# Patient Record
Sex: Female | Born: 1970
Health system: Southern US, Community
[De-identification: ages and names within clinical notes are randomized; demographics above are authoritative.]

## PROBLEM LIST (undated history)

## (undated) DIAGNOSIS — M2550 Pain in unspecified joint: Secondary | ICD-10-CM

## (undated) DIAGNOSIS — M6289 Other specified disorders of muscle: Secondary | ICD-10-CM

## (undated) DIAGNOSIS — M1990 Unspecified osteoarthritis, unspecified site: Secondary | ICD-10-CM

## (undated) DIAGNOSIS — I10 Essential (primary) hypertension: Secondary | ICD-10-CM

## (undated) DIAGNOSIS — G479 Sleep disorder, unspecified: Secondary | ICD-10-CM

## (undated) DIAGNOSIS — F439 Reaction to severe stress, unspecified: Secondary | ICD-10-CM

## (undated) DIAGNOSIS — M7989 Other specified soft tissue disorders: Secondary | ICD-10-CM

## (undated) DIAGNOSIS — R0602 Shortness of breath: Secondary | ICD-10-CM

## (undated) DIAGNOSIS — I809 Phlebitis and thrombophlebitis of unspecified site: Secondary | ICD-10-CM

## (undated) DIAGNOSIS — M25569 Pain in unspecified knee: Secondary | ICD-10-CM

## (undated) DIAGNOSIS — M549 Dorsalgia, unspecified: Secondary | ICD-10-CM

## (undated) DIAGNOSIS — E785 Hyperlipidemia, unspecified: Secondary | ICD-10-CM

## (undated) DIAGNOSIS — D649 Anemia, unspecified: Secondary | ICD-10-CM

## (undated) DIAGNOSIS — F32A Depression, unspecified: Secondary | ICD-10-CM

## (undated) DIAGNOSIS — R011 Cardiac murmur, unspecified: Secondary | ICD-10-CM

## (undated) DIAGNOSIS — K760 Fatty (change of) liver, not elsewhere classified: Secondary | ICD-10-CM

## (undated) DIAGNOSIS — G4730 Sleep apnea, unspecified: Secondary | ICD-10-CM

## (undated) DIAGNOSIS — K219 Gastro-esophageal reflux disease without esophagitis: Secondary | ICD-10-CM

## (undated) DIAGNOSIS — F419 Anxiety disorder, unspecified: Secondary | ICD-10-CM

## (undated) DIAGNOSIS — F329 Major depressive disorder, single episode, unspecified: Secondary | ICD-10-CM

## (undated) HISTORY — DX: Major depressive disorder, single episode, unspecified: F32.9

## (undated) HISTORY — DX: Pain in unspecified knee: M25.569

## (undated) HISTORY — DX: Gastro-esophageal reflux disease without esophagitis: K21.9

## (undated) HISTORY — DX: Depression, unspecified: F32.A

## (undated) HISTORY — DX: Sleep apnea, unspecified: G47.30

## (undated) HISTORY — DX: Phlebitis and thrombophlebitis of unspecified site: I80.9

## (undated) HISTORY — DX: Other specified soft tissue disorders: M79.89

## (undated) HISTORY — DX: Other specified disorders of muscle: M62.89

## (undated) HISTORY — DX: Hyperlipidemia, unspecified: E78.5

## (undated) HISTORY — DX: Reaction to severe stress, unspecified: F43.9

## (undated) HISTORY — PX: COLONOSCOPY: SHX174

## (undated) HISTORY — DX: Sleep disorder, unspecified: G47.9

## (undated) HISTORY — DX: Dorsalgia, unspecified: M54.9

## (undated) HISTORY — DX: Cardiac murmur, unspecified: R01.1

## (undated) HISTORY — DX: Shortness of breath: R06.02

## (undated) HISTORY — PX: WISDOM TOOTH EXTRACTION: SHX21

## (undated) HISTORY — DX: Pain in unspecified joint: M25.50

## (undated) HISTORY — DX: Anemia, unspecified: D64.9

## (undated) HISTORY — DX: Unspecified osteoarthritis, unspecified site: M19.90

## (undated) HISTORY — DX: Fatty (change of) liver, not elsewhere classified: K76.0

## (undated) HISTORY — PX: OVARY SURGERY: SHX727

---

## 1998-06-26 ENCOUNTER — Other Ambulatory Visit: Admission: RE | Admit: 1998-06-26 | Discharge: 1998-06-26 | Payer: Self-pay | Admitting: *Deleted

## 1998-10-06 ENCOUNTER — Inpatient Hospital Stay (HOSPITAL_COMMUNITY): Admission: AD | Admit: 1998-10-06 | Discharge: 1998-10-06 | Payer: Self-pay | Admitting: Obstetrics and Gynecology

## 1998-11-11 ENCOUNTER — Emergency Department (HOSPITAL_COMMUNITY): Admission: EM | Admit: 1998-11-11 | Discharge: 1998-11-11 | Payer: Self-pay | Admitting: Emergency Medicine

## 1999-01-15 ENCOUNTER — Inpatient Hospital Stay (HOSPITAL_COMMUNITY): Admission: AD | Admit: 1999-01-15 | Discharge: 1999-01-17 | Payer: Self-pay | Admitting: Anesthesiology

## 1999-01-18 ENCOUNTER — Encounter: Admission: RE | Admit: 1999-01-18 | Discharge: 1999-04-18 | Payer: Self-pay | Admitting: Obstetrics and Gynecology

## 1999-02-25 ENCOUNTER — Other Ambulatory Visit: Admission: RE | Admit: 1999-02-25 | Discharge: 1999-02-25 | Payer: Self-pay | Admitting: *Deleted

## 2000-10-25 ENCOUNTER — Ambulatory Visit (HOSPITAL_COMMUNITY): Admission: RE | Admit: 2000-10-25 | Discharge: 2000-10-25 | Payer: Self-pay | Admitting: Obstetrics and Gynecology

## 2000-10-25 ENCOUNTER — Encounter: Payer: Self-pay | Admitting: Obstetrics and Gynecology

## 2001-04-18 ENCOUNTER — Inpatient Hospital Stay (HOSPITAL_COMMUNITY): Admission: AD | Admit: 2001-04-18 | Discharge: 2001-04-18 | Payer: Self-pay | Admitting: Obstetrics and Gynecology

## 2001-04-20 ENCOUNTER — Inpatient Hospital Stay (HOSPITAL_COMMUNITY): Admission: AD | Admit: 2001-04-20 | Discharge: 2001-04-20 | Payer: Self-pay | Admitting: Obstetrics and Gynecology

## 2001-04-24 ENCOUNTER — Inpatient Hospital Stay (HOSPITAL_COMMUNITY): Admission: AD | Admit: 2001-04-24 | Discharge: 2001-04-25 | Payer: Self-pay | Admitting: Obstetrics and Gynecology

## 2001-04-24 ENCOUNTER — Encounter (INDEPENDENT_AMBULATORY_CARE_PROVIDER_SITE_OTHER): Payer: Self-pay

## 2001-04-26 ENCOUNTER — Encounter: Admission: RE | Admit: 2001-04-26 | Discharge: 2001-05-26 | Payer: Self-pay | Admitting: *Deleted

## 2001-05-30 ENCOUNTER — Other Ambulatory Visit: Admission: RE | Admit: 2001-05-30 | Discharge: 2001-05-30 | Payer: Self-pay | Admitting: *Deleted

## 2001-11-17 ENCOUNTER — Other Ambulatory Visit: Admission: RE | Admit: 2001-11-17 | Discharge: 2001-11-17 | Payer: Self-pay | Admitting: *Deleted

## 2002-07-12 ENCOUNTER — Other Ambulatory Visit: Admission: RE | Admit: 2002-07-12 | Discharge: 2002-07-12 | Payer: Self-pay | Admitting: *Deleted

## 2002-07-17 ENCOUNTER — Encounter: Payer: Self-pay | Admitting: *Deleted

## 2002-07-17 ENCOUNTER — Ambulatory Visit (HOSPITAL_COMMUNITY): Admission: RE | Admit: 2002-07-17 | Discharge: 2002-07-17 | Payer: Self-pay | Admitting: *Deleted

## 2003-01-17 ENCOUNTER — Emergency Department (HOSPITAL_COMMUNITY): Admission: EM | Admit: 2003-01-17 | Discharge: 2003-01-18 | Payer: Self-pay | Admitting: Emergency Medicine

## 2003-03-20 ENCOUNTER — Other Ambulatory Visit: Admission: RE | Admit: 2003-03-20 | Discharge: 2003-03-20 | Payer: Self-pay | Admitting: *Deleted

## 2003-05-22 ENCOUNTER — Ambulatory Visit (HOSPITAL_COMMUNITY): Admission: RE | Admit: 2003-05-22 | Discharge: 2003-05-22 | Payer: Self-pay | Admitting: Family Medicine

## 2004-03-03 ENCOUNTER — Emergency Department (HOSPITAL_COMMUNITY): Admission: EM | Admit: 2004-03-03 | Discharge: 2004-03-03 | Payer: Self-pay | Admitting: *Deleted

## 2004-08-06 ENCOUNTER — Other Ambulatory Visit: Admission: RE | Admit: 2004-08-06 | Discharge: 2004-08-06 | Payer: Self-pay | Admitting: *Deleted

## 2005-03-24 ENCOUNTER — Other Ambulatory Visit: Admission: RE | Admit: 2005-03-24 | Discharge: 2005-03-24 | Payer: Self-pay | Admitting: Family Medicine

## 2005-10-21 ENCOUNTER — Emergency Department (HOSPITAL_COMMUNITY): Admission: EM | Admit: 2005-10-21 | Discharge: 2005-10-21 | Payer: Self-pay | Admitting: Emergency Medicine

## 2005-10-21 ENCOUNTER — Ambulatory Visit: Payer: Self-pay | Admitting: Family Medicine

## 2005-12-09 ENCOUNTER — Ambulatory Visit: Payer: Self-pay | Admitting: Family Medicine

## 2006-01-20 ENCOUNTER — Ambulatory Visit: Payer: Self-pay | Admitting: Internal Medicine

## 2006-07-12 ENCOUNTER — Ambulatory Visit: Payer: Self-pay | Admitting: Family Medicine

## 2006-07-12 LAB — CONVERTED CEMR LAB
Basophils Absolute: 0 10*3/uL (ref 0.0–0.1)
Basophils Relative: 0.5 % (ref 0.0–1.0)
Cholesterol: 209 mg/dL (ref 0–200)
Direct LDL: 111 mg/dL
Eosinophils Absolute: 0.1 10*3/uL (ref 0.0–0.6)
Eosinophils Relative: 0.8 % (ref 0.0–5.0)
Glucose, Bld: 84 mg/dL (ref 70–99)
HCT: 43.8 % (ref 36.0–46.0)
HDL: 70.7 mg/dL (ref >39.0)
Hemoglobin: 15.2 g/dL — ABNORMAL HIGH (ref 12.0–15.0)
Lymphocytes Relative: 20.9 % (ref 12.0–46.0)
MCHC: 34.6 g/dL (ref 30.0–36.0)
MCV: 93.4 fL (ref 78.0–100.0)
Monocytes Absolute: 0.3 10*3/uL (ref 0.2–0.7)
Monocytes Relative: 3.3 % (ref 3.0–11.0)
Neutro Abs: 6.7 10*3/uL (ref 1.4–7.7)
Neutrophils Relative %: 74.5 % (ref 43.0–77.0)
Platelets: 266 10*3/uL (ref 150–400)
RBC: 4.7 M/uL (ref 3.87–5.11)
RDW: 11.8 % (ref 11.5–14.6)
TSH: 2.36 microintl units/mL (ref 0.35–5.50)
Total CHOL/HDL Ratio: 3
Triglycerides: 189 mg/dL — ABNORMAL HIGH (ref 0–149)
VLDL: 38 mg/dL (ref 0–40)
WBC: 9 10*3/uL (ref 4.5–10.5)

## 2006-07-26 DIAGNOSIS — F32A Depression, unspecified: Secondary | ICD-10-CM

## 2006-07-26 DIAGNOSIS — F419 Anxiety disorder, unspecified: Secondary | ICD-10-CM

## 2006-07-26 DIAGNOSIS — F329 Major depressive disorder, single episode, unspecified: Secondary | ICD-10-CM

## 2006-07-26 HISTORY — DX: Depression, unspecified: F32.A

## 2008-05-21 ENCOUNTER — Emergency Department (HOSPITAL_COMMUNITY): Admission: EM | Admit: 2008-05-21 | Discharge: 2008-05-21 | Payer: Self-pay | Admitting: Family Medicine

## 2010-07-03 LAB — POCT I-STAT, CHEM 8
BUN: 8 mg/dL (ref 6–23)
Calcium, Ion: 1.13 mmol/L (ref 1.12–1.32)
Chloride: 101 mEq/L (ref 96–112)
Creatinine, Ser: 0.7 mg/dL (ref 0.4–1.2)
Glucose, Bld: 92 mg/dL (ref 70–99)
HCT: 47 % — ABNORMAL HIGH (ref 36.0–46.0)
Hemoglobin: 16 g/dL — ABNORMAL HIGH (ref 12.0–15.0)
Potassium: 3.7 mEq/L (ref 3.5–5.1)
Sodium: 137 mEq/L (ref 135–145)
TCO2: 27 mmol/L (ref 0–100)

## 2010-07-03 LAB — POCT CARDIAC MARKERS: Myoglobin, poc: 47.9 ng/mL (ref 12–200)

## 2010-07-03 LAB — RAPID URINE DRUG SCREEN, HOSP PERFORMED
Amphetamines: NOT DETECTED
Barbiturates: NOT DETECTED
Benzodiazepines: NOT DETECTED
Cocaine: NOT DETECTED
Opiates: NOT DETECTED
Tetrahydrocannabinol: NOT DETECTED

## 2010-08-08 NOTE — Assessment & Plan Note (Signed)
[facility]                                   ON-CALL NOTE   EMOJEAN, GERTZ                     MRN:          [id number]  DATE:10/20/2005                            DOB:          [birth year]    TELEPHONE MESSAGE:  Husband calling at 7:30 a.m. because his wife has some  skipping of her heart rate.  Advised to take her to the office for eval.                                   Tinnie Gens A. Tawanna Cooler, MD   JAT/MedQ  DD:  10/22/2005  DT:  10/22/2005  Job #:  [id number]   cc:   Leanne Chang, MD

## 2010-08-08 NOTE — Assessment & Plan Note (Signed)
[facility]                                   ON-CALL NOTE   Angela Stark, Angela Stark                   MRN:          [id number]  DATE:01/20/2006                            DOB:          18-Nov-1970    ON-CALL MESSAGE   Dr. Leta Jungling patient, [phone].  I think she is 40 years old.  Brynda Greathouse, her  husband, calls.  The patient was seen by Dr. Drue Novel and was diagnosed with  bronchitis,  was given azithromycin, and a Ventolin inhaler.  Since that  time, she apparently has not improved.  There is no associated fever, but  has coughing fits and keeps complaining to her husband of being short of  breath.  She discouraged calling the on-call physician, but her husband was  concerned because she kept saying she was short of breath.  He states that  her color looks pretty good, but she complains of being short of breath.  She has no history of asthma but she does smoke regularly and does have a  child with mild asthma.  In fact, they have a nebulizer at home with  albuterol, but she has not used that for herself.  When talking to her, she  is able to speak.  She is mildly hoarse, but not breathless.  I have  discussed with her, her symptoms.  She can use a nebulizer and if her  shortness of breath is continuing or getting worse, she should be evaluated  in the emergency room tonight.  Otherwise, she should follow up with Dr. Drue Novel  and agree with her decision to actually stop smoking.    ______________________________  Neta Mends. Fabian Sharp, MD    WKP/MedQ  DD: 01/20/2006  DT: 01/21/2006  Job #: [id number]   cc:   Willow Ora, MD

## 2010-10-06 ENCOUNTER — Other Ambulatory Visit: Payer: Self-pay | Admitting: Family Medicine

## 2010-10-06 DIAGNOSIS — N6452 Nipple discharge: Secondary | ICD-10-CM

## 2010-10-06 DIAGNOSIS — N6310 Unspecified lump in the right breast, unspecified quadrant: Secondary | ICD-10-CM

## 2010-10-08 ENCOUNTER — Ambulatory Visit
Admission: RE | Admit: 2010-10-08 | Discharge: 2010-10-08 | Disposition: A | Discharge status: Home / Self Care | Payer: BC Managed Care – PPO | Source: Ambulatory Visit | Attending: Family Medicine | Admitting: Family Medicine

## 2010-10-08 ENCOUNTER — Other Ambulatory Visit: Payer: Self-pay | Admitting: Family Medicine

## 2010-10-08 DIAGNOSIS — N6310 Unspecified lump in the right breast, unspecified quadrant: Secondary | ICD-10-CM

## 2010-10-08 DIAGNOSIS — N6452 Nipple discharge: Secondary | ICD-10-CM

## 2010-10-13 ENCOUNTER — Other Ambulatory Visit: Payer: Self-pay | Admitting: Family Medicine

## 2010-10-13 DIAGNOSIS — N6452 Nipple discharge: Secondary | ICD-10-CM

## 2010-10-13 DIAGNOSIS — N6009 Solitary cyst of unspecified breast: Secondary | ICD-10-CM

## 2010-11-28 ENCOUNTER — Inpatient Hospital Stay: Admission: RE | Admit: 2010-11-28 | Payer: BC Managed Care – PPO | Source: Ambulatory Visit

## 2011-08-17 ENCOUNTER — Encounter (HOSPITAL_COMMUNITY): Payer: Self-pay | Admitting: *Deleted

## 2011-08-17 ENCOUNTER — Inpatient Hospital Stay (HOSPITAL_COMMUNITY)
Admission: EM | Admit: 2011-08-17 | Discharge: 2011-08-21 | DRG: 433 | Disposition: A | Discharge status: Home / Self Care | Payer: Self-pay | Attending: Family Medicine | Admitting: Family Medicine

## 2011-08-17 DIAGNOSIS — F1010 Alcohol abuse, uncomplicated: Secondary | ICD-10-CM | POA: Diagnosis present

## 2011-08-17 DIAGNOSIS — E876 Hypokalemia: Secondary | ICD-10-CM | POA: Diagnosis present

## 2011-08-17 DIAGNOSIS — E860 Dehydration: Secondary | ICD-10-CM

## 2011-08-17 DIAGNOSIS — D696 Thrombocytopenia, unspecified: Secondary | ICD-10-CM | POA: Diagnosis present

## 2011-08-17 DIAGNOSIS — N179 Acute kidney failure, unspecified: Secondary | ICD-10-CM | POA: Diagnosis present

## 2011-08-17 DIAGNOSIS — E871 Hypo-osmolality and hyponatremia: Secondary | ICD-10-CM | POA: Diagnosis present

## 2011-08-17 DIAGNOSIS — F1920 Other psychoactive substance dependence, uncomplicated: Secondary | ICD-10-CM | POA: Diagnosis present

## 2011-08-17 DIAGNOSIS — F329 Major depressive disorder, single episode, unspecified: Secondary | ICD-10-CM | POA: Diagnosis present

## 2011-08-17 DIAGNOSIS — K7010 Alcoholic hepatitis without ascites: Secondary | ICD-10-CM

## 2011-08-17 DIAGNOSIS — F1020 Alcohol dependence, uncomplicated: Secondary | ICD-10-CM | POA: Diagnosis present

## 2011-08-17 DIAGNOSIS — R109 Unspecified abdominal pain: Secondary | ICD-10-CM | POA: Diagnosis present

## 2011-08-17 DIAGNOSIS — F10931 Alcohol use, unspecified with withdrawal delirium: Secondary | ICD-10-CM | POA: Diagnosis not present

## 2011-08-17 DIAGNOSIS — F1120 Opioid dependence, uncomplicated: Secondary | ICD-10-CM | POA: Diagnosis present

## 2011-08-17 DIAGNOSIS — D72819 Decreased white blood cell count, unspecified: Secondary | ICD-10-CM | POA: Diagnosis present

## 2011-08-17 DIAGNOSIS — I10 Essential (primary) hypertension: Secondary | ICD-10-CM | POA: Diagnosis present

## 2011-08-17 DIAGNOSIS — T65891A Toxic effect of other specified substances, accidental (unintentional), initial encounter: Secondary | ICD-10-CM | POA: Diagnosis present

## 2011-08-17 DIAGNOSIS — F3289 Other specified depressive episodes: Secondary | ICD-10-CM | POA: Diagnosis present

## 2011-08-17 DIAGNOSIS — T510X4A Toxic effect of ethanol, undetermined, initial encounter: Secondary | ICD-10-CM | POA: Diagnosis present

## 2011-08-17 DIAGNOSIS — D6959 Other secondary thrombocytopenia: Secondary | ICD-10-CM | POA: Diagnosis present

## 2011-08-17 DIAGNOSIS — F17200 Nicotine dependence, unspecified, uncomplicated: Secondary | ICD-10-CM | POA: Diagnosis present

## 2011-08-17 DIAGNOSIS — F411 Generalized anxiety disorder: Secondary | ICD-10-CM | POA: Diagnosis present

## 2011-08-17 DIAGNOSIS — K7030 Alcoholic cirrhosis of liver without ascites: Secondary | ICD-10-CM | POA: Diagnosis present

## 2011-08-17 DIAGNOSIS — F10231 Alcohol dependence with withdrawal delirium: Secondary | ICD-10-CM | POA: Diagnosis present

## 2011-08-17 HISTORY — DX: Anxiety disorder, unspecified: F41.9

## 2011-08-17 HISTORY — DX: Essential (primary) hypertension: I10

## 2011-08-17 LAB — CBC
Hemoglobin: 13.8 g/dL (ref 12.0–15.0)
MCH: 36.1 pg — ABNORMAL HIGH (ref 26.0–34.0)
MCV: 102.1 fL — ABNORMAL HIGH (ref 78.0–100.0)
Platelets: 90 10*3/uL — ABNORMAL LOW (ref 150–400)
RBC: 3.82 MIL/uL — ABNORMAL LOW (ref 3.87–5.11)
WBC: 3.9 10*3/uL — ABNORMAL LOW (ref 4.0–10.5)

## 2011-08-17 LAB — COMPREHENSIVE METABOLIC PANEL
ALT: 108 U/L — ABNORMAL HIGH (ref 0–35)
AST: 205 U/L — ABNORMAL HIGH (ref 0–37)
CO2: 27 mEq/L (ref 19–32)
Calcium: 8.5 mg/dL (ref 8.4–10.5)
Chloride: 76 mEq/L — ABNORMAL LOW (ref 96–112)
Creatinine, Ser: 2.36 mg/dL — ABNORMAL HIGH (ref 0.50–1.10)
GFR calc Af Amer: 29 mL/min — ABNORMAL LOW (ref >90)
GFR calc non Af Amer: 25 mL/min — ABNORMAL LOW (ref >90)
Glucose, Bld: 130 mg/dL — ABNORMAL HIGH (ref 70–99)
Sodium: 123 mEq/L — ABNORMAL LOW (ref 135–145)
Total Bilirubin: 8.8 mg/dL — ABNORMAL HIGH (ref 0.3–1.2)

## 2011-08-17 LAB — DIFFERENTIAL
Basophils Relative: 0 % (ref 0–1)
Eosinophils Relative: 1 % (ref 0–5)
Lymphs Abs: 1 10*3/uL (ref 0.7–4.0)
Monocytes Absolute: 0.4 10*3/uL (ref 0.1–1.0)
Monocytes Relative: 11 % (ref 3–12)

## 2011-08-17 LAB — URINALYSIS, ROUTINE W REFLEX MICROSCOPIC
Nitrite: NEGATIVE
Protein, ur: 30 mg/dL — AB
Urobilinogen, UA: 2 mg/dL — ABNORMAL HIGH (ref 0.0–1.0)

## 2011-08-17 LAB — RAPID URINE DRUG SCREEN, HOSP PERFORMED
Amphetamines: NOT DETECTED
Barbiturates: NOT DETECTED
Cocaine: NOT DETECTED
Opiates: NOT DETECTED
Tetrahydrocannabinol: NOT DETECTED

## 2011-08-17 LAB — URINE MICROSCOPIC-ADD ON

## 2011-08-17 MED ORDER — POTASSIUM CHLORIDE 10 MEQ/100ML IV SOLN
10.0000 meq | Freq: Once | INTRAVENOUS | Status: AC
  Administered 2011-08-18: 10 meq via INTRAVENOUS
  Filled 2011-08-17: qty 100

## 2011-08-17 MED ORDER — LORAZEPAM 1 MG PO TABS
1.0000 mg | ORAL_TABLET | Freq: Four times a day (QID) | ORAL | Status: DC | PRN
  Administered 2011-08-18: 1 mg via ORAL
  Filled 2011-08-17 (×2): qty 1

## 2011-08-17 MED ORDER — ONDANSETRON HCL 4 MG/2ML IJ SOLN
4.0000 mg | Freq: Once | INTRAMUSCULAR | Status: AC
  Administered 2011-08-17: 4 mg via INTRAVENOUS
  Filled 2011-08-17: qty 2

## 2011-08-17 MED ORDER — THIAMINE HCL 100 MG/ML IJ SOLN
100.0000 mg | Freq: Every day | INTRAMUSCULAR | Status: DC
  Filled 2011-08-17: qty 1

## 2011-08-17 MED ORDER — LORAZEPAM 1 MG PO TABS
0.0000 mg | ORAL_TABLET | Freq: Two times a day (BID) | ORAL | Status: DC
Start: 2011-08-19 — End: 2011-08-19

## 2011-08-17 MED ORDER — ADULT MULTIVITAMIN W/MINERALS CH
1.0000 | ORAL_TABLET | Freq: Every day | ORAL | Status: DC
  Administered 2011-08-18 – 2011-08-21 (×4): 1 via ORAL
  Filled 2011-08-17 (×4): qty 1

## 2011-08-17 MED ORDER — DEXTROSE-NACL 5-0.45 % IV SOLN: INTRAVENOUS | Status: DC

## 2011-08-17 MED ORDER — VITAMIN B-1 100 MG PO TABS
100.0000 mg | ORAL_TABLET | Freq: Every day | ORAL | Status: DC
  Administered 2011-08-18: 100 mg via ORAL
  Filled 2011-08-17 (×2): qty 1

## 2011-08-17 MED ORDER — POTASSIUM CHLORIDE 10 MEQ/100ML IV SOLN
10.0000 meq | Freq: Once | INTRAVENOUS | Status: AC
  Administered 2011-08-17: 10 meq via INTRAVENOUS
  Filled 2011-08-17: qty 100

## 2011-08-17 MED ORDER — KCL IN DEXTROSE-NACL 20-5-0.45 MEQ/L-%-% IV SOLN
Freq: Once | INTRAVENOUS | Status: AC
  Administered 2011-08-18: 02:00:00 via INTRAVENOUS
  Filled 2011-08-17: qty 1000

## 2011-08-17 MED ORDER — SODIUM CHLORIDE 0.9 % IV SOLN
Freq: Once | INTRAVENOUS | Status: AC
  Administered 2011-08-17: via INTRAVENOUS

## 2011-08-17 MED ORDER — FOLIC ACID 1 MG PO TABS
1.0000 mg | ORAL_TABLET | Freq: Every day | ORAL | Status: DC
  Administered 2011-08-18: 1 mg via ORAL
  Filled 2011-08-17: qty 1

## 2011-08-17 MED ORDER — LORAZEPAM 1 MG PO TABS
0.0000 mg | ORAL_TABLET | Freq: Four times a day (QID) | ORAL | Status: DC
  Administered 2011-08-18: 1 mg via ORAL
  Filled 2011-08-17: qty 1

## 2011-08-17 MED ORDER — SODIUM CHLORIDE 0.9 % IV BOLUS (SEPSIS)
1000.0000 mL | Freq: Once | INTRAVENOUS | Status: AC
  Administered 2011-08-17: 1000 mL via INTRAVENOUS

## 2011-08-17 MED ORDER — LORAZEPAM 2 MG/ML IJ SOLN
1.0000 mg | Freq: Four times a day (QID) | INTRAMUSCULAR | Status: DC | PRN
  Administered 2011-08-19: 1 mg via INTRAVENOUS
  Filled 2011-08-17: qty 1
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
## 2011-08-17 NOTE — ED Provider Notes (Signed)
History     CSN: [id number]  Arrival date & time 08/17/11  1705   First MD Initiated Contact with Patient 08/17/11 2051      Chief Complaint  Patient presents with  . Abdominal Pain  . Dysuria    (Consider location/radiation/quality/duration/timing/severity/associated sxs/prior treatment) Patient is a 41 y.o. female presenting with abdominal pain and dysuria. The history is provided by the patient.  Abdominal Pain The primary symptoms of the illness include abdominal pain and dysuria.  Dysuria   pt with abd pain and vomiting x 3 days--h/o heavy etoh abuse, notes epigastric pain and diarrhea--also with scleral icterus without fever--nothing makes sx better or worse, no meds taken pta, no h/o same  Past Medical History  Diagnosis Date  . Hypertension   . Anxiety     Past Surgical History  Procedure Date  . Ovary surgery     No family history on file.  History  Substance Use Topics  . Smoking status: Current Some Day Smoker  . Smokeless tobacco: Not on file  . Alcohol Use: Yes     frequent drinks states she is an alcoholic    OB History    Grav Para Term Preterm Abortions TAB SAB Ect Mult Living                  Review of Systems  Gastrointestinal: Positive for abdominal pain.  Genitourinary: Positive for dysuria.  All other systems reviewed and are negative.    Allergies  Review of patient's allergies indicates no known allergies.  Home Medications  No current outpatient prescriptions on file.  BP 141/91  Pulse 83  Temp(Src) 98.6 F (37 C) (Oral)  Resp 20  SpO2 98%  LMP 06/22/2011  Physical Exam  Nursing note and vitals reviewed. Constitutional: She is oriented to person, place, and time. She appears well-developed and well-nourished.  Non-toxic appearance. No distress.  HENT:  Head: Normocephalic and atraumatic.  Eyes: Conjunctivae, EOM and lids are normal. Pupils are equal, round, and reactive to light. Scleral icterus is present.  Neck:  Normal range of motion. Neck supple. No tracheal deviation present. No mass present.  Cardiovascular: Normal rate, regular rhythm and normal heart sounds.  Exam reveals no gallop.   No murmur heard. Pulmonary/Chest: Effort normal and breath sounds normal. No stridor. No respiratory distress. She has no decreased breath sounds. She has no wheezes. She has no rhonchi. She has no rales.  Abdominal: Soft. Normal appearance and bowel sounds are normal. She exhibits no distension. There is no tenderness. There is no rebound and no CVA tenderness.  Musculoskeletal: Normal range of motion. She exhibits no edema and no tenderness.  Neurological: She is alert and oriented to person, place, and time. She has normal strength. No cranial nerve deficit or sensory deficit. GCS eye subscore is 4. GCS verbal subscore is 5. GCS motor subscore is 6.  Skin: Skin is warm and dry. No abrasion and no rash noted.  Psychiatric: She has a normal mood and affect. Her speech is normal and behavior is normal.    ED Course  Procedures (including critical care time)  Labs Reviewed  URINALYSIS, ROUTINE W REFLEX MICROSCOPIC - Abnormal; Notable for the following:    Color, Urine ORANGE (*) BIOCHEMICALS MAY BE AFFECTED BY COLOR   APPearance CLOUDY (*)    Hgb urine dipstick SMALL (*)    Bilirubin Urine LARGE (*)    Ketones, ur TRACE (*)    Protein, ur 30 (*)  Urobilinogen, UA 2.0 (*)    Leukocytes, UA TRACE (*)    All other components within normal limits  POCT PREGNANCY, URINE  URINE MICROSCOPIC-ADD ON  CBC  DIFFERENTIAL  COMPREHENSIVE METABOLIC PANEL  LIPASE, BLOOD  PROTIME-INR  APTT  ETHANOL  URINE RAPID DRUG SCREEN (HOSP PERFORMED)   No results found.   No diagnosis found.    MDM  Patient given IV fluids. Potassium ordered. Spoke with hospitalist and patient be admitted        Toy Baker, MD 08/17/11 2339

## 2011-08-18 ENCOUNTER — Inpatient Hospital Stay (HOSPITAL_COMMUNITY): Payer: Self-pay

## 2011-08-18 ENCOUNTER — Encounter (HOSPITAL_COMMUNITY): Payer: Self-pay | Admitting: Internal Medicine

## 2011-08-18 DIAGNOSIS — K7010 Alcoholic hepatitis without ascites: Secondary | ICD-10-CM

## 2011-08-18 DIAGNOSIS — E876 Hypokalemia: Secondary | ICD-10-CM | POA: Diagnosis present

## 2011-08-18 DIAGNOSIS — E871 Hypo-osmolality and hyponatremia: Secondary | ICD-10-CM

## 2011-08-18 DIAGNOSIS — N179 Acute kidney failure, unspecified: Secondary | ICD-10-CM | POA: Diagnosis present

## 2011-08-18 DIAGNOSIS — D696 Thrombocytopenia, unspecified: Secondary | ICD-10-CM

## 2011-08-18 DIAGNOSIS — E860 Dehydration: Secondary | ICD-10-CM | POA: Diagnosis present

## 2011-08-18 DIAGNOSIS — F1120 Opioid dependence, uncomplicated: Secondary | ICD-10-CM

## 2011-08-18 HISTORY — DX: Opioid dependence, uncomplicated: F11.20

## 2011-08-18 HISTORY — DX: Dehydration: E86.0

## 2011-08-18 HISTORY — DX: Acute kidney failure, unspecified: N17.9

## 2011-08-18 HISTORY — DX: Thrombocytopenia, unspecified: D69.6

## 2011-08-18 HISTORY — DX: Alcoholic hepatitis without ascites: K70.10

## 2011-08-18 HISTORY — DX: Hypo-osmolality and hyponatremia: E87.1

## 2011-08-18 HISTORY — DX: Hypokalemia: E87.6

## 2011-08-18 LAB — CBC
MCHC: 35.5 g/dL (ref 30.0–36.0)
Platelets: 85 10*3/uL — ABNORMAL LOW (ref 150–400)
RDW: 13.9 % (ref 11.5–15.5)
WBC: 2.9 10*3/uL — ABNORMAL LOW (ref 4.0–10.5)

## 2011-08-18 LAB — BASIC METABOLIC PANEL
BUN: 10 mg/dL (ref 6–23)
CO2: 28 mEq/L (ref 19–32)
Calcium: 7.5 mg/dL — ABNORMAL LOW (ref 8.4–10.5)
Calcium: 7.7 mg/dL — ABNORMAL LOW (ref 8.4–10.5)
Chloride: 84 mEq/L — ABNORMAL LOW (ref 96–112)
Creatinine, Ser: 2.04 mg/dL — ABNORMAL HIGH (ref 0.50–1.10)
Creatinine, Ser: 1.86 mg/dL — ABNORMAL HIGH (ref 0.50–1.10)
GFR calc Af Amer: 34 mL/min — ABNORMAL LOW (ref >90)
GFR calc non Af Amer: 29 mL/min — ABNORMAL LOW (ref >90)

## 2011-08-18 LAB — MRSA PCR SCREENING: MRSA by PCR: NEGATIVE

## 2011-08-18 MED ORDER — MORPHINE SULFATE 2 MG/ML IJ SOLN
2.0000 mg | INTRAMUSCULAR | Status: DC | PRN
  Administered 2011-08-19 – 2011-08-20 (×2): 2 mg via INTRAVENOUS
  Filled 2011-08-18 (×2): qty 1

## 2011-08-18 MED ORDER — ONDANSETRON HCL 4 MG/2ML IJ SOLN: 4.0000 mg | Freq: Four times a day (QID) | INTRAMUSCULAR | Status: DC | PRN

## 2011-08-18 MED ORDER — POTASSIUM CHLORIDE 20 MEQ/15ML (10%) PO LIQD: 40.0000 meq | Freq: Once | ORAL | Status: DC

## 2011-08-18 MED ORDER — POTASSIUM CHLORIDE CRYS ER 20 MEQ PO TBCR
40.0000 meq | EXTENDED_RELEASE_TABLET | Freq: Every day | ORAL | Status: DC
  Administered 2011-08-18: 40 meq via ORAL
  Filled 2011-08-18 (×2): qty 2

## 2011-08-18 MED ORDER — SODIUM CHLORIDE 0.9 % IJ SOLN
3.0000 mL | Freq: Two times a day (BID) | INTRAMUSCULAR | Status: DC
  Administered 2011-08-18: 3 mL via INTRAVENOUS

## 2011-08-18 MED ORDER — PANTOPRAZOLE SODIUM 40 MG PO TBEC
40.0000 mg | DELAYED_RELEASE_TABLET | Freq: Every day | ORAL | Status: DC
  Administered 2011-08-18 – 2011-08-21 (×4): 40 mg via ORAL
  Filled 2011-08-18 (×5): qty 1

## 2011-08-18 MED ORDER — ONDANSETRON HCL 4 MG PO TABS: 4.0000 mg | ORAL_TABLET | Freq: Four times a day (QID) | ORAL | Status: DC | PRN

## 2011-08-18 MED ORDER — FOLIC ACID 1 MG PO TABS
1.0000 mg | ORAL_TABLET | Freq: Every day | ORAL | Status: DC
  Administered 2011-08-18: 1 mg via ORAL
  Filled 2011-08-18 (×2): qty 1

## 2011-08-18 MED ORDER — POTASSIUM CHLORIDE 10 MEQ/100ML IV SOLN
10.0000 meq | INTRAVENOUS | Status: AC
  Administered 2011-08-18 (×2): 10 meq via INTRAVENOUS
  Filled 2011-08-18 (×3): qty 100

## 2011-08-18 MED ORDER — PNEUMOCOCCAL VAC POLYVALENT 25 MCG/0.5ML IJ INJ
0.5000 mL | INJECTION | INTRAMUSCULAR | Status: AC
  Administered 2011-08-19: 0.5 mL via INTRAMUSCULAR
  Filled 2011-08-18: qty 0.5

## 2011-08-18 MED ORDER — IBUPROFEN 800 MG PO TABS
800.0000 mg | ORAL_TABLET | Freq: Three times a day (TID) | ORAL | Status: DC | PRN
  Administered 2011-08-18: 800 mg via ORAL
  Filled 2011-08-18: qty 1

## 2011-08-18 MED ORDER — POTASSIUM CHLORIDE CRYS ER 20 MEQ PO TBCR
20.0000 meq | EXTENDED_RELEASE_TABLET | Freq: Once | ORAL | Status: AC
  Administered 2011-08-18: 20 meq via ORAL
  Filled 2011-08-18: qty 1

## 2011-08-18 NOTE — H&P (Signed)
PCP:   No primary provider on file.   Chief Complaint: Jaundice and abdominal pain.   HPI: Angela Stark is an 41 y.o. female with history of hypertension, anxiety and depression, significant alcohol use for many years, prior polysubstance abuse including cocaine use, presents to the emergency room and she noted that her sclera is yellow and that she has abdominal discomfort. She denies fever, chills, cough, dysuria or polyuria. She did admit to having nausea. She also has diarrhea that is rather chronic. She does not have a primary care physician. Because of lack of insurance, she has not seek much medical help. Evaluation in emergency room included a creatinine of 2.36, with serum sodium of 123, potassium of 2.3. Her liver function tests were quite elevated with AST in the 200's and ALT in the 100s. She also has total bili of 8.8. Fortunately she still has normal albumin and normal INR. Her hemoglobin is 13.8 g per decaliter with MCV of 102, and platelet count of 90,000. Her urinalysis showed urobilinogen, but no evidence of infection. Hospitalist was asked to admit her for acute renal failure, alcoholic hepatitis, thrombocytopenia likely from alcohol, dehydration, and severe electrolyte abnormality.  Rewiew of Systems:  The patient denies anorexia, fever, weight loss,, vision loss, decreased hearing, hoarseness, chest pain, syncope, dyspnea on exertion, peripheral edema, balance deficits, hemoptysis, abdominal pain, melena, hematochezia, severe indigestion/heartburn, hematuria, incontinence, genital sores, muscle weakness, suspicious skin lesions, transient blindness, difficulty walking, depression, unusual weight change, abnormal bleeding, enlarged lymph nodes, angioedema, and breast masses.    Past Medical History  Diagnosis Date  . Hypertension   . Anxiety     Past Surgical History  Procedure Date  . Ovary surgery     Medications:  HOME MEDS: Prior to Admission medications   Not  on File     Allergies:  No Known Allergies  Social History:   reports that she has been smoking.  She does not have any smokeless tobacco history on file. She reports that she drinks alcohol. She reports that she does not use illicit drugs.  Family History: No family history on file.   Physical Exam: Filed Vitals:   08/17/11 1729 08/17/11 2314 08/18/11 0026  BP: 141/91 129/71 131/86  Pulse: 83 80 68  Temp: 98.6 F (37 C) 98 F (36.7 C)   TempSrc: Oral Oral   Resp: 20 22   SpO2: 98% 97%    Blood pressure 131/86, pulse 68, temperature 98 F (36.7 C), temperature source Oral, resp. rate 22, last menstrual period 06/22/2011, SpO2 97.00%.  GEN:  Pleasant  person lying in the stretcher in no acute distress; cooperative with exam PSYCH:  alert and oriented x4; does not appear anxious or depressed; affect is appropriate. HEENT: Mucous membranes pink and sclerae is icteric; PERRLA; EOM intact; no cervical lymphadenopathy nor thyromegaly or carotid bruit; no JVD; Breasts:: Not examined CHEST WALL: No tenderness CHEST: Normal respiration, clear to auscultation bilaterally HEART: Regular rate and rhythm; no murmurs rubs or gallops BACK: No kyphosis or scoliosis; no CVA tenderness ABDOMEN: Obese, soft non-tender; no masses, no organomegaly, normal abdominal bowel sounds; no pannus; no intertriginous candida. Rectal Exam: Not done EXTREMITIES: No bone or joint deformity; age-appropriate arthropathy of the hands and knees; no edema; no ulcerations. Genitalia: not examined PULSES: 2+ and symmetric SKIN: Normal hydration no rash or ulceration CNS: Cranial nerves 2-12 grossly intact no focal lateralizing neurologic deficit. She is tremulous. No evidence of psychosis   Labs & Imaging Results for  orders placed during the hospital encounter of 08/17/11 (from the past 48 hour(s))  URINALYSIS, ROUTINE W REFLEX MICROSCOPIC     Status: Abnormal   Collection Time   08/17/11  5:43 PM       Component Value Range Comment   Color, Urine ORANGE (*) YELLOW  BIOCHEMICALS MAY BE AFFECTED BY COLOR   APPearance CLOUDY (*) CLEAR     Specific Gravity, Urine 1.016  1.005 - 1.030     pH 5.5  5.0 - 8.0     Glucose, UA NEGATIVE  NEGATIVE (mg/dL)    Hgb urine dipstick SMALL (*) NEGATIVE     Bilirubin Urine LARGE (*) NEGATIVE     Ketones, ur TRACE (*) NEGATIVE (mg/dL)    Protein, ur 30 (*) NEGATIVE (mg/dL)    Urobilinogen, UA 2.0 (*) 0.0 - 1.0 (mg/dL)    Nitrite NEGATIVE  NEGATIVE     Leukocytes, UA TRACE (*) NEGATIVE    URINE MICROSCOPIC-ADD ON     Status: Normal   Collection Time   08/17/11  5:43 PM      Component Value Range Comment   Squamous Epithelial / LPF RARE  RARE     WBC, UA 0-2  <3 (WBC/hpf)    Bacteria, UA RARE  RARE    URINE RAPID DRUG SCREEN (HOSP PERFORMED)     Status: Normal   Collection Time   08/17/11  5:43 PM      Component Value Range Comment   Opiates NONE DETECTED  NONE DETECTED     Cocaine NONE DETECTED  NONE DETECTED     Benzodiazepines NONE DETECTED  NONE DETECTED     Amphetamines NONE DETECTED  NONE DETECTED     Tetrahydrocannabinol NONE DETECTED  NONE DETECTED     Barbiturates NONE DETECTED  NONE DETECTED    POCT PREGNANCY, URINE     Status: Normal   Collection Time   08/17/11  5:50 PM      Component Value Range Comment   Preg Test, Ur NEGATIVE  NEGATIVE    CBC     Status: Abnormal   Collection Time   08/17/11  9:40 PM      Component Value Range Comment   WBC 3.9 (*) 4.0 - 10.5 (K/uL)    RBC 3.82 (*) 3.87 - 5.11 (MIL/uL)    Hemoglobin 13.8  12.0 - 15.0 (g/dL)    HCT 40.9  81.1 - 91.4 (%)    MCV 102.1 (*) 78.0 - 100.0 (fL)    MCH 36.1 (*) 26.0 - 34.0 (pg)    MCHC 35.4  30.0 - 36.0 (g/dL)    RDW 78.2  95.6 - 21.3 (%)    Platelets 90 (*) 150 - 400 (K/uL)   DIFFERENTIAL     Status: Normal   Collection Time   08/17/11  9:40 PM      Component Value Range Comment   Neutrophils Relative 63  43 - 77 (%)    Lymphocytes Relative 25  12 - 46 (%)     Monocytes Relative 11  3 - 12 (%)    Eosinophils Relative 1  0 - 5 (%)    Basophils Relative 0  0 - 1 (%)    Neutro Abs 2.5  1.7 - 7.7 (K/uL)    Lymphs Abs 1.0  0.7 - 4.0 (K/uL)    Monocytes Absolute 0.4  0.1 - 1.0 (K/uL)    Eosinophils Absolute 0.0  0.0 - 0.7 (K/uL)    Basophils Absolute  0.0  0.0 - 0.1 (K/uL)    Smear Review MORPHOLOGY UNREMARKABLE     COMPREHENSIVE METABOLIC PANEL     Status: Abnormal   Collection Time   08/17/11  9:40 PM      Component Value Range Comment   Sodium 123 (*) 135 - 145 (mEq/L)    Potassium 2.3 (*) 3.5 - 5.1 (mEq/L)    Chloride 76 (*) 96 - 112 (mEq/L)    CO2 27  19 - 32 (mEq/L)    Glucose, Bld 130 (*) 70 - 99 (mg/dL)    BUN 11  6 - 23 (mg/dL)    Creatinine, Ser 1.61 (*) 0.50 - 1.10 (mg/dL)    Calcium 8.5  8.4 - 10.5 (mg/dL)    Total Protein 6.4  6.0 - 8.3 (g/dL)    Albumin 3.6  3.5 - 5.2 (g/dL)    AST 096 (*) 0 - 37 (U/L)    ALT 108 (*) 0 - 35 (U/L)    Alkaline Phosphatase 73  39 - 117 (U/L)    Total Bilirubin 8.8 (*) 0.3 - 1.2 (mg/dL)    GFR calc non Af Amer 25 (*) >90 (mL/min)    GFR calc Af Amer 29 (*) >90 (mL/min)   LIPASE, BLOOD     Status: Normal   Collection Time   08/17/11  9:40 PM      Component Value Range Comment   Lipase 52  11 - 59 (U/L)   PROTIME-INR     Status: Normal   Collection Time   08/17/11  9:40 PM      Component Value Range Comment   Prothrombin Time 14.2  11.6 - 15.2 (seconds)    INR 1.08  0.00 - 1.49    APTT     Status: Normal   Collection Time   08/17/11  9:40 PM      Component Value Range Comment   aPTT 30  24 - 37 (seconds)   ETHANOL     Status: Normal   Collection Time   08/17/11  9:40 PM      Component Value Range Comment   Alcohol, Ethyl (B) <11  0 - 11 (mg/dL)    No results found.    Assessment Present on Admission:  .Alcoholic hepatitis .Dehydration .ARF (acute renal failure) .Hypokalemia .Hyponatremia .Thrombocytopenia .Polysubstance dependence including opioid type drug, episodic  abuse   PLAN: Will admit her to the step down because of her severe electrolytes abnormalities, potential to undergoes more severe withdrawal from alcohol. She will receive IV fluid, follow her creatinine carefully, replete her potassium aggressively. I suspect her thrombocytopenia is from alcohol as with her mild leukopenia as well. Will get an abdominal ultrasound to evaluate for fluid and to exclude small possibility of hydronephrosis. She will be admitted to [facility] hospitalist service. I urged her very strongly to quit drinking alcohol, and it looks as if she is going to try her best to accomplish this.   Other plans as per orders.   Jillianna Stanek 08/18/2011, 2:40 AM

## 2011-08-18 NOTE — Progress Notes (Signed)
Pt voices questions about test results. Text page sent to Dr Elisabeth Pigeon.

## 2011-08-18 NOTE — Progress Notes (Addendum)
Patient ID: Angela Stark, female   DOB: 1971/02/24, 41 y.o.   MRN: [id number]  Assessment and plan:  Principal Problem:  * Alcohol hepatitis - based on abdominal ultrasound possible hepatic steatosis versus cirrhosis - ativan PRN for agitation or withdrawals - trend LFTs  Active problems:  Thrombocytopenia - secondary to alcohol abuse - no evidence of active bleed - continue to monitor  Neutropenia - this is likely secondary to alcohol abuse - continue to monitor  Hypokalemia - being actively repleted - follow up BMP in am - check magnesium level  Hyponatremia - perhaps secondary to liver disease - sodium stable at 126 - follow up BMP in am  Acute kidney injury - secondary to dehydration - creatinine slowly trending down from 2.36 to 1.86  Left lung base crackles - CXR with incidental nodule in right upper lung lobe - CT chest without contrast for further evaluation  Disposition - anticipate discharge once electrolytes repleted  Subjective: No events overnight. Patient does complain of burning sensation over IV line site after potassium infusion.  Objective:  Vital signs in last 24 hours:  Filed Vitals:   2011/09/06 0500 Sep 06, 2011 0800 06-Sep-2011 0840 06-Sep-2011 1035  BP: 126/78  136/66 118/73  Pulse:    60  Temp:  98.5 F (36.9 C)  98 F (36.7 C)  TempSrc:  Oral  Oral  Resp: 16  18 16   Height:      Weight:      SpO2: 97%  98% 98%    Intake/Output from previous day:   Intake/Output Summary (Last 24 hours) at September 06, 2011 1255 Last data filed at 09/06/11 0800  Gross per 24 hour  Intake   1000 ml  Output    300 ml  Net    700 ml    Physical Exam: General: Alert, awake, oriented x3, in no acute distress. HEENT: No bruits, no goiter. Moist mucous membranes, no scleral icterus, no conjunctival pallor. Heart: Regular rate and rhythm, S1/S2 +, no murmurs, rubs, gallops. Lungs: crackles appreciated over left lung base; otherwise no wheezing and no  rhonchi.  Abdomen: Soft, nontender, nondistended, positive bowel sounds. Extremities: No clubbing or cyanosis, no pitting edema,  positive pedal pulses. Neuro: Grossly nonfocal.  Lab Results:  Lab 09/06/2011 0230 08/17/11 2140  WBC 2.9* 3.9*  HGB 13.1 13.8  HCT 36.9 39.0  PLT 85* 90*  MCV 103.4* 102.1*    Lab 09-06-11 1153 September 06, 2011 0230 08/17/11 2140  NA 126* 126* 123*  K 2.8* 2.5* 2.3*  CL 84* 84* 76*  CO2 28 28 27   GLUCOSE 92 100* 130*  BUN 9 10 11   CREATININE 1.86* 2.04* 2.36*  CALCIUM 7.7* 7.5* 8.5    Lab 08/17/11 2140  INR 1.08  PROTIME --    Recent Results (from the past 240 hour(s))  MRSA PCR SCREENING     Status: Normal   Collection Time   09/06/11  3:00 AM      Component Value Range Status Comment   MRSA by PCR NEGATIVE  NEGATIVE  Final     Studies/Results: Dg Chest 2 View Sep 06, 2011   IMPRESSION:  1.  No evidence for pneumonia. 2.  Indeterminate nodular density in the right upper lobe. Recommend follow-up imaging with non emergent noncontrast CT of the chest.  These results will be called to the ordering clinician or representative by the Radiologist Assistant, and communication documented in the PACS Dashboard.    US Abdomen Complete 2011-09-06    IMPRESSION:  1.  No ascites or hydronephrosis. 2.  Increased liver parenchymal echogenicity.  Differential includes hepatic steatosis versus hepatocellular disease such as cirrhosis.     Medications: Scheduled Meds:   . folic acid  1 mg Oral Daily  . LORazepam  0-4 mg Oral Q6H   Followed by  . LORazepam  0-4 mg Oral Q12H  . mulitivitamin with minerals  1 tablet Oral Daily  . ondansetron  4 mg Intravenous Once  . pantoprazole  40 mg Oral Q0600  . pneumococcal 23 valent vaccine  0.5 mL Intramuscular Tomorrow-1000  . potassium chloride  40 mEq Oral Daily  . thiamine  100 mg Oral Daily   Or  . thiamine  100 mg Intravenous Daily   Continuous Infusions:  PRN Meds:.LORazepam, LORazepam, morphine injection,  ondansetron (ZOFRAN) IV, ondansetron   EDUCATION - test results and diagnostic studies were discussed with patient at the bedside - patient verbalized the understanding - questions were answered at the bedside and contact information was provided for additional questions or concerns   LOS: 1 day   Alton Bouknight 08/18/2011, 12:55 PM  [facility] HOSPITALIST Pager: [phone]

## 2011-08-18 NOTE — Progress Notes (Signed)
CRITICAL VALUE ALERT  Critical value received:  k 2.5  Date of notification:  08/18/11  Time of notification: 0500 Critical value read back:yes  Nurse who received alert:  c Tanicka Bisaillon  MD notified (1st page):  [facility] np  Time of first page:  0545 MD notified (2nd page):  Time of second page:  Responding MD:  [facility] np Time MD responded:0555

## 2011-08-18 NOTE — Progress Notes (Signed)
CARE MANAGEMENT NOTE 08/18/2011  Patient:  Angela Stark, Angela Stark   Account Number:  [id number]  Date Initiated:  08/18/2011  Documentation initiated by:  Deshonda Cryderman  Subjective/Objective Assessment:   pt with K+ of 2.3, with diarrhea, hx of etoh abuse and liver damage, occ pvcs, jaundice,     Action/Plan:   lives alone   Anticipated DC Date:  08/19/2011   Anticipated DC Plan:  HOME/SELF CARE  In-house referral  NA      DC Planning Services  NA      PAC Choice  NA   Choice offered to / List presented to:  NA   DME arranged  NA      DME agency  NA     HH arranged  NA      HH agency  NA  NA   Status of service:  In process, will continue to follow Medicare Important Message given?  NA - LOS <3 / Initial given by admissions (If response is "NO", the following Medicare IM given date fields will be blank) Date Medicare IM given:   Date Additional Medicare IM given:    Discharge Disposition:    Per UR Regulation:  Reviewed for med. necessity/level of care/duration of stay  If discussed at Long Length of Stay Meetings, dates discussed:    Comments:  [id number]/Edlin Ford Earlene Plater, RN, BSN, CCM No discharge needs present at time of this review at the sdu/icu level. Case Management [phone]

## 2011-08-19 DIAGNOSIS — F1010 Alcohol abuse, uncomplicated: Secondary | ICD-10-CM

## 2011-08-19 DIAGNOSIS — D696 Thrombocytopenia, unspecified: Secondary | ICD-10-CM

## 2011-08-19 DIAGNOSIS — K7010 Alcoholic hepatitis without ascites: Secondary | ICD-10-CM

## 2011-08-19 DIAGNOSIS — F10931 Alcohol use, unspecified with withdrawal delirium: Secondary | ICD-10-CM | POA: Diagnosis not present

## 2011-08-19 DIAGNOSIS — N179 Acute kidney failure, unspecified: Secondary | ICD-10-CM

## 2011-08-19 DIAGNOSIS — F10231 Alcohol dependence with withdrawal delirium: Secondary | ICD-10-CM

## 2011-08-19 HISTORY — DX: Alcohol abuse, uncomplicated: F10.10

## 2011-08-19 LAB — COMPREHENSIVE METABOLIC PANEL
ALT: 108 U/L — ABNORMAL HIGH (ref 0–35)
BUN: 9 mg/dL (ref 6–23)
Calcium: 7.7 mg/dL — ABNORMAL LOW (ref 8.4–10.5)
GFR calc Af Amer: 43 mL/min — ABNORMAL LOW (ref >90)
Glucose, Bld: 87 mg/dL (ref 70–99)
Sodium: 126 mEq/L — ABNORMAL LOW (ref 135–145)
Total Protein: 5.7 g/dL — ABNORMAL LOW (ref 6.0–8.3)

## 2011-08-19 LAB — CBC
Hemoglobin: 12.6 g/dL (ref 12.0–15.0)
RBC: 3.5 MIL/uL — ABNORMAL LOW (ref 3.87–5.11)

## 2011-08-19 LAB — MAGNESIUM: Magnesium: 0.6 mg/dL — CL (ref 1.5–2.5)

## 2011-08-19 MED ORDER — LORAZEPAM 2 MG/ML IJ SOLN
1.0000 mg | INTRAMUSCULAR | Status: DC | PRN
  Administered 2011-08-19 – 2011-08-20 (×3): 1 mg via INTRAVENOUS
  Filled 2011-08-19 (×3): qty 1

## 2011-08-19 MED ORDER — LORAZEPAM 2 MG/ML IJ SOLN
2.0000 mg | Freq: Once | INTRAMUSCULAR | Status: AC
  Administered 2011-08-19: 2 mg via INTRAVENOUS

## 2011-08-19 MED ORDER — LORAZEPAM 2 MG/ML IJ SOLN
INTRAMUSCULAR | Status: AC
  Filled 2011-08-19: qty 1

## 2011-08-19 MED ORDER — LORAZEPAM 1 MG PO TABS
1.0000 mg | ORAL_TABLET | ORAL | Status: DC
  Administered 2011-08-19: 1 mg via ORAL

## 2011-08-19 MED ORDER — LORAZEPAM 2 MG/ML IJ SOLN
1.0000 mg | Freq: Once | INTRAMUSCULAR | Status: DC
  Filled 2011-08-19 (×3): qty 1

## 2011-08-19 MED ORDER — LORAZEPAM 2 MG/ML IJ SOLN
0.0000 mg | Freq: Four times a day (QID) | INTRAMUSCULAR | Status: AC
  Administered 2011-08-19: 3 mg via INTRAVENOUS
  Administered 2011-08-19: 2 mg via INTRAVENOUS
  Administered 2011-08-19: 3 mg via INTRAVENOUS
  Administered 2011-08-20: 2 mg via INTRAVENOUS
  Filled 2011-08-19 (×2): qty 1
  Filled 2011-08-19: qty 2
  Filled 2011-08-19: qty 1
  Filled 2011-08-19: qty 2

## 2011-08-19 MED ORDER — NICOTINE 21 MG/24HR TD PT24
21.0000 mg | MEDICATED_PATCH | Freq: Every day | TRANSDERMAL | Status: DC
  Administered 2011-08-19 – 2011-08-21 (×3): 21 mg via TRANSDERMAL
  Filled 2011-08-19 (×3): qty 1

## 2011-08-19 MED ORDER — VITAMIN B-1 100 MG PO TABS: 100.0000 mg | ORAL_TABLET | Freq: Every day | ORAL | Status: DC

## 2011-08-19 MED ORDER — LORAZEPAM 1 MG PO TABS: 1.0000 mg | ORAL_TABLET | Freq: Four times a day (QID) | ORAL | Status: DC | PRN

## 2011-08-19 MED ORDER — LORAZEPAM 2 MG/ML IJ SOLN: 1.0000 mg | Freq: Four times a day (QID) | INTRAMUSCULAR | Status: DC | PRN

## 2011-08-19 MED ORDER — LORAZEPAM 2 MG/ML IJ SOLN
2.0000 mg | Freq: Once | INTRAMUSCULAR | Status: AC
  Administered 2011-08-19: 2 mg via INTRAVENOUS
  Filled 2011-08-19: qty 1

## 2011-08-19 MED ORDER — LORAZEPAM 2 MG/ML IJ SOLN
1.0000 mg | Freq: Once | INTRAMUSCULAR | Status: AC
  Administered 2011-08-19: 1 mg via INTRAVENOUS

## 2011-08-19 MED ORDER — LORAZEPAM 2 MG/ML IJ SOLN
0.0000 mg | Freq: Two times a day (BID) | INTRAMUSCULAR | Status: DC
Start: 2011-08-21 — End: 2011-08-21

## 2011-08-19 MED ORDER — THIAMINE HCL 100 MG/ML IJ SOLN
100.0000 mg | Freq: Every day | INTRAMUSCULAR | Status: DC
  Administered 2011-08-19 – 2011-08-20 (×2): 100 mg via INTRAVENOUS
  Filled 2011-08-19 (×3): qty 1

## 2011-08-19 MED ORDER — LORAZEPAM 2 MG/ML IJ SOLN: 1.0000 mg | INTRAMUSCULAR | Status: DC

## 2011-08-19 MED ORDER — POTASSIUM CHLORIDE 20 MEQ/15ML (10%) PO LIQD: 40.0000 meq | Freq: Every day | ORAL | Status: DC

## 2011-08-19 MED ORDER — FOLIC ACID 1 MG PO TABS
1.0000 mg | ORAL_TABLET | Freq: Every day | ORAL | Status: DC
  Administered 2011-08-19 – 2011-08-21 (×3): 1 mg via ORAL
  Filled 2011-08-19 (×2): qty 1

## 2011-08-19 MED ORDER — LORAZEPAM 1 MG PO TABS
1.0000 mg | ORAL_TABLET | ORAL | Status: DC | PRN
  Administered 2011-08-20 – 2011-08-21 (×2): 2 mg via ORAL
  Filled 2011-08-19 (×2): qty 2

## 2011-08-19 MED ORDER — POTASSIUM CHLORIDE IN NACL 40-0.9 MEQ/L-% IV SOLN
INTRAVENOUS | Status: DC
  Administered 2011-08-19 – 2011-08-20 (×4): via INTRAVENOUS
  Filled 2011-08-19 (×7): qty 1000

## 2011-08-19 MED ORDER — MAGNESIUM SULFATE 40 MG/ML IJ SOLN
4.0000 g | Freq: Once | INTRAMUSCULAR | Status: AC
  Administered 2011-08-19: 4 g via INTRAVENOUS
  Filled 2011-08-19: qty 100

## 2011-08-19 MED ORDER — ADULT MULTIVITAMIN W/MINERALS CH: 1.0000 | ORAL_TABLET | Freq: Every day | ORAL | Status: DC

## 2011-08-19 MED ORDER — THIAMINE HCL 100 MG/ML IJ SOLN: 100.0000 mg | Freq: Every day | INTRAMUSCULAR | Status: DC

## 2011-08-19 NOTE — Progress Notes (Signed)
From shift change at 7am to 11am, pt was extremely agitated, anxious and having auditory, tactile and visual hallucinations.  She also was disoriented, sweaty & c/o headache and nausea.  Called NP & she put in some new orders. Called MD & he came to assess the patient within the hour.

## 2011-08-19 NOTE — Progress Notes (Signed)
Pt progressively getting more agitated, walking in and out of bathroom, over to window, out to doorway. Pt states she is talking to herself but she says she has never done this before. Pt states she sees bunny rabbits running around her room and streaks of light when the door is closed. Pt also states that her bed keeps tipping upside down and the floor is moving. Pt's arms are moderately tremulous. BP stable. Floor coverage notified. Orders received. Will continue to monitor.

## 2011-08-19 NOTE — Progress Notes (Signed)
Spoke with Pt's RN.  Per RN, Pt experienced severe withdrawals from ETOH last night, with AVO hallucinations, extreme agitation, requiring restraints, and being unaffected by medication.  Per RN, Pt is resting and RN is requesting that CSW wait until tomorrow to Ax Pt.  CSW to continue to follow.  Providence Crosby, LCSWA Clinical Social Work [phone]

## 2011-08-19 NOTE — Progress Notes (Signed)
Pt condition have improved & VSs stable.  She is stable and resting comfortably.  Spoke with Dr. Irene Limbo and currently going to hold on transferring patient to stepdown.

## 2011-08-19 NOTE — Progress Notes (Signed)
Pt crawling around on floor, looking under the bed, stating she is looking for her son. When pt was returned to bed, pt disconnected her IV tubing and pulled off telemetry leads. It was explained to the pt that she needed to stay in bed for her safety and not pull at her IV tubing or telemetry. Pt stated she understood and would cooperate. A few minutes later the pt gets out of bed and states she is leaving the hospital to go smoke a cigarette. It was explained to the pt that this is a smoke free facility and that we would have a nicotine patch available soon. The pt was talking to people in her room and stated there was a little girl hiding behind her bed and an old man sitting in the corner. The pt also stated that there were "little animals" running around the floor. Safety sitter is at bedside. Will continue to monitor.

## 2011-08-19 NOTE — Progress Notes (Signed)
Pt complained of feeling restless and "not lucid". Pt stated she has not been able to sleep and thought she was hearing voices in her room. Pt stated she knew she was alone in the room but she was still hearing voices. Upon assessment, pt seem to be moderately agitated, pulling off telemetry wires and fidgeting with her telemetry box. Pt stated that her "mind was foggy" and she hoped to be "lucid" in the morning. Pt stated her last drink was on Saturday night and she only had a few beers but has a previous history of drinking "a lot of rum and coke." Pt stated that she has had "the shakes" before when she has been without alcohol for a few days. VS stable at this time. 1mg  Ativan given via IV per CIWA protocol. Will continue to monitor.

## 2011-08-19 NOTE — Progress Notes (Signed)
[facility] PROGRESS NOTE  Angela Stark JYN:[id number] DOB: 1970-06-21 DOA: 08/17/2011 PCP: No primary provider on file.  Assessment/Plan: 1. Delirium tremens: Last drink approximately 72 hours prior to onset. Poorly controlled with Ativan on the floor. Transfer to step down for closer monitoring. May need Ativan infusion or Precedex infusion. 2. Alcoholic hepatitis/hepatocellular disease: Modest improvement in laboratory studies. Differential includes cirrhosis (favored) or hepatic steatosis. 3. Acute renal failure/dehydration: Improving. Resume IV fluids. 4. Alcohol abuse: Recommend cessation. Social work consultation once improved. 5. Leukopenia/Thrombocytopenia: Stable. Secondary to alcohol abuse. Follow CBC. 6. Hyponatremia: Stable. Secondary to dehydration ("beer drinkers potomania". Continue IV fluids. 7. Hypokalemia: Replete.  Condition guarded.  Code Status: Full code Family Communication: Discussed with boyfriend in person. Disposition Plan: Transfer to step down for closer monitoring. Eventually recommend alcohol/substance abuse rehabilitation.  Brendia Sacks, MD  [facility] Pager [phone]. If 8PM-8AM, please contact night-coverage at [email or web address], password [facility] 08/19/2011, 8:29 AM  LOS: 2 days   Brief narrative: 41 year old woman with hypertension, anxiety and depression with significant alcohol use and history of polysubstance abuse including cocaine presented to the emergency department with abdominal discomfort.  Consultants:    Procedures:    HPI/Subjective: Interval documentation reviewed. Last night she complained of insomnia and auditory hallucinations. She became progressively more agitated, complained of visual hallucinations and was noted to be tremulous. Afebrile, vital signs stable.  Objective: Filed Vitals:   08/19/11 0233 08/19/11 0347 08/19/11 0415 08/19/11 0547  BP: 133/94 158/82 122/85   Pulse: 74 65 75   Temp:    98.6 F (37 C)   TempSrc:   Oral   Resp:   22   Height:      Weight:    95.8 kg (211 lb 3.2 oz)  SpO2:   96%     Intake/Output Summary (Last 24 hours) at 08/19/11 0829 Last data filed at 08/18/11 1700  Gross per 24 hour  Intake    240 ml  Output      0 ml  Net    240 ml    Exam:   General:  Mildly agitated. Pulling it wrist restraints. Disoriented but orients on examiner and follows commands. Nontoxic.  Eyes: Pupils equal, round, reactive to light. Normal lids, irises, conjunctiva.  ENT: Grossly normal hearing. Lips and tongue appear unremarkable. Dry mucous membranes.  Neck: Supple, no lymphadenopathy. No thyromegaly.  Cardiovascular: Tachycardic, regular rhythm. No murmur, rub, gallop. No lower extremity edema.  Respiratory: Clear to auscultation bilaterally. No wheezes, rales, rhonchi. Normal respiratory effort.  Abdomen: Soft, nontender, nondistended.  Skin: Grossly unremarkable.  Musculoskeletal: Moves arms and legs to command. Grossly normal tone and strength in the upper and lower, this.  Psychiatric: Oriented to name and year. Not oriented to place, month.   Data Reviewed: Basic Metabolic Panel:  Lab 08/19/11 9629 08/18/11 1153 08/18/11 0230 08/17/11 2140  NA 126* 126* 126* 123*  K 3.0* 2.8* -- --  CL 87* 84* 84* 76*  CO2 25 28 28 27   GLUCOSE 87 92 100* 130*  BUN 9 9 10 11   CREATININE 1.67* 1.86* 2.04* 2.36*  CALCIUM 7.7* 7.7* 7.5* 8.5  MG 0.6* -- -- --  PHOS -- -- -- --   Liver Function Tests:  Lab 08/19/11 0445 08/17/11 2140  AST 205* 205*  ALT 108* 108*  ALKPHOS 66 73  BILITOT 7.0* 8.8*  PROT 5.7* 6.4  ALBUMIN 3.3* 3.6    Lab 08/17/11 2140  LIPASE 52  AMYLASE --   CBC:  Lab 08/19/11 0445 08/18/11 0230 08/17/11 2140  WBC 3.8* 2.9* 3.9*  NEUTROABS -- -- 2.5  HGB 12.6 13.1 13.8  HCT 36.8 36.9 39.0  MCV 105.1* 103.4* 102.1*  PLT 106* 85* 90*   Recent Results (from the past 240 hour(s))  MRSA PCR SCREENING     Status: Normal    Collection Time   08/18/11  3:00 AM      Component Value Range Status Comment   MRSA by PCR NEGATIVE  NEGATIVE  Final     Studies: Ct Chest Wo Contrast  08/18/2011  *RADIOLOGY REPORT*  Clinical Data: Possible right upper lobe lung nodule on prior plain film.  CT CHEST WITHOUT CONTRAST IMPRESSION:  1.  No pulmonary nodule to correspond to the plain film abnormality.  This was either artifactual or secondary to minimal right-sided pleural thickening. 2. No acute process in the chest. 3.  Marked hepatic steatosis and hepatomegaly.  Areas of relative hyperattenuation are incompletely characterized most likely areas of sparing.  Original Report Authenticated By: Consuello Bossier, M.D.   US Abdomen Complete  08/18/2011  *RADIOLOGY REPORT*  Clinical Data:  Concern for ascites and hydronephrosis. Elevated LFTs.  COMPLETE ABDOMINAL ULTRASOUND IMPRESSION:  1.  No ascites or hydronephrosis. 2.  Increased liver parenchymal echogenicity.  Differential includes hepatic steatosis versus hepatocellular disease such as cirrhosis.  Original Report Authenticated By: Genevive Bi, M.D.   Scheduled Meds:   . folic acid  1 mg Oral Daily  . folic acid  1 mg Oral Daily  . LORazepam  0-4 mg Intravenous Q6H   Followed by  . LORazepam  0-4 mg Intravenous Q12H  . LORazepam  1 mg Intravenous Once  . LORazepam  1 mg Intravenous Once  . LORazepam  1 mg Intravenous Q2H   Or  . LORazepam  1 mg Oral Q2H  . LORazepam  2 mg Intravenous Once  . LORazepam  2 mg Intravenous Once  . LORazepam  2 mg Intravenous Once  . magnesium sulfate 1 - 4 g bolus IVPB  4 g Intravenous Once  . mulitivitamin with minerals  1 tablet Oral Daily  . nicotine  21 mg Transdermal Daily  . pantoprazole  40 mg Oral Q0600  . pneumococcal 23 valent vaccine  0.5 mL Intramuscular Tomorrow-1000  . potassium chloride  40 mEq Oral Daily  . potassium chloride  20 mEq Oral Once  . potassium chloride  40 mEq Oral Daily  . sodium chloride  3 mL Intravenous  Q12H  . thiamine  100 mg Oral Daily   Or  . thiamine  100 mg Intravenous Daily  . DISCONTD: folic acid  1 mg Oral Daily  . DISCONTD: LORazepam  0-4 mg Oral Q6H  . DISCONTD: LORazepam  0-4 mg Oral Q12H  . DISCONTD: mulitivitamin with minerals  1 tablet Oral Daily  . DISCONTD: thiamine  100 mg Intravenous Daily  . DISCONTD: thiamine  100 mg Intravenous Daily  . DISCONTD: thiamine  100 mg Oral Daily  . DISCONTD: thiamine  100 mg Oral Daily   Continuous Infusions:   Principal Problem:  *Delirium tremens Active Problems:  Alcoholic hepatitis  ARF (acute renal failure)  Dehydration  Hypokalemia  Hyponatremia  Thrombocytopenia  Polysubstance dependence including opioid type drug, episodic abuse  Alcohol abuse

## 2011-08-20 LAB — COMPREHENSIVE METABOLIC PANEL
ALT: 99 U/L — ABNORMAL HIGH (ref 0–35)
AST: 173 U/L — ABNORMAL HIGH (ref 0–37)
Albumin: 2.9 g/dL — ABNORMAL LOW (ref 3.5–5.2)
Calcium: 7.7 mg/dL — ABNORMAL LOW (ref 8.4–10.5)
GFR calc Af Amer: 73 mL/min — ABNORMAL LOW (ref >90)
Sodium: 136 mEq/L (ref 135–145)
Total Protein: 5.4 g/dL — ABNORMAL LOW (ref 6.0–8.3)

## 2011-08-20 LAB — CBC
HCT: 36 % (ref 36.0–46.0)
Hemoglobin: 12 g/dL (ref 12.0–15.0)
MCH: 35.6 pg — ABNORMAL HIGH (ref 26.0–34.0)
MCHC: 33.3 g/dL (ref 30.0–36.0)

## 2011-08-20 MED ORDER — LORAZEPAM 2 MG/ML IJ SOLN
2.0000 mg | Freq: Once | INTRAMUSCULAR | Status: AC
  Administered 2011-08-20: 2 mg via INTRAVENOUS
  Filled 2011-08-20: qty 1

## 2011-08-20 NOTE — Progress Notes (Signed)
Attempted to meet with Pt.  Pt currently in the shower.  CSW to continue to follow.  Providence Crosby, LCSWA Clinical Social Work [phone]

## 2011-08-20 NOTE — Progress Notes (Signed)
[facility] PROGRESS NOTE  Angela Stark ONG:[id number] DOB: May 25, 1970 DOA: 08/17/2011 PCP: No primary provider on file. none currently.  Assessment/Plan: 1. Delirium tremens: Appears resolved.  2. Alcoholic hepatitis/hepatocellular disease: Continue to improvement in laboratory studies. Differential includes cirrhosis (favored) or hepatic steatosis. Followup with gastroenterology as an outpatient. 3. Acute renal failure/dehydration: Resolved with IV fluids. Secondary to poor oral intake, alcohol abuse. 4. Alcohol abuse: Recommend cessation. Social work Administrator, sports. 5. Leukopenia/Thrombocytopenia: Stable. Secondary to alcohol abuse.  6. Hyponatremia: Resolved. Secondary to dehydration ("beer drinkers potomania".  7. Hypokalemia: Repleted.  Code Status: Full code Family Communication:  Disposition Plan: Anticipate home with outpatient rehabilitation.  Brendia Sacks, MD  [facility] Pager [phone]. If 8PM-8AM, please contact night-coverage at [email or web address], password [facility] 08/20/2011, 12:32 PM  LOS: 3 days   Brief narrative: 41 year old woman with hypertension, anxiety and depression with significant alcohol use and history of polysubstance abuse including cocaine presented to the emergency department with abdominal discomfort.  HPI/Subjective: Improved rapidly yesterday afternoon. Afebrile, vital signs stable. Laboratory studies markedly improved. No complaints.  Objective: Filed Vitals:   08/19/11 1617 08/19/11 1733 08/19/11 2116 08/20/11 0537  BP: 126/82 114/74 121/86 113/78  Pulse: 78 82 84 69  Temp: 98.3 F (36.8 C) 98.5 F (36.9 C) 98.5 F (36.9 C) 97 F (36.1 C)  TempSrc: Oral Axillary Oral Axillary  Resp: 18 18 20 18   Height:      Weight:    95.9 kg (211 lb 6.7 oz)  SpO2: 92% 95% 95% 95%    Intake/Output Summary (Last 24 hours) at 08/20/11 1232 Last data filed at 08/20/11 0930  Gross per 24 hour  Intake   2950 ml  Output   4101 ml  Net   -1151 ml    Exam:   General:  Calm and comfortable.  Cardiovascular: Tachycardic, regular rhythm. No murmur, rub, gallop.   Respiratory: Clear to auscultation bilaterally. No wheezes, rales, rhonchi. Normal respiratory effort.  Psychiatric: Grossly normal mood and affect. Speech fluent and appropriate.  Data Reviewed: Basic Metabolic Panel:  Lab 08/20/11 2536 08/19/11 0445 08/18/11 1153 08/18/11 0230 08/17/11 2140  NA 136 126* 126* 126* 123*  K 4.0 3.0* -- -- --  CL 99 87* 84* 84* 76*  CO2 26 25 28 28 27   GLUCOSE 83 87 92 100* 130*  BUN 6 9 9 10 11   CREATININE 1.08 1.67* 1.86* 2.04* 2.36*  CALCIUM 7.7* 7.7* 7.7* 7.5* 8.5  MG -- 0.6* -- -- --  PHOS -- -- -- -- --   Liver Function Tests:  Lab 08/20/11 0445 08/19/11 0445 08/17/11 2140  AST 173* 205* 205*  ALT 99* 108* 108*  ALKPHOS 61 66 73  BILITOT 5.1* 7.0* 8.8*  PROT 5.4* 5.7* 6.4  ALBUMIN 2.9* 3.3* 3.6    Lab 08/17/11 2140  LIPASE 52  AMYLASE --   CBC:  Lab 08/20/11 0445 08/19/11 0445 08/18/11 0230 08/17/11 2140  WBC 3.0* 3.8* 2.9* 3.9*  NEUTROABS -- -- -- 2.5  HGB 12.0 12.6 13.1 13.8  HCT 36.0 36.8 36.9 39.0  MCV 106.8* 105.1* 103.4* 102.1*  PLT 138* 106* 85* 90*   Recent Results (from the past 240 hour(s))  MRSA PCR SCREENING     Status: Normal   Collection Time   08/18/11  3:00 AM      Component Value Range Status Comment   MRSA by PCR NEGATIVE  NEGATIVE  Final     Studies: Ct Chest Wo Contrast  08/18/2011  *RADIOLOGY  REPORT*  Clinical Data: Possible right upper lobe lung nodule on prior plain film.  CT CHEST WITHOUT CONTRAST IMPRESSION:  1.  No pulmonary nodule to correspond to the plain film abnormality.  This was either artifactual or secondary to minimal right-sided pleural thickening. 2. No acute process in the chest. 3.  Marked hepatic steatosis and hepatomegaly.  Areas of relative hyperattenuation are incompletely characterized most likely areas of sparing.  Original Report Authenticated By:  Consuello Bossier, M.D.   US Abdomen Complete  08/18/2011  *RADIOLOGY REPORT*  Clinical Data:  Concern for ascites and hydronephrosis. Elevated LFTs.  COMPLETE ABDOMINAL ULTRASOUND IMPRESSION:  1.  No ascites or hydronephrosis. 2.  Increased liver parenchymal echogenicity.  Differential includes hepatic steatosis versus hepatocellular disease such as cirrhosis.  Original Report Authenticated By: Genevive Bi, M.D.   Scheduled Meds:    . folic acid  1 mg Oral Daily  . LORazepam  0-4 mg Intravenous Q6H   Followed by  . LORazepam  0-4 mg Intravenous Q12H  . LORazepam  2 mg Intravenous Once  . mulitivitamin with minerals  1 tablet Oral Daily  . nicotine  21 mg Transdermal Daily  . pantoprazole  40 mg Oral Q0600  . sodium chloride  3 mL Intravenous Q12H  . thiamine  100 mg Intravenous Daily   Continuous Infusions:    . 0.9 % NaCl with KCl 40 mEq / L 125 mL/hr at 08/20/11 4098   Principal Problem:  *Delirium tremens Active Problems:  Alcoholic hepatitis  ARF (acute renal failure)  Dehydration  Hypokalemia  Hyponatremia  Thrombocytopenia  Polysubstance dependence including opioid type drug, episodic abuse  Alcohol abuse

## 2011-08-21 ENCOUNTER — Encounter: Payer: Self-pay | Admitting: Gastroenterology

## 2011-08-21 DIAGNOSIS — F10231 Alcohol dependence with withdrawal delirium: Secondary | ICD-10-CM

## 2011-08-21 DIAGNOSIS — K7010 Alcoholic hepatitis without ascites: Secondary | ICD-10-CM

## 2011-08-21 DIAGNOSIS — N179 Acute kidney failure, unspecified: Secondary | ICD-10-CM

## 2011-08-21 LAB — COMPREHENSIVE METABOLIC PANEL
Alkaline Phosphatase: 61 U/L (ref 39–117)
BUN: 7 mg/dL (ref 6–23)
GFR calc Af Amer: 89 mL/min — ABNORMAL LOW (ref >90)
Glucose, Bld: 95 mg/dL (ref 70–99)
Potassium: 4 mEq/L (ref 3.5–5.1)
Total Protein: 5 g/dL — ABNORMAL LOW (ref 6.0–8.3)

## 2011-08-21 MED ORDER — ADULT MULTIVITAMIN W/MINERALS CH: 1.0000 | ORAL_TABLET | Freq: Every day | ORAL | Status: DC

## 2011-08-21 MED ORDER — NICOTINE 21 MG/24HR TD PT24: 1.0000 | MEDICATED_PATCH | Freq: Every day | TRANSDERMAL | Status: AC

## 2011-08-21 MED ORDER — VITAMIN B-1 100 MG PO TABS
100.0000 mg | ORAL_TABLET | Freq: Every day | ORAL | Status: DC
  Administered 2011-08-21: 100 mg via ORAL
  Filled 2011-08-21: qty 1

## 2011-08-21 NOTE — Progress Notes (Signed)
The patient is receiving Thiamine by the intravenous route. Based on criteria approved by the Pharmacy and Therapeutics Committee and the Medical Executive Committee, the medication is being converted to the equivalent oral dose form.   These criteria include:  -No Active GI bleeding  -Able to tolerate diet of full liquids (or better) or tube feeding  -Able to tolerate other medications by the oral or enteral route   If you have any questions about this conversion, please contact the Pharmacy Department (ext [phone]). Thank you.  Loralee Pacas, PharmD, BCPS 08/21/2011 8:29 AM

## 2011-08-21 NOTE — Progress Notes (Signed)
Clinical Social Work Department BRIEF PSYCHOSOCIAL ASSESSMENT 08/21/2011  Patient:  Angela Stark, Angela Stark     Account Number:  [id number]     Admit date:  08/17/2011  Clinical Social Worker:  Doroteo Glassman  Date/Time:  08/21/2011 11:31 AM  Referred by:  Physician  Date Referred:  08/21/2011 Referred for  Other - See comment   Other Referral:   Financial   Interview type:  Patient Other interview type:    PSYCHOSOCIAL DATA Living Status:  OTHER Admitted from facility:   Level of care:   Primary support name:  Maeby Vankleeck Primary support relationship to patient:  FAMILY Degree of support available:   Pt lives with boyfriend.    CURRENT CONCERNS Current Concerns  Substance Abuse   Other Concerns:   Pt has financial constraints.  Has been seen by financial counselor, Melissa Montane.    SOCIAL WORK ASSESSMENT / PLAN Pt reports that she has been drinking daily for over 9 years.  She states that she drinks at least 1-2 rum-and-cokes per day.  Pt reports significant withdrawal sxs while in [facility] and reports that she's ready for tx.    Pt believes that she will be d/c'd today.   Assessment/plan status:  No Further Intervention Required Other assessment/ plan:   Information/referral to community resources:   Provided Pt with AA information, as well as inforamtion on [facility], [facility], [facility] and [facility]    PATIENT'S/FAMILY'S RESPONSE TO PLAN OF CARE: Pt thanked CSW for time and assistance.  No further needs identified.  CSW signing off.  Providence Crosby, [facility] Clinical Social Worker [phone]

## 2011-08-21 NOTE — Discharge Summary (Addendum)
Physician Discharge Summary  Angela Stark ZOX:[id number] DOB: 11-10-1970 DOA: 08/17/2011  PCP: No primary provider on file.  Admit date: 08/17/2011 Discharge date: 08/21/2011  Recommendations for Outpatient Follow-up:  1. I recommend the patient establish with a primary care physician of her choice and also follow up with [facility] gastroenterology with Dr. Christella Hartigan 09/16/11 at 9 AM.  2. Continue to encourage abstinence from alcohol.  3. Consider repeating liver function tests and following up on possible cirrhosis in approximately one month.   Discharge Diagnoses:  1. Delirium tremens 2. Alcoholic hepatitis 3. Suspected cirrhosis secondary to above 4. Acute renal failure and dehydration, resolved 5. Alcohol abuse 6. Stable leukopenia and thrombocytopenia 7. Hyponatremia, resolved 8. Hypokalemia, repleted  Discharge Condition: Improved Disposition: Home  Diet recommendation: Regular  History of present illness:  41 year old woman with hypertension, anxiety and depression with significant alcohol use and history of polysubstance abuse including cocaine presented to the emergency department with abdominal discomfort.  Hospital Course:  Ms. Wilson Singer was admitted to the medical floor and given IV fluids for her acute renal failure. She developed delirium tremens while hospitalized which quickly resolved with treatment with Ativan. Her acute renal failure has resolved as has her electrolyte abnormalities with IV fluids and replacement therapy. She has been counseled on complete cessation from alcohol and on suspected cirrhosis. She has been given resources by social work. Her alcoholic hepatitis continues to improve and she is stable for discharge. 1. Delirium tremens: Resolved.  2. Alcoholic hepatitis/hepatocellular disease: Continues to improve. Differential includes cirrhosis (favored) or hepatic steatosis. Followup with gastroenterology as an outpatient.  3. Acute renal failure/dehydration:  Resolved with IV fluids. Secondary to poor oral intake, alcohol abuse.  4. Alcohol abuse: Recommended cessation. Social work consultation appreciated.  5. Leukopenia/Thrombocytopenia: Stable. Secondary to alcohol abuse.   6. Hyponatremia: Resolved. Secondary to dehydration ("beer drinkers potomania").    7. Hypokalemia: Repleted.  Consultations: None Procedures: None  Discharge Instructions  Discharge Orders    Future Appointments: Provider: Department: Dept Phone: Center:   09/16/2011 9:00 AM Rachael Fee, MD [facility] Office [phone] [facility]     Future Orders Please Complete By Expires   Diet general      Discharge instructions      Comments:   Absolutely no alcohol. Recommend establishing a primary care physician of your choice.    Activity as tolerated - No restrictions        Medication List  As of 08/21/2011 12:39 PM   TAKE these medications         mulitivitamin with minerals Tabs   Take 1 tablet by mouth daily.      nicotine 21 mg/24hr patch   Commonly known as: NICODERM CQ - dosed in mg/24 hours   Place 1 patch onto the skin daily.            The results of significant diagnostics from this hospitalization (including imaging, microbiology, ancillary and laboratory) are listed below for reference.    Significant Diagnostic Studies: Ct Chest Wo Contrast  08/18/2011  *RADIOLOGY REPORT*  Clinical Data: Possible right upper lobe lung nodule on prior plain film.  CT CHEST WITHOUT CONTRAST  Technique:  Multidetector CT imaging of the chest was performed following the standard protocol without IV contrast.  Comparison: Plain film of earlier in the day.  05/21/2008.  No prior CT of  Findings: Lung windows demonstrate nonspecific mosaic attenuation at the medial right apex on image 10.  Mild.  No correlate pulmonary nodule  for the questioned plain film abnormality.  This may have been artifactual or secondary to minimal posterior right pleural thickening (image 15).   Clear left lung.  Soft tissue windows demonstrate normal heart size without pericardial or pleural effusion.  No mediastinal or definite hilar adenopathy, given limitations of unenhanced CT.  Limited abdominal imaging demonstrates severe hepatic steatosis. Probable hepatomegaly, incompletely imaged.  Areas of relative hyperechogenicity in the subcapsular right lobe (image 52) and left lobe (image 61).  A splenule.  No acute osseous abnormality.  IMPRESSION:  1.  No pulmonary nodule to correspond to the plain film abnormality.  This was either artifactual or secondary to minimal right-sided pleural thickening. 2. No acute process in the chest. 3.  Marked hepatic steatosis and hepatomegaly.  Areas of relative hyperattenuation are incompletely characterized most likely areas of sparing.  Original Report Authenticated By: Consuello Bossier, M.D.   US Abdomen Complete  08/18/2011  *RADIOLOGY REPORT*  Clinical Data:  Concern for ascites and hydronephrosis. Elevated LFTs.  COMPLETE ABDOMINAL ULTRASOUND  Comparison:  None.  Findings:  Gallbladder:  No gallbladder wall thickening or pericholecystic fluid.  No gallstones.  Negative sonographic Murphy's sign.  Common bile duct:  Normal diameter 3 mm.  Upper limits of normal 6 mm.  Liver:  Uniformly increased in echogenicity.  No ductal dilatation. No evidence of ascites.  IVC:  Appears normal.  Pancreas:  No focal abnormality seen.  Spleen:  Normal size and echogenicity.  Right Kidney:  12.0cm in length.  No evidence of hydronephrosis or stones.  Left Kidney:  12.0cm in length.  No evidence of hydronephrosis or stones.  Abdominal aorta:  No aneurysm identified.  IMPRESSION:  1.  No ascites or hydronephrosis. 2.  Increased liver parenchymal echogenicity.  Differential includes hepatic steatosis versus hepatocellular disease such as cirrhosis.  Original Report Authenticated By: Genevive Bi, M.D.   Microbiology: Recent Results (from the past 240 hour(s))  MRSA PCR  SCREENING     Status: Normal   Collection Time   08/18/11  3:00 AM      Component Value Range Status Comment   MRSA by PCR NEGATIVE  NEGATIVE  Final     Labs: Basic Metabolic Panel:  Lab 08/21/11 4098 08/20/11 0445 08/19/11 0445 08/18/11 1153 08/18/11 0230  NA 138 136 126* 126* 126*  K 4.0 4.0 -- -- --  CL 104 99 87* 84* 84*  CO2 24 26 25 28 28   GLUCOSE 95 83 87 92 100*  BUN 7 6 9 9 10   CREATININE 0.92 1.08 1.67* 1.86* 2.04*  CALCIUM 7.8* 7.7* 7.7* 7.7* 7.5*  MG -- -- 0.6* -- --  PHOS -- -- -- -- --   Liver Function Tests:  Lab 08/21/11 0433 08/20/11 0445 08/19/11 0445 08/17/11 2140  AST 132* 173* 205* 205*  ALT 86* 99* 108* 108*  ALKPHOS 61 61 66 73  BILITOT 3.5* 5.1* 7.0* 8.8*  PROT 5.0* 5.4* 5.7* 6.4  ALBUMIN 2.6* 2.9* 3.3* 3.6    Lab 08/17/11 2140  LIPASE 52  AMYLASE --   CBC:  Lab 08/20/11 0445 08/19/11 0445 08/18/11 0230 08/17/11 2140  WBC 3.0* 3.8* 2.9* 3.9*  NEUTROABS -- -- -- 2.5  HGB 12.0 12.6 13.1 13.8  HCT 36.0 36.8 36.9 39.0  MCV 106.8* 105.1* 103.4* 102.1*  PLT 138* 106* 85* 90*   Principal Problem:  *Delirium tremens Active Problems:  Alcoholic hepatitis  ARF (acute renal failure)  Dehydration  Hypokalemia  Hyponatremia  Thrombocytopenia  Polysubstance  dependence including opioid type drug, episodic abuse  Alcohol abuse   Time coordinating discharge: 25 minutes.  Signed:  Brendia Sacks, MD [facility] 08/21/2011, 12:39 PM

## 2011-08-21 NOTE — Progress Notes (Signed)
IV occluded, Patient reluctant in regards to new stick..On call aware

## 2011-08-21 NOTE — Progress Notes (Signed)
[facility] PROGRESS NOTE  Janeshia Ciliberto ZOX:[id number] DOB: Nov 18, 1970 DOA: 08/17/2011 PCP: No primary provider on file. none currently.  Assessment/Plan: 1. Delirium tremens: Resolved. 2. Alcoholic hepatitis/hepatocellular disease: Continues to improve. Differential includes cirrhosis (favored) or hepatic steatosis. Followup with gastroenterology as an outpatient. 3. Acute renal failure/dehydration: Resolved with IV fluids. Secondary to poor oral intake, alcohol abuse. 4. Alcohol abuse: Recommended cessation. Social work consultation appreciated. 5. Leukopenia/Thrombocytopenia: Stable. Secondary to alcohol abuse.  6. Hyponatremia: Resolved. Secondary to dehydration ("beer drinkers potomania").  7. Hypokalemia: Repleted.  Code Status: Full code Family Communication:  Disposition Plan: Anticipate home with outpatient rehabilitation.  Brendia Sacks, MD  [facility] Pager [phone]. If 8PM-8AM, please contact night-coverage at [email or web address], password [facility] 08/21/2011, 12:23 PM  LOS: 4 days   Brief narrative: 41 year old woman with hypertension, anxiety and depression with significant alcohol use and history of polysubstance abuse including cocaine presented to the emergency department with abdominal discomfort.  HPI/Subjective: Feels fine. Ready to go home.  Objective: Filed Vitals:   08/20/11 0537 08/20/11 1400 08/20/11 2107 08/21/11 0528  BP: 113/78 126/86 133/99 144/81  Pulse: 69 80 75 78  Temp: 97 F (36.1 C) 98 F (36.7 C) 97.9 F (36.6 C) 97.9 F (36.6 C)  TempSrc: Axillary Oral Oral Oral  Resp: 18 18 20 17   Height:      Weight: 95.9 kg (211 lb 6.7 oz)   100.3 kg (221 lb 1.9 oz)  SpO2: 95% 99% 99% 95%    Intake/Output Summary (Last 24 hours) at 08/21/11 1223 Last data filed at 08/20/11 2100  Gross per 24 hour  Intake   1000 ml  Output      1 ml  Net    999 ml    Exam:   General:  Calm and comfortable.  Psychiatric: Grossly normal  mood and affect. Speech fluent and appropriate.  Data Reviewed: Basic Metabolic Panel:  Lab 08/21/11 8295 08/20/11 0445 08/19/11 0445 08/18/11 1153 08/18/11 0230  NA 138 136 126* 126* 126*  K 4.0 4.0 -- -- --  CL 104 99 87* 84* 84*  CO2 24 26 25 28 28   GLUCOSE 95 83 87 92 100*  BUN 7 6 9 9 10   CREATININE 0.92 1.08 1.67* 1.86* 2.04*  CALCIUM 7.8* 7.7* 7.7* 7.7* 7.5*  MG -- -- 0.6* -- --  PHOS -- -- -- -- --   Liver Function Tests:  Lab 08/21/11 0433 08/20/11 0445 08/19/11 0445 08/17/11 2140  AST 132* 173* 205* 205*  ALT 86* 99* 108* 108*  ALKPHOS 61 61 66 73  BILITOT 3.5* 5.1* 7.0* 8.8*  PROT 5.0* 5.4* 5.7* 6.4  ALBUMIN 2.6* 2.9* 3.3* 3.6    Lab 08/17/11 2140  LIPASE 52  AMYLASE --   CBC:  Lab 08/20/11 0445 08/19/11 0445 08/18/11 0230 08/17/11 2140  WBC 3.0* 3.8* 2.9* 3.9*  NEUTROABS -- -- -- 2.5  HGB 12.0 12.6 13.1 13.8  HCT 36.0 36.8 36.9 39.0  MCV 106.8* 105.1* 103.4* 102.1*  PLT 138* 106* 85* 90*   Recent Results (from the past 240 hour(s))  MRSA PCR SCREENING     Status: Normal   Collection Time   08/18/11  3:00 AM      Component Value Range Status Comment   MRSA by PCR NEGATIVE  NEGATIVE  Final     Studies: Ct Chest Wo Contrast  08/18/2011  *RADIOLOGY REPORT*  Clinical Data: Possible right upper lobe lung nodule on prior plain film.  CT CHEST  WITHOUT CONTRAST IMPRESSION:  1.  No pulmonary nodule to correspond to the plain film abnormality.  This was either artifactual or secondary to minimal right-sided pleural thickening. 2. No acute process in the chest. 3.  Marked hepatic steatosis and hepatomegaly.  Areas of relative hyperattenuation are incompletely characterized most likely areas of sparing.  Original Report Authenticated By: Consuello Bossier, M.D.   US Abdomen Complete  08/18/2011  *RADIOLOGY REPORT*  Clinical Data:  Concern for ascites and hydronephrosis. Elevated LFTs.  COMPLETE ABDOMINAL ULTRASOUND IMPRESSION:  1.  No ascites or hydronephrosis. 2.   Increased liver parenchymal echogenicity.  Differential includes hepatic steatosis versus hepatocellular disease such as cirrhosis.  Original Report Authenticated By: Genevive Bi, M.D.   Scheduled Meds:    . folic acid  1 mg Oral Daily  . LORazepam  0-4 mg Intravenous Q6H   Followed by  . LORazepam  0-4 mg Intravenous Q12H  . mulitivitamin with minerals  1 tablet Oral Daily  . nicotine  21 mg Transdermal Daily  . pantoprazole  40 mg Oral Q0600  . sodium chloride  3 mL Intravenous Q12H  . thiamine  100 mg Oral Daily  . DISCONTD: thiamine  100 mg Intravenous Daily   Continuous Infusions:    . DISCONTD: 0.9 % NaCl with KCl 40 mEq / L 125 mL/hr at 08/20/11 8295   Principal Problem:  *Delirium tremens Active Problems:  Alcoholic hepatitis  ARF (acute renal failure)  Dehydration  Hypokalemia  Hyponatremia  Thrombocytopenia  Polysubstance dependence including opioid type drug, episodic abuse  Alcohol abuse

## 2011-09-16 ENCOUNTER — Ambulatory Visit: Payer: BC Managed Care – PPO | Admitting: Gastroenterology

## 2011-10-08 ENCOUNTER — Telehealth: Payer: Self-pay | Admitting: Gastroenterology

## 2011-10-08 NOTE — Telephone Encounter (Signed)
Message copied by Arna Snipe on Thu Oct 08, 2011 10:55 PM ------      Message from: Donata Duff      Created: Wed Sep 16, 2011  9:17 AM       Do not bill

## 2012-07-12 ENCOUNTER — Other Ambulatory Visit: Payer: Self-pay | Admitting: Nurse Practitioner

## 2012-07-12 DIAGNOSIS — N6452 Nipple discharge: Secondary | ICD-10-CM

## 2014-07-13 ENCOUNTER — Other Ambulatory Visit: Payer: Self-pay

## 2014-08-06 ENCOUNTER — Telehealth: Payer: Self-pay | Admitting: Family

## 2014-08-06 ENCOUNTER — Encounter: Payer: Self-pay | Admitting: Family

## 2014-08-06 ENCOUNTER — Ambulatory Visit (INDEPENDENT_AMBULATORY_CARE_PROVIDER_SITE_OTHER): Payer: BLUE CROSS/BLUE SHIELD | Admitting: Family

## 2014-08-06 ENCOUNTER — Other Ambulatory Visit (INDEPENDENT_AMBULATORY_CARE_PROVIDER_SITE_OTHER): Payer: BLUE CROSS/BLUE SHIELD

## 2014-08-06 VITALS — BP 138/96 | HR 64 | Temp 98.3°F | Resp 18 | Ht 66.75 in | Wt 238.0 lb

## 2014-08-06 DIAGNOSIS — Z720 Tobacco use: Secondary | ICD-10-CM

## 2014-08-06 DIAGNOSIS — Z716 Tobacco abuse counseling: Secondary | ICD-10-CM | POA: Diagnosis not present

## 2014-08-06 DIAGNOSIS — F329 Major depressive disorder, single episode, unspecified: Secondary | ICD-10-CM

## 2014-08-06 DIAGNOSIS — Z1231 Encounter for screening mammogram for malignant neoplasm of breast: Secondary | ICD-10-CM

## 2014-08-06 DIAGNOSIS — E039 Hypothyroidism, unspecified: Secondary | ICD-10-CM

## 2014-08-06 DIAGNOSIS — F418 Other specified anxiety disorders: Secondary | ICD-10-CM | POA: Diagnosis not present

## 2014-08-06 DIAGNOSIS — F32A Depression, unspecified: Secondary | ICD-10-CM

## 2014-08-06 DIAGNOSIS — F419 Anxiety disorder, unspecified: Secondary | ICD-10-CM

## 2014-08-06 LAB — BASIC METABOLIC PANEL
BUN: 12 mg/dL (ref 6–23)
CHLORIDE: 99 meq/L (ref 96–112)
CO2: 30 mEq/L (ref 19–32)
Calcium: 10 mg/dL (ref 8.4–10.5)
Creatinine, Ser: 0.68 mg/dL (ref 0.40–1.20)
GFR: 99.97 mL/min (ref >60.00)
Glucose, Bld: 90 mg/dL (ref 70–99)
Potassium: 4.3 mEq/L (ref 3.5–5.1)
SODIUM: 136 meq/L (ref 135–145)

## 2014-08-06 LAB — TSH: TSH: 2.18 u[IU]/mL (ref 0.35–4.50)

## 2014-08-06 MED ORDER — AMLODIPINE BESYLATE 10 MG PO TABS: 10.0000 mg | ORAL_TABLET | Freq: Every day | ORAL | Status: DC

## 2014-08-06 MED ORDER — BUPROPION HCL ER (XL) 150 MG PO TB24: 150.0000 mg | ORAL_TABLET | Freq: Every day | ORAL | Status: DC

## 2014-08-06 MED ORDER — LEVOTHYROXINE SODIUM 25 MCG PO TABS: 25.0000 ug | ORAL_TABLET | Freq: Every day | ORAL | Status: DC

## 2014-08-06 NOTE — Assessment & Plan Note (Signed)
Previously abnormal mammogram. Due for follow-up mammogram. Referral placed to obtain mammogram.

## 2014-08-06 NOTE — Assessment & Plan Note (Signed)
Discussed mechanisms of smoking cessation including gums, patches, and medication. Patient is currently ready to quit smoking and questioning potential mechanisms which may work for her. Given previous Wellbutrin experience, discussed continuing Wellbutrin and increase in the 300 mg daily. Discontinue smoking in 1 week. Follow-up in 30 days.

## 2014-08-06 NOTE — Assessment & Plan Note (Signed)
Previously diagnosed anxiety and depression stable with current regimen. Denies suicidal ideation. Denies panic attacks. Increase Wellbutrin to 300 mg twice daily.

## 2014-08-06 NOTE — Assessment & Plan Note (Signed)
Hypothyroidism stable with current regimen of levothyroxine 25 g. Obtain TSH to determine current status. Continue current dosage of levothyroxine pending lab results.

## 2014-08-06 NOTE — Patient Instructions (Signed)
Thank you for choosing [facility].  Summary/Instructions:  Your prescription(s) have been submitted to your pharmacy or been printed and provided for you. Please take as directed and contact our office if you believe you are having problem(s) with the medication(s) or have any questions.  Please stop by the lab on the basement level of the building for your blood work. Your results will be released to MyChart (or called to you) after review, usually within 72 hours after test completion. If any changes need to be made, you will be notified at that same time.  Smoking Cessation Quitting smoking is important to your health and has many advantages. However, it is not always easy to quit since nicotine is a very addictive drug. Oftentimes, people try 3 times or more before being able to quit. This document explains the best ways for you to prepare to quit smoking. Quitting takes hard work and a lot of effort, but you can do it. ADVANTAGES OF QUITTING SMOKING  You will live longer, feel better, and live better.  Your body will feel the impact of quitting smoking almost immediately.  Within 20 minutes, blood pressure decreases. Your pulse returns to its normal level.  After 8 hours, carbon monoxide levels in the blood return to normal. Your oxygen level increases.  After 24 hours, the chance of having a heart attack starts to decrease. Your breath, hair, and body stop smelling like smoke.  After 48 hours, damaged nerve endings begin to recover. Your sense of taste and smell improve.  After 72 hours, the body is virtually free of nicotine. Your bronchial tubes relax and breathing becomes easier.  After 2 to 12 weeks, lungs can hold more air. Exercise becomes easier and circulation improves.  The risk of having a heart attack, stroke, cancer, or lung disease is greatly reduced.  After 1 year, the risk of coronary heart disease is cut in half.  After 5 years, the risk of stroke falls  to the same as a nonsmoker.  After 10 years, the risk of lung cancer is cut in half and the risk of other cancers decreases significantly.  After 15 years, the risk of coronary heart disease drops, usually to the level of a nonsmoker.  If you are pregnant, quitting smoking will improve your chances of having a healthy baby.  The people you live with, especially any children, will be healthier.  You will have extra money to spend on things other than cigarettes. QUESTIONS TO THINK ABOUT BEFORE ATTEMPTING TO QUIT You may want to talk about your answers with your health care provider.  Why do you want to quit?  If you tried to quit in the past, what helped and what did not?  What will be the most difficult situations for you after you quit? How will you plan to handle them?  Who can help you through the tough times? Your family? Friends? A health care provider?  What pleasures do you get from smoking? What ways can you still get pleasure if you quit? Here are some questions to ask your health care provider:  How can you help me to be successful at quitting?  What medicine do you think would be best for me and how should I take it?  What should I do if I need more help?  What is smoking withdrawal like? How can I get information on withdrawal? GET READY  Set a quit date.  Change your environment by getting rid of all cigarettes,  ashtrays, matches, and lighters in your home, car, or work. Do not let people smoke in your home.  Review your past attempts to quit. Think about what worked and what did not. GET SUPPORT AND ENCOURAGEMENT You have a better chance of being successful if you have help. You can get support in many ways.  Tell your family, friends, and coworkers that you are going to quit and need their support. Ask them not to smoke around you.  Get individual, group, or telephone counseling and support. Programs are available at [facility]. Call  your local health department for information about programs in your area.  Spiritual beliefs and practices may help some smokers quit.  Download a "quit meter" on your computer to keep track of quit statistics, such as how long you have gone without smoking, cigarettes not smoked, and money saved.  Get a self-help book about quitting smoking and staying off tobacco. LEARN NEW SKILLS AND BEHAVIORS  Distract yourself from urges to smoke. Talk to someone, go for a walk, or occupy your time with a task.  Change your normal routine. Take a different route to work. Drink tea instead of coffee. Eat breakfast in a different place.  Reduce your stress. Take a hot bath, exercise, or read a book.  Plan something enjoyable to do every day. Reward yourself for not smoking.  Explore interactive web-based programs that specialize in helping you quit. GET MEDICINE AND USE IT CORRECTLY Medicines can help you stop smoking and decrease the urge to smoke. Combining medicine with the above behavioral methods and support can greatly increase your chances of successfully quitting smoking.  Nicotine replacement therapy helps deliver nicotine to your body without the negative effects and risks of smoking. Nicotine replacement therapy includes nicotine gum, lozenges, inhalers, nasal sprays, and skin patches. Some may be available over-the-counter and others require a prescription.  Antidepressant medicine helps people abstain from smoking, but how this works is unknown. This medicine is available by prescription.  Nicotinic receptor partial agonist medicine simulates the effect of nicotine in your brain. This medicine is available by prescription. Ask your health care provider for advice about which medicines to use and how to use them based on your health history. Your health care provider will tell you what side effects to look out for if you choose to be on a medicine or therapy. Carefully read the information on  the package. Do not use any other product containing nicotine while using a nicotine replacement product.  RELAPSE OR DIFFICULT SITUATIONS Most relapses occur within the first 3 months after quitting. Do not be discouraged if you start smoking again. Remember, most people try several times before finally quitting. You may have symptoms of withdrawal because your body is used to nicotine. You may crave cigarettes, be irritable, feel very hungry, cough often, get headaches, or have difficulty concentrating. The withdrawal symptoms are only temporary. They are strongest when you first quit, but they will go away within 10-14 days. To reduce the chances of relapse, try to:  Avoid drinking alcohol. Drinking lowers your chances of successfully quitting.  Reduce the amount of caffeine you consume. Once you quit smoking, the amount of caffeine in your body increases and can give you symptoms, such as a rapid heartbeat, sweating, and anxiety.  Avoid smokers because they can make you want to smoke.  Do not let weight gain distract you. Many smokers will gain weight when they quit, usually less than 10 pounds.  Eat a healthy diet and stay active. You can always lose the weight gained after you quit.  Find ways to improve your mood other than smoking. FOR MORE INFORMATION  www.smokefree.gov  Document Released: 03/03/2001 Document Revised: 07/24/2013 Document Reviewed: 06/18/2011 [facility] Patient Information 2015 [place]. This information is not intended to replace advice given to you by your health care provider. Make sure you discuss any questions you have with your health care provider.

## 2014-08-06 NOTE — Progress Notes (Signed)
Subjective:    Patient ID: Angela Stark, female    DOB: Jul 25, 1970, 44 y.o.   MRN: [id number]  Chief Complaint  Patient presents with  . Establish Care    has been having weight gain which is why she was given levothyroxine and needs thyroid checked, was also told that she has "cellulosis and abscess of the trunk" was never told what it meant and wanted to get some clarification, and referral to get mammo done    HPI:  Angela Stark is a 44 y.o. female PMH of alcoholic hepatitis, hypothyroidism, anxiety and depression, and alcohol abuse who presents today for an office visit to establish care.    1) Hypothyroidism - Previously noted to have increased TSH and was prescribed levothyroxine for weight gain as well. Takes her medication as prescribed. Denies any adverse side effects.   Lab Results  Component Value Date   TSH 2.36 07/12/2006    2) Smoking Cessation - Interested in quitting smoking. Currently smokes 1 pack per day. She has quit in the past when she was pregnant, but has not been able to do so recently. Has also tried the vap stick. Denies any other methods of reduction of smoking.  3) Mammogram - indicates that she previously noted to have a spot on her left breast during her last mammogram about 2 years ago. Was never followed up on secondary to loss of insurance.   4) Anxiety and depression -  Previously noted to have increased anxiety and depressive symptoms which have been well controlled with the Wellbutrin. Denies any suicidal ideations.    No Known Allergies   Current Outpatient Prescriptions on File Prior to Visit  Medication Sig Dispense Refill  . Multiple Vitamin (MULITIVITAMIN WITH MINERALS) TABS Take 1 tablet by mouth daily.     No current facility-administered medications on file prior to visit.    Past Medical History  Diagnosis Date  . Hypertension   . GERD (gastroesophageal reflux disease)   . Hyperlipidemia   . Sleep apnea   . Anxiety     . Depression   . Heart murmur     When pregnant  . Phlebitis     Past Surgical History  Procedure Laterality Date  . Ovary surgery      Family History  Problem Relation Age of Onset  . Hypertension Mother   . Hyperlipidemia Mother   . Diabetes Mother   . Emphysema Mother   . Heart disease Father   . Hypertension Father   . Stroke Brother   . Lung cancer Maternal Grandmother     History   Social History  . Marital Status: Single    Spouse Name: N/A  . Number of Children: 3  . Years of Education: 14   Occupational History  . Sales    Social History Main Topics  . Smoking status: Current Every Day Smoker -- 1.00 packs/day for 0 years    Types: Cigarettes  . Smokeless tobacco: Not on file  . Alcohol Use: Yes     Comment: occasional - previously recovering alcoholic   . Drug Use: No  . Sexual Activity: Yes    Birth Control/ Protection: None   Other Topics Concern  . Not on file   Social History Narrative   Fun: Fishing, camping   Denies religious beliefs effecting health care.     Review of Systems  Respiratory: Negative for chest tightness.   Cardiovascular: Negative for chest pain, palpitations and leg swelling.  Endocrine: Negative for cold intolerance and heat intolerance.  Psychiatric/Behavioral: Negative for dysphoric mood. The patient is not nervous/anxious.       Objective:    BP 138/96 mmHg  Pulse 64  Temp(Src) 98.3 F (36.8 C) (Oral)  Resp 18  Ht 5' 6.75" (1.695 m)  Wt 238 lb (107.956 kg)  BMI 37.58 kg/m2  SpO2 97% Nursing note and vital signs reviewed.  Physical Exam  Constitutional: She is oriented to person, place, and time. She appears well-developed and well-nourished. No distress.  Cardiovascular: Normal rate, regular rhythm, normal heart sounds and intact distal pulses.   Pulmonary/Chest: Effort normal and breath sounds normal.  Neurological: She is alert and oriented to person, place, and time.  Skin: Skin is warm and dry.   Psychiatric: She has a normal mood and affect. Her behavior is normal. Judgment and thought content normal.       Assessment & Plan:

## 2014-08-06 NOTE — Telephone Encounter (Signed)
Please inform patient that her thyroid function is normal, so please continue to take the levothyroxine 25 g daily. Also her kidney function and electrolytes are both within normal ranges. I have sent in a prescription for a refill of her levothyroxine.

## 2014-08-06 NOTE — Progress Notes (Signed)
Pre visit review using our clinic review tool, if applicable. No additional management support is needed unless otherwise documented below in the visit note. 

## 2014-08-07 NOTE — Telephone Encounter (Signed)
Left detailed message with message below.

## 2014-08-13 ENCOUNTER — Other Ambulatory Visit: Payer: Self-pay | Admitting: Family

## 2014-08-13 DIAGNOSIS — N6452 Nipple discharge: Secondary | ICD-10-CM

## 2014-08-13 DIAGNOSIS — N6002 Solitary cyst of left breast: Secondary | ICD-10-CM

## 2014-08-14 ENCOUNTER — Encounter: Payer: Self-pay | Admitting: Cardiovascular Disease

## 2014-08-27 ENCOUNTER — Ambulatory Visit
Admission: RE | Admit: 2014-08-27 | Discharge: 2014-08-27 | Disposition: A | Discharge status: Home / Self Care | Payer: BLUE CROSS/BLUE SHIELD | Source: Ambulatory Visit | Attending: Family | Admitting: Family

## 2014-08-27 ENCOUNTER — Other Ambulatory Visit: Payer: Self-pay | Admitting: Obstetrics and Gynecology

## 2014-08-27 DIAGNOSIS — N6452 Nipple discharge: Secondary | ICD-10-CM

## 2014-08-27 DIAGNOSIS — N6002 Solitary cyst of left breast: Secondary | ICD-10-CM

## 2014-08-28 LAB — CYTOLOGY - PAP

## 2014-09-03 ENCOUNTER — Encounter: Payer: BLUE CROSS/BLUE SHIELD | Admitting: Family

## 2014-09-03 DIAGNOSIS — Z0289 Encounter for other administrative examinations: Secondary | ICD-10-CM

## 2014-09-10 ENCOUNTER — Encounter: Payer: BLUE CROSS/BLUE SHIELD | Admitting: Family

## 2014-11-27 ENCOUNTER — Encounter: Payer: BLUE CROSS/BLUE SHIELD | Admitting: Family

## 2014-11-30 ENCOUNTER — Ambulatory Visit (INDEPENDENT_AMBULATORY_CARE_PROVIDER_SITE_OTHER): Payer: BLUE CROSS/BLUE SHIELD | Admitting: Family

## 2014-11-30 ENCOUNTER — Encounter: Payer: Self-pay | Admitting: Family

## 2014-11-30 VITALS — BP 134/82 | HR 72 | Temp 98.4°F | Resp 18 | Ht 66.75 in | Wt 242.4 lb

## 2014-11-30 DIAGNOSIS — Z23 Encounter for immunization: Secondary | ICD-10-CM | POA: Diagnosis not present

## 2014-11-30 DIAGNOSIS — Z0001 Encounter for general adult medical examination with abnormal findings: Secondary | ICD-10-CM | POA: Insufficient documentation

## 2014-11-30 DIAGNOSIS — Z131 Encounter for screening for diabetes mellitus: Secondary | ICD-10-CM | POA: Insufficient documentation

## 2014-11-30 DIAGNOSIS — Z0000 Encounter for general adult medical examination without abnormal findings: Secondary | ICD-10-CM

## 2014-11-30 HISTORY — DX: Encounter for screening for diabetes mellitus: Z13.1

## 2014-11-30 MED ORDER — HYDROCHLOROTHIAZIDE 12.5 MG PO CAPS: 12.5000 mg | ORAL_CAPSULE | Freq: Every day | ORAL | Status: DC | PRN

## 2014-11-30 MED ORDER — CIPROFLOXACIN HCL 500 MG PO TABS
500.0000 mg | ORAL_TABLET | Freq: Two times a day (BID) | ORAL | Status: DC
Start: 2014-11-30 — End: 2015-04-16

## 2014-11-30 NOTE — Patient Instructions (Addendum)
Thank you for choosing [facility].  Summary/Instructions:  Your prescription(s) have been submitted to your pharmacy or been printed and provided for you. Please take as directed and contact our office if you believe you are having problem(s) with the medication(s) or have any questions.  Please stop by the lab on the basement level of the building for your blood work. Your results will be released to [facility] (or called to you) after review, usually within 72 hours after test completion. If any changes need to be made, you will be notified at that same time.   CLINIC DAYS AND TIMES:   For your convenience, we offer services in both our [place] and [facility] locations. In [place] we are located at [facility]  Our [place] clinic is located at [address]  Call [phone], Monday-Friday for individual appointments.  Health Maintenance Adopting a healthy lifestyle and getting preventive care can go a long way to promote health and wellness. Talk with your health care provider about what schedule of regular examinations is right for you. This is a good chance for you to check in with your provider about disease prevention and staying healthy. In between checkups, there are plenty of things you can do on your own. Experts have done a lot of research about which lifestyle changes and preventive measures are most likely to keep you healthy. Ask your health care provider for more information. WEIGHT AND DIET  Eat a healthy diet  Be sure to include plenty of vegetables, fruits, low-fat dairy products, and lean protein.  Do not eat a lot of foods high in solid fats, added sugars, or salt.  Get regular exercise. This is one of the most important things you can do for your health.  Most adults should exercise for at least 150 minutes each week. The exercise should increase your heart rate and make you sweat (moderate-intensity exercise).  Most adults should also  do strengthening exercises at least twice a week. This is in addition to the moderate-intensity exercise.  Maintain a healthy weight  Body mass index (BMI) is a measurement that can be used to identify possible weight problems. It estimates body fat based on height and weight. Your health care provider can help determine your BMI and help you achieve or maintain a healthy weight.  For females 54 years of age and older:   A BMI below 18.5 is considered underweight.  A BMI of 18.5 to 24.9 is normal.  A BMI of 25 to 29.9 is considered overweight.  A BMI of 30 and above is considered obese.  Watch levels of cholesterol and blood lipids  You should start having your blood tested for lipids and cholesterol at 44 years of age, then have this test every 5 years.  You may need to have your cholesterol levels checked more often if:  Your lipid or cholesterol levels are high.  You are older than 44 years of age.  You are at high risk for heart disease.  CANCER SCREENING   Lung Cancer  Lung cancer screening is recommended for adults 59-6 years old who are at high risk for lung cancer because of a history of smoking.  A yearly low-dose CT scan of the lungs is recommended for people who:  Currently smoke.  Have quit within the past 15 years.  Have at least a 30-pack-year history of smoking. A pack year is smoking an average of one pack of cigarettes a day for  1 year.  Yearly screening should continue until it has been 15 years since you quit.  Yearly screening should stop if you develop a health problem that would prevent you from having lung cancer treatment.  Breast Cancer  Practice breast self-awareness. This means understanding how your breasts normally appear and feel.  It also means doing regular breast self-exams. Let your health care provider know about any changes, no matter how small.  If you are in your 20s or 30s, you should have a clinical breast exam (CBE) by a  health care provider every 1-3 years as part of a regular health exam.  If you are 32 or older, have a CBE every year. Also consider having a breast X-ray (mammogram) every year.  If you have a family history of breast cancer, talk to your health care provider about genetic screening.  If you are at high risk for breast cancer, talk to your health care provider about having an MRI and a mammogram every year.  Breast cancer gene (BRCA) assessment is recommended for women who have family members with BRCA-related cancers. BRCA-related cancers include:  Breast.  Ovarian.  Tubal.  Peritoneal cancers.  Results of the assessment will determine the need for genetic counseling and BRCA1 and BRCA2 testing. Cervical Cancer Routine pelvic examinations to screen for cervical cancer are no longer recommended for nonpregnant women who are considered low risk for cancer of the pelvic organs (ovaries, uterus, and vagina) and who do not have symptoms. A pelvic examination may be necessary if you have symptoms including those associated with pelvic infections. Ask your health care provider if a screening pelvic exam is right for you.   The Pap test is the screening test for cervical cancer for women who are considered at risk.  If you had a hysterectomy for a problem that was not cancer or a condition that could lead to cancer, then you no longer need Pap tests.  If you are older than 65 years, and you have had normal Pap tests for the past 10 years, you no longer need to have Pap tests.  If you have had past treatment for cervical cancer or a condition that could lead to cancer, you need Pap tests and screening for cancer for at least 20 years after your treatment.  If you no longer get a Pap test, assess your risk factors if they change (such as having a new sexual partner). This can affect whether you should start being screened again.  Some women have medical problems that increase their chance of  getting cervical cancer. If this is the case for you, your health care provider may recommend more frequent screening and Pap tests.  The human papillomavirus (HPV) test is another test that may be used for cervical cancer screening. The HPV test looks for the virus that can cause cell changes in the cervix. The cells collected during the Pap test can be tested for HPV.  The HPV test can be used to screen women 38 years of age and older. Getting tested for HPV can extend the interval between normal Pap tests from three to five years.  An HPV test also should be used to screen women of any age who have unclear Pap test results.  After 44 years of age, women should have HPV testing as often as Pap tests.  Colorectal Cancer  This type of cancer can be detected and often prevented.  Routine colorectal cancer screening usually begins at 44 years of  age and continues through 44 years of age.  Your health care provider may recommend screening at an earlier age if you have risk factors for colon cancer.  Your health care provider may also recommend using home test kits to check for hidden blood in the stool.  A small camera at the end of a tube can be used to examine your colon directly (sigmoidoscopy or colonoscopy). This is done to check for the earliest forms of colorectal cancer.  Routine screening usually begins at age 20.  Direct examination of the colon should be repeated every 5-10 years through 44 years of age. However, you may need to be screened more often if early forms of precancerous polyps or small growths are found. Skin Cancer  Check your skin from head to toe regularly.  Tell your health care provider about any new moles or changes in moles, especially if there is a change in a mole's shape or color.  Also tell your health care provider if you have a mole that is larger than the size of a pencil eraser.  Always use sunscreen. Apply sunscreen liberally and repeatedly  throughout the day.  Protect yourself by wearing long sleeves, pants, a wide-brimmed hat, and sunglasses whenever you are outside. HEART DISEASE, DIABETES, AND HIGH BLOOD PRESSURE   Have your blood pressure checked at least every 1-2 years. High blood pressure causes heart disease and increases the risk of stroke.  If you are between 16 years and 21 years old, ask your health care provider if you should take aspirin to prevent strokes.  Have regular diabetes screenings. This involves taking a blood sample to check your fasting blood sugar level.  If you are at a normal weight and have a low risk for diabetes, have this test once every three years after 44 years of age.  If you are overweight and have a high risk for diabetes, consider being tested at a younger age or more often. PREVENTING INFECTION  Hepatitis B  If you have a higher risk for hepatitis B, you should be screened for this virus. You are considered at high risk for hepatitis B if:  You were born in a country where hepatitis B is common. Ask your health care provider which countries are considered high risk.  Your parents were born in a high-risk country, and you have not been immunized against hepatitis B (hepatitis B vaccine).  You have HIV or AIDS.  You use needles to inject street drugs.  You live with someone who has hepatitis B.  You have had sex with someone who has hepatitis B.  You get hemodialysis treatment.  You take certain medicines for conditions, including cancer, organ transplantation, and autoimmune conditions. Hepatitis C  Blood testing is recommended for:  Everyone born from [birth year] through 1965.  Anyone with known risk factors for hepatitis C. Sexually transmitted infections (STIs)  You should be screened for sexually transmitted infections (STIs) including gonorrhea and chlamydia if:  You are sexually active and are younger than 44 years of age.  You are older than 44 years of age and your  health care provider tells you that you are at risk for this type of infection.  Your sexual activity has changed since you were last screened and you are at an increased risk for chlamydia or gonorrhea. Ask your health care provider if you are at risk.  If you do not have HIV, but are at risk, it may be recommended that you take a  prescription medicine daily to prevent HIV infection. This is called pre-exposure prophylaxis (PrEP). You are considered at risk if:  You are sexually active and do not regularly use condoms or know the HIV status of your partner(s).  You take drugs by injection.  You are sexually active with a partner who has HIV. Talk with your health care provider about whether you are at high risk of being infected with HIV. If you choose to begin PrEP, you should first be tested for HIV. You should then be tested every 3 months for as long as you are taking PrEP.  PREGNANCY   If you are premenopausal and you may become pregnant, ask your health care provider about preconception counseling.  If you may become pregnant, take 400 to 800 micrograms (mcg) of folic acid every day.  If you want to prevent pregnancy, talk to your health care provider about birth control (contraception). OSTEOPOROSIS AND MENOPAUSE   Osteoporosis is a disease in which the bones lose minerals and strength with aging. This can result in serious bone fractures. Your risk for osteoporosis can be identified using a bone density scan.  If you are 23 years of age or older, or if you are at risk for osteoporosis and fractures, ask your health care provider if you should be screened.  Ask your health care provider whether you should take a calcium or vitamin D supplement to lower your risk for osteoporosis.  Menopause may have certain physical symptoms and risks.  Hormone replacement therapy may reduce some of these symptoms and risks. Talk to your health care provider about whether hormone replacement  therapy is right for you.  HOME CARE INSTRUCTIONS   Schedule regular health, dental, and eye exams.  Stay current with your immunizations.   Do not use any tobacco products including cigarettes, chewing tobacco, or electronic cigarettes.  If you are pregnant, do not drink alcohol.  If you are breastfeeding, limit how much and how often you drink alcohol.  Limit alcohol intake to no more than 1 drink per day for nonpregnant women. One drink equals 12 ounces of beer, 5 ounces of wine, or 1 ounces of hard liquor.  Do not use street drugs.  Do not share needles.  Ask your health care provider for help if you need support or information about quitting drugs.  Tell your health care provider if you often feel depressed.  Tell your health care provider if you have ever been abused or do not feel safe at home. Document Released: 09/22/2010 Document Revised: 07/24/2013 Document Reviewed: 02/08/2013 [facility] Patient Information 2015 [place], [place]. This information is not intended to replace advice given to you by your health care provider. Make sure you discuss any questions you have with your health care provider.  Smoking Cessation Quitting smoking is important to your health and has many advantages. However, it is not always easy to quit since nicotine is a very addictive drug. Oftentimes, people try 3 times or more before being able to quit. This document explains the best ways for you to prepare to quit smoking. Quitting takes hard work and a lot of effort, but you can do it. ADVANTAGES OF QUITTING SMOKING  You will live longer, feel better, and live better.  Your body will feel the impact of quitting smoking almost immediately.  Within 20 minutes, blood pressure decreases. Your pulse returns to its normal level.  After 8 hours, carbon monoxide levels in the blood return to normal. Your oxygen level increases.  After 24 hours, the chance of having a heart attack starts to  decrease. Your breath, hair, and body stop smelling like smoke.  After 48 hours, damaged nerve endings begin to recover. Your sense of taste and smell improve.  After 72 hours, the body is virtually free of nicotine. Your bronchial tubes relax and breathing becomes easier.  After 2 to 12 weeks, lungs can hold more air. Exercise becomes easier and circulation improves.  The risk of having a heart attack, stroke, cancer, or lung disease is greatly reduced.  After 1 year, the risk of coronary heart disease is cut in half.  After 5 years, the risk of stroke falls to the same as a nonsmoker.  After 10 years, the risk of lung cancer is cut in half and the risk of other cancers decreases significantly.  After 15 years, the risk of coronary heart disease drops, usually to the level of a nonsmoker.  If you are pregnant, quitting smoking will improve your chances of having a healthy baby.  The people you live with, especially any children, will be healthier.  You will have extra money to spend on things other than cigarettes. QUESTIONS TO THINK ABOUT BEFORE ATTEMPTING TO QUIT You may want to talk about your answers with your health care provider.  Why do you want to quit?  If you tried to quit in the past, what helped and what did not?  What will be the most difficult situations for you after you quit? How will you plan to handle them?  Who can help you through the tough times? Your family? Friends? A health care provider?  What pleasures do you get from smoking? What ways can you still get pleasure if you quit? Here are some questions to ask your health care provider:  How can you help me to be successful at quitting?  What medicine do you think would be best for me and how should I take it?  What should I do if I need more help?  What is smoking withdrawal like? How can I get information on withdrawal? GET READY  Set a quit date.  Change your environment by getting rid of all  cigarettes, ashtrays, matches, and lighters in your home, car, or work. Do not let people smoke in your home.  Review your past attempts to quit. Think about what worked and what did not. GET SUPPORT AND ENCOURAGEMENT You have a better chance of being successful if you have help. You can get support in many ways.  Tell your family, friends, and coworkers that you are going to quit and need their support. Ask them not to smoke around you.  Get individual, group, or telephone counseling and support. Programs are available at [employer] and health centers. Call your local health department for information about programs in your area.  Spiritual beliefs and practices may help some smokers quit.  Download a "quit meter" on your computer to keep track of quit statistics, such as how long you have gone without smoking, cigarettes not smoked, and money saved.  Get a self-help book about quitting smoking and staying off tobacco. Temple yourself from urges to smoke. Talk to someone, go for a walk, or occupy your time with a task.  Change your normal routine. Take a different route to work. Drink tea instead of coffee. Eat breakfast in a different place.  Reduce your stress. Take a hot bath, exercise, or read a book.  Plan something enjoyable to do every day. Reward yourself for not smoking.  Explore interactive web-based programs that specialize in helping you quit. GET MEDICINE AND USE IT CORRECTLY Medicines can help you stop smoking and decrease the urge to smoke. Combining medicine with the above behavioral methods and support can greatly increase your chances of successfully quitting smoking.  Nicotine replacement therapy helps deliver nicotine to your body without the negative effects and risks of smoking. Nicotine replacement therapy includes nicotine gum, lozenges, inhalers, nasal sprays, and skin patches. Some may be available over-the-counter and  others require a prescription.  Antidepressant medicine helps people abstain from smoking, but how this works is unknown. This medicine is available by prescription.  Nicotinic receptor partial agonist medicine simulates the effect of nicotine in your brain. This medicine is available by prescription. Ask your health care provider for advice about which medicines to use and how to use them based on your health history. Your health care provider will tell you what side effects to look out for if you choose to be on a medicine or therapy. Carefully read the information on the package. Do not use any other product containing nicotine while using a nicotine replacement product.  RELAPSE OR DIFFICULT SITUATIONS Most relapses occur within the first 3 months after quitting. Do not be discouraged if you start smoking again. Remember, most people try several times before finally quitting. You may have symptoms of withdrawal because your body is used to nicotine. You may crave cigarettes, be irritable, feel very hungry, cough often, get headaches, or have difficulty concentrating. The withdrawal symptoms are only temporary. They are strongest when you first quit, but they will go away within 10-14 days. To reduce the chances of relapse, try to:  Avoid drinking alcohol. Drinking lowers your chances of successfully quitting.  Reduce the amount of caffeine you consume. Once you quit smoking, the amount of caffeine in your body increases and can give you symptoms, such as a rapid heartbeat, sweating, and anxiety.  Avoid smokers because they can make you want to smoke.  Do not let weight gain distract you. Many smokers will gain weight when they quit, usually less than 10 pounds. Eat a healthy diet and stay active. You can always lose the weight gained after you quit.  Find ways to improve your mood other than smoking. FOR MORE INFORMATION  www.smokefree.gov  Document Released: 03/03/2001 Document Revised:  07/24/2013 Document Reviewed: 06/18/2011 [facility] Patient Information 2015 [place], [place]. This information is not intended to replace advice given to you by your health care provider. Make sure you discuss any questions you have with your health care provider.  Health Maintenance Adopting a healthy lifestyle and getting preventive care can go a long way to promote health and wellness. Talk with your health care provider about what schedule of regular examinations is right for you. This is a good chance for you to check in with your provider about disease prevention and staying healthy. In between checkups, there are plenty of things you can do on your own. Experts have done a lot of research about which lifestyle changes and preventive measures are most likely to keep you healthy. Ask your health care provider for more information. WEIGHT AND DIET  Eat a healthy diet  Be sure to include plenty of vegetables, fruits, low-fat dairy products, and lean protein.  Do not eat a lot of foods high in solid fats, added sugars, or salt.  Get regular exercise. This is one of  the most important things you can do for your health.  Most adults should exercise for at least 150 minutes each week. The exercise should increase your heart rate and make you sweat (moderate-intensity exercise).  Most adults should also do strengthening exercises at least twice a week. This is in addition to the moderate-intensity exercise.  Maintain a healthy weight  Body mass index (BMI) is a measurement that can be used to identify possible weight problems. It estimates body fat based on height and weight. Your health care provider can help determine your BMI and help you achieve or maintain a healthy weight.  For females 2 years of age and older:   A BMI below 18.5 is considered underweight.  A BMI of 18.5 to 24.9 is normal.  A BMI of 25 to 29.9 is considered overweight.  A BMI of 30 and above is considered obese.   Watch levels of cholesterol and blood lipids  You should start having your blood tested for lipids and cholesterol at 44 years of age, then have this test every 5 years.  You may need to have your cholesterol levels checked more often if:  Your lipid or cholesterol levels are high.  You are older than 44 years of age.  You are at high risk for heart disease.  CANCER SCREENING   Lung Cancer  Lung cancer screening is recommended for adults 37-37 years old who are at high risk for lung cancer because of a history of smoking.  A yearly low-dose CT scan of the lungs is recommended for people who:  Currently smoke.  Have quit within the past 15 years.  Have at least a 30-pack-year history of smoking. A pack year is smoking an average of one pack of cigarettes a day for 1 year.  Yearly screening should continue until it has been 15 years since you quit.  Yearly screening should stop if you develop a health problem that would prevent you from having lung cancer treatment.  Breast Cancer  Practice breast self-awareness. This means understanding how your breasts normally appear and feel.  It also means doing regular breast self-exams. Let your health care provider know about any changes, no matter how small.  If you are in your 20s or 30s, you should have a clinical breast exam (CBE) by a health care provider every 1-3 years as part of a regular health exam.  If you are 70 or older, have a CBE every year. Also consider having a breast X-ray (mammogram) every year.  If you have a family history of breast cancer, talk to your health care provider about genetic screening.  If you are at high risk for breast cancer, talk to your health care provider about having an MRI and a mammogram every year.  Breast cancer gene (BRCA) assessment is recommended for women who have family members with BRCA-related cancers. BRCA-related cancers  include:  Breast.  Ovarian.  Tubal.  Peritoneal cancers.  Results of the assessment will determine the need for genetic counseling and BRCA1 and BRCA2 testing. Cervical Cancer Routine pelvic examinations to screen for cervical cancer are no longer recommended for nonpregnant women who are considered low risk for cancer of the pelvic organs (ovaries, uterus, and vagina) and who do not have symptoms. A pelvic examination may be necessary if you have symptoms including those associated with pelvic infections. Ask your health care provider if a screening pelvic exam is right for you.   The Pap test is the screening test  for cervical cancer for women who are considered at risk.  If you had a hysterectomy for a problem that was not cancer or a condition that could lead to cancer, then you no longer need Pap tests.  If you are older than 65 years, and you have had normal Pap tests for the past 10 years, you no longer need to have Pap tests.  If you have had past treatment for cervical cancer or a condition that could lead to cancer, you need Pap tests and screening for cancer for at least 20 years after your treatment.  If you no longer get a Pap test, assess your risk factors if they change (such as having a new sexual partner). This can affect whether you should start being screened again.  Some women have medical problems that increase their chance of getting cervical cancer. If this is the case for you, your health care provider may recommend more frequent screening and Pap tests.  The human papillomavirus (HPV) test is another test that may be used for cervical cancer screening. The HPV test looks for the virus that can cause cell changes in the cervix. The cells collected during the Pap test can be tested for HPV.  The HPV test can be used to screen women 1 years of age and older. Getting tested for HPV can extend the interval between normal Pap tests from three to five years.  An HPV  test also should be used to screen women of any age who have unclear Pap test results.  After 44 years of age, women should have HPV testing as often as Pap tests.  Colorectal Cancer  This type of cancer can be detected and often prevented.  Routine colorectal cancer screening usually begins at 44 years of age and continues through 44 years of age.  Your health care provider may recommend screening at an earlier age if you have risk factors for colon cancer.  Your health care provider may also recommend using home test kits to check for hidden blood in the stool.  A small camera at the end of a tube can be used to examine your colon directly (sigmoidoscopy or colonoscopy). This is done to check for the earliest forms of colorectal cancer.  Routine screening usually begins at age 50.  Direct examination of the colon should be repeated every 5-10 years through 44 years of age. However, you may need to be screened more often if early forms of precancerous polyps or small growths are found. Skin Cancer  Check your skin from head to toe regularly.  Tell your health care provider about any new moles or changes in moles, especially if there is a change in a mole's shape or color.  Also tell your health care provider if you have a mole that is larger than the size of a pencil eraser.  Always use sunscreen. Apply sunscreen liberally and repeatedly throughout the day.  Protect yourself by wearing long sleeves, pants, a wide-brimmed hat, and sunglasses whenever you are outside. HEART DISEASE, DIABETES, AND HIGH BLOOD PRESSURE   Have your blood pressure checked at least every 1-2 years. High blood pressure causes heart disease and increases the risk of stroke.  If you are between 28 years and 3 years old, ask your health care provider if you should take aspirin to prevent strokes.  Have regular diabetes screenings. This involves taking a blood sample to check your fasting blood sugar  level.  If you are at a normal  weight and have a low risk for diabetes, have this test once every three years after 44 years of age.  If you are overweight and have a high risk for diabetes, consider being tested at a younger age or more often. PREVENTING INFECTION  Hepatitis B  If you have a higher risk for hepatitis B, you should be screened for this virus. You are considered at high risk for hepatitis B if:  You were born in a country where hepatitis B is common. Ask your health care provider which countries are considered high risk.  Your parents were born in a high-risk country, and you have not been immunized against hepatitis B (hepatitis B vaccine).  You have HIV or AIDS.  You use needles to inject street drugs.  You live with someone who has hepatitis B.  You have had sex with someone who has hepatitis B.  You get hemodialysis treatment.  You take certain medicines for conditions, including cancer, organ transplantation, and autoimmune conditions. Hepatitis C  Blood testing is recommended for:  Everyone born from [birth year] through 1965.  Anyone with known risk factors for hepatitis C. Sexually transmitted infections (STIs)  You should be screened for sexually transmitted infections (STIs) including gonorrhea and chlamydia if:  You are sexually active and are younger than 44 years of age.  You are older than 44 years of age and your health care provider tells you that you are at risk for this type of infection.  Your sexual activity has changed since you were last screened and you are at an increased risk for chlamydia or gonorrhea. Ask your health care provider if you are at risk.  If you do not have HIV, but are at risk, it may be recommended that you take a prescription medicine daily to prevent HIV infection. This is called pre-exposure prophylaxis (PrEP). You are considered at risk if:  You are sexually active and do not regularly use condoms or know the HIV status  of your partner(s).  You take drugs by injection.  You are sexually active with a partner who has HIV. Talk with your health care provider about whether you are at high risk of being infected with HIV. If you choose to begin PrEP, you should first be tested for HIV. You should then be tested every 3 months for as long as you are taking PrEP.  PREGNANCY   If you are premenopausal and you may become pregnant, ask your health care provider about preconception counseling.  If you may become pregnant, take 400 to 800 micrograms (mcg) of folic acid every day.  If you want to prevent pregnancy, talk to your health care provider about birth control (contraception). OSTEOPOROSIS AND MENOPAUSE   Osteoporosis is a disease in which the bones lose minerals and strength with aging. This can result in serious bone fractures. Your risk for osteoporosis can be identified using a bone density scan.  If you are 48 years of age or older, or if you are at risk for osteoporosis and fractures, ask your health care provider if you should be screened.  Ask your health care provider whether you should take a calcium or vitamin D supplement to lower your risk for osteoporosis.  Menopause may have certain physical symptoms and risks.  Hormone replacement therapy may reduce some of these symptoms and risks. Talk to your health care provider about whether hormone replacement therapy is right for you.  HOME CARE INSTRUCTIONS   Schedule regular health, dental, and eye  exams.  Stay current with your immunizations.   Do not use any tobacco products including cigarettes, chewing tobacco, or electronic cigarettes.  If you are pregnant, do not drink alcohol.  If you are breastfeeding, limit how much and how often you drink alcohol.  Limit alcohol intake to no more than 1 drink per day for nonpregnant women. One drink equals 12 ounces of beer, 5 ounces of wine, or 1 ounces of hard liquor.  Do not use street  drugs.  Do not share needles.  Ask your health care provider for help if you need support or information about quitting drugs.  Tell your health care provider if you often feel depressed.  Tell your health care provider if you have ever been abused or do not feel safe at home. Document Released: 09/22/2010 Document Revised: 07/24/2013 Document Reviewed: 02/08/2013 [facility] Patient Information 2015 [place], [place]. This information is not intended to replace advice given to you by your health care provider. Make sure you discuss any questions you have with your health care provider.

## 2014-11-30 NOTE — Progress Notes (Signed)
Pre visit review using our clinic review tool, if applicable. No additional management support is needed unless otherwise documented below in the visit note. 

## 2014-11-30 NOTE — Assessment & Plan Note (Signed)
1) Anticipatory Guidance: Discussed importance of wearing a seatbelt while driving and not texting while driving; changing batteries in smoke detector at least once annually; wearing suntan lotion when outside; eating a balanced and moderate diet; getting physical activity at least 30 minutes per day.  2) Immunizations / Screenings / Labs:  Tetanus and influenza updated.    All other immunizations are up to date per recommendations. Due for a dental cleaning which is currently scheduled. Declines HIV. All other immunizations are up to date per recommendations. Obtain CBC, CMET, Lipid profile and TSH.  Overall well exam. Risk factors for cardiovascular disease include obesity, mostly sedentary lifestyle and hypertension. Discussed and educated regarding goal weight loss of 5-10% of current weight. This can be accomplished through increasing nutrient density and decreasing saturated fat while emphasizing a balanced and moderate meal plan. Increase physical activity to 30 minutes most days of the week. Hypertension is currently well controlled with medication regimen. Does experience some edema. Start hydrochlorothiazide. Getting married in the next month and will be going on her honeymoon and requests Cipro for potential traveler's diarrhea. Continue other healthy lifestyle choices especially abstaining from alcohol secondary to her cirrhosis. Follow up prevention exam in 1 year and follow up office visit pending blood work.

## 2014-11-30 NOTE — Progress Notes (Signed)
Subjective:    Patient ID: Angela Stark, female    DOB: 1970/04/10, 44 y.o.   MRN: [id number]  Chief Complaint  Patient presents with  . CPE    Not fasting, going out of state to the [place] and wants to know what shots she needs    HPI:  Angela Stark is a 44 y.o. female who presents today for an annual wellness visit.   1) Health Maintenance -   Diet - Averages about 3 meals per day consisting of vegetables, little fruit, processed food, occassional dairy; 3-4 cups of caffiene daily  Exercise - No structured exercise - walks a lot at work   2) Preventative Exams / Immunizations:  Dental -- Due for an exam already scheduled.  Vision -- Up to date   Health Maintenance  Topic Date Due  . HIV Screening  09/29/1985  . INFLUENZA VACCINE  10/22/2015  . PAP SMEAR  08/26/2017  . TETANUS/TDAP  11/29/2024    Immunization History  Administered Date(s) Administered  . Influenza,inj,Quad PF,36+ Mos 11/30/2014  . Pneumococcal Polysaccharide-23 08/19/2011  . Td 11/30/2014    No Known Allergies   Outpatient Prescriptions Prior to Visit  Medication Sig Dispense Refill  . amLODipine (NORVASC) 10 MG tablet Take 1 tablet (10 mg total) by mouth daily. 90 tablet 0  . buPROPion (WELLBUTRIN XL) 150 MG 24 hr tablet Take 1 tablet (150 mg total) by mouth daily. 60 tablet 2  . levothyroxine (SYNTHROID, LEVOTHROID) 25 MCG tablet Take 1 tablet (25 mcg total) by mouth daily before breakfast. 30 tablet 2  . Multiple Vitamin (MULITIVITAMIN WITH MINERALS) TABS Take 1 tablet by mouth daily.     No facility-administered medications prior to visit.     Past Medical History  Diagnosis Date  . Hypertension   . GERD (gastroesophageal reflux disease)   . Hyperlipidemia   . Sleep apnea   . Anxiety   . Depression   . Heart murmur     When pregnant  . Phlebitis      Past Surgical History  Procedure Laterality Date  . Ovary surgery       Family History  Problem  Relation Age of Onset  . Hypertension Mother   . Hyperlipidemia Mother   . Diabetes Mother   . Emphysema Mother   . Heart disease Father   . Hypertension Father   . Stroke Brother   . Lung cancer Maternal Grandmother      Social History   Social History  . Marital Status: Single    Spouse Name: N/A  . Number of Children: 3  . Years of Education: 14   Occupational History  . Sales    Social History Main Topics  . Smoking status: Current Every Day Smoker -- 0.50 packs/day for 20 years    Types: Cigarettes  . Smokeless tobacco: Never Used  . Alcohol Use: No  . Drug Use: No  . Sexual Activity: Yes    Birth Control/ Protection: None   Other Topics Concern  . Not on file   Social History Narrative   Fun: Fishing, camping   Denies religious beliefs effecting health care.     Review of Systems  Constitutional: Denies fever, chills, fatigue, or significant weight gain/loss. HENT: Head: Denies headache or neck pain Ears: Denies changes in hearing, ringing in ears, earache, drainage Nose: Denies discharge, stuffiness, itching, nosebleed, sinus pain Throat: Denies sore throat, hoarseness, dry mouth, sores, thrush Eyes: Denies loss/changes in vision, pain,  redness, blurry/double vision, flashing lights Cardiovascular: Denies chest pain/discomfort, tightness, palpitations, shortness of breath with activity, difficulty lying down, swelling, sudden awakening with shortness of breath Respiratory: Denies shortness of breath, cough, sputum production, wheezing Gastrointestinal: Denies dysphasia, heartburn, change in appetite, nausea, change in bowel habits, rectal bleeding, constipation, diarrhea, yellow skin or eyes Genitourinary: Denies frequency, urgency, burning/pain, blood in urine, incontinence, change in urinary strength. Musculoskeletal: Denies muscle/joint pain, stiffness, back pain, redness or swelling of joints, trauma Skin: Denies rashes, lumps, itching, dryness, color  changes, or hair/nail changes Neurological: Denies dizziness, fainting, seizures, weakness, numbness, tingling, tremor Psychiatric - Denies nervousness, stress, depression or memory loss Endocrine: Denies heat or cold intolerance, sweating, frequent urination, excessive thirst, changes in appetite Hematologic: Denies ease of bruising or bleeding     Objective:    BP 134/82 mmHg  Pulse 72  Temp(Src) 98.4 F (36.9 C) (Oral)  Resp 18  Ht 5' 6.75" (1.695 m)  Wt 242 lb 6.4 oz (109.952 kg)  BMI 38.27 kg/m2  SpO2 97% Nursing note and vital signs reviewed.  Physical Exam  Constitutional: She is oriented to person, place, and time. She appears well-developed and well-nourished.  HENT:  Head: Normocephalic.  Right Ear: Hearing, tympanic membrane, external ear and ear canal normal.  Left Ear: Hearing, tympanic membrane, external ear and ear canal normal.  Nose: Nose normal.  Mouth/Throat: Uvula is midline, oropharynx is clear and moist and mucous membranes are normal.  Eyes: Conjunctivae and EOM are normal. Pupils are equal, round, and reactive to light.  Neck: Neck supple. No JVD present. No tracheal deviation present. No thyromegaly present.  Cardiovascular: Normal rate, regular rhythm, normal heart sounds and intact distal pulses.   Pulmonary/Chest: Effort normal and breath sounds normal.  Abdominal: Soft. Bowel sounds are normal. She exhibits no distension and no mass. There is no tenderness. There is no rebound and no guarding.  Musculoskeletal: Normal range of motion. She exhibits no edema or tenderness.  Lymphadenopathy:    She has no cervical adenopathy.  Neurological: She is alert and oriented to person, place, and time. She has normal reflexes. No cranial nerve deficit. She exhibits normal muscle tone. Coordination normal.  Skin: Skin is warm and dry.  Psychiatric: She has a normal mood and affect. Her behavior is normal. Judgment and thought content normal.       Assessment  & Plan:   Problem List Items Addressed This Visit      Other   Routine general medical examination at a health care facility - Primary    1) Anticipatory Guidance: Discussed importance of wearing a seatbelt while driving and not texting while driving; changing batteries in smoke detector at least once annually; wearing suntan lotion when outside; eating a balanced and moderate diet; getting physical activity at least 30 minutes per day.  2) Immunizations / Screenings / Labs:  Tetanus and influenza updated.    All other immunizations are up to date per recommendations. Due for a dental cleaning which is currently scheduled. Declines HIV. All other immunizations are up to date per recommendations. Obtain CBC, CMET, Lipid profile and TSH.  Overall well exam. Risk factors for cardiovascular disease include obesity, mostly sedentary lifestyle and hypertension. Discussed and educated regarding goal weight loss of 5-10% of current weight. This can be accomplished through increasing nutrient density and decreasing saturated fat while emphasizing a balanced and moderate meal plan. Increase physical activity to 30 minutes most days of the week. Hypertension is currently well controlled with  medication regimen. Does experience some edema. Start hydrochlorothiazide. Getting married in the next month and will be going on her honeymoon and requests Cipro for potential traveler's diarrhea. Continue other healthy lifestyle choices especially abstaining from alcohol secondary to her cirrhosis. Follow up prevention exam in 1 year and follow up office visit pending blood work.         Relevant Medications   hydrochlorothiazide (MICROZIDE) 12.5 MG capsule   ciprofloxacin (CIPRO) 500 MG tablet   Other Relevant Orders   Flu Vaccine QUAD 36+ mos IM   Td vaccine greater than or equal to 7yo preservative free IM (Completed)   CBC   Comprehensive metabolic panel   Lipid panel   TSH    Other Visit Diagnoses     Encounter for immunization        Need for TD vaccine        Relevant Orders    Td vaccine greater than or equal to 7yo preservative free IM (Completed)

## 2014-12-03 ENCOUNTER — Encounter: Payer: BLUE CROSS/BLUE SHIELD | Admitting: Family

## 2014-12-13 ENCOUNTER — Other Ambulatory Visit (INDEPENDENT_AMBULATORY_CARE_PROVIDER_SITE_OTHER): Payer: BLUE CROSS/BLUE SHIELD

## 2014-12-13 ENCOUNTER — Telehealth: Payer: Self-pay | Admitting: Family

## 2014-12-13 DIAGNOSIS — R7989 Other specified abnormal findings of blood chemistry: Secondary | ICD-10-CM

## 2014-12-13 DIAGNOSIS — Z0000 Encounter for general adult medical examination without abnormal findings: Secondary | ICD-10-CM | POA: Diagnosis not present

## 2014-12-13 LAB — COMPREHENSIVE METABOLIC PANEL
ALT: 18 U/L (ref 0–35)
AST: 21 U/L (ref 0–37)
Albumin: 4.1 g/dL (ref 3.5–5.2)
Alkaline Phosphatase: 46 U/L (ref 39–117)
BILIRUBIN TOTAL: 1.2 mg/dL (ref 0.2–1.2)
BUN: 9 mg/dL (ref 6–23)
CHLORIDE: 97 meq/L (ref 96–112)
CO2: 28 meq/L (ref 19–32)
Calcium: 9.4 mg/dL (ref 8.4–10.5)
Creatinine, Ser: 0.62 mg/dL (ref 0.40–1.20)
GFR: 111.04 mL/min (ref >60.00)
GLUCOSE: 99 mg/dL (ref 70–99)
Potassium: 4 mEq/L (ref 3.5–5.1)
Sodium: 135 mEq/L (ref 135–145)
Total Protein: 7.2 g/dL (ref 6.0–8.3)

## 2014-12-13 LAB — LIPID PANEL
CHOL/HDL RATIO: 4
Cholesterol: 239 mg/dL — ABNORMAL HIGH (ref 0–200)
HDL: 57.8 mg/dL (ref >39.00)
NONHDL: 181.32
TRIGLYCERIDES: 241 mg/dL — AB (ref 0.0–149.0)
VLDL: 48.2 mg/dL — AB (ref 0.0–40.0)

## 2014-12-13 LAB — CBC
HCT: 47 % — ABNORMAL HIGH (ref 36.0–46.0)
HEMOGLOBIN: 15.7 g/dL — AB (ref 12.0–15.0)
MCHC: 33.5 g/dL (ref 30.0–36.0)
MCV: 94.4 fl (ref 78.0–100.0)
Platelets: 196 10*3/uL (ref 150.0–400.0)
RBC: 4.97 Mil/uL (ref 3.87–5.11)
RDW: 13.5 % (ref 11.5–15.5)
WBC: 7.4 10*3/uL (ref 4.0–10.5)

## 2014-12-13 LAB — TSH: TSH: 3.8 u[IU]/mL (ref 0.35–4.50)

## 2014-12-13 LAB — LDL CHOLESTEROL, DIRECT: Direct LDL: 176 mg/dL

## 2014-12-13 NOTE — Telephone Encounter (Signed)
Please inform patient that her blood work shows that her thyroid function, kidney function, liver function, electrolytes, and white/red blood cells are within a normal range. Her triglycerides or fat in her blood is slightly elevated as well as her total cholesterol. To reduce this the recommended treatment is to encourage aerobic exercise and avoid concentrated sources of sugar, trans fat and saturated fats. Therefore also work on increasing her physical activity outside of work to walking for about 30 minutes daily. We will plan to follow up in about 6 months to recheck her cholesterol.

## 2014-12-17 NOTE — Telephone Encounter (Signed)
LVM for pt to call back.

## 2015-01-03 ENCOUNTER — Other Ambulatory Visit: Payer: Self-pay | Admitting: Family

## 2015-03-22 ENCOUNTER — Telehealth: Payer: Self-pay | Admitting: *Deleted

## 2015-03-22 MED ORDER — AMLODIPINE BESYLATE 10 MG PO TABS: 10.0000 mg | ORAL_TABLET | Freq: Every day | ORAL | Status: DC

## 2015-03-22 MED ORDER — HYDROCHLOROTHIAZIDE 12.5 MG PO CAPS: ORAL_CAPSULE | ORAL | Status: DC

## 2015-03-22 NOTE — Telephone Encounter (Signed)
Received call pt states she is needing refill on her HCTZ & Amlodipine. [facility]....Raechel Chute/lmb

## 2015-04-16 ENCOUNTER — Encounter: Payer: Self-pay | Admitting: Internal Medicine

## 2015-04-16 ENCOUNTER — Ambulatory Visit (INDEPENDENT_AMBULATORY_CARE_PROVIDER_SITE_OTHER): Payer: 59 | Admitting: Internal Medicine

## 2015-04-16 VITALS — BP 138/90 | HR 74 | Wt 239.0 lb

## 2015-04-16 DIAGNOSIS — F1010 Alcohol abuse, uncomplicated: Secondary | ICD-10-CM | POA: Diagnosis not present

## 2015-04-16 DIAGNOSIS — F329 Major depressive disorder, single episode, unspecified: Secondary | ICD-10-CM

## 2015-04-16 DIAGNOSIS — H539 Unspecified visual disturbance: Secondary | ICD-10-CM | POA: Diagnosis not present

## 2015-04-16 DIAGNOSIS — Z111 Encounter for screening for respiratory tuberculosis: Secondary | ICD-10-CM | POA: Diagnosis not present

## 2015-04-16 DIAGNOSIS — F17200 Nicotine dependence, unspecified, uncomplicated: Secondary | ICD-10-CM

## 2015-04-16 DIAGNOSIS — F418 Other specified anxiety disorders: Secondary | ICD-10-CM

## 2015-04-16 DIAGNOSIS — F419 Anxiety disorder, unspecified: Secondary | ICD-10-CM

## 2015-04-16 HISTORY — DX: Nicotine dependence, unspecified, uncomplicated: F17.200

## 2015-04-16 HISTORY — DX: Unspecified visual disturbance: H53.9

## 2015-04-16 MED ORDER — AMLODIPINE BESYLATE 10 MG PO TABS: 10.0000 mg | ORAL_TABLET | Freq: Every day | ORAL | Status: DC

## 2015-04-16 NOTE — Assessment & Plan Note (Signed)
1/17 pt is remaining dry

## 2015-04-16 NOTE — Assessment & Plan Note (Addendum)
1/17 ?etiology Probable migraines Ibuprofen prn Ophth ref Dr Charlotte Sanes

## 2015-04-16 NOTE — Progress Notes (Signed)
Subjective:  Patient ID: Angela Stark, female    DOB: [birth year]  Age: 45 y.o. MRN: [id number]  CC: No chief complaint on file.   HPI Angela Stark presents for eye site change and pressure in the eyes and light sensitivity  x12 mo. Pt has stopped drinking. Pt is taking a MVI. Needs a TB test.   Outpatient Prescriptions Prior to Visit  Medication Sig Dispense Refill  . amLODipine (NORVASC) 10 MG tablet Take 1 tablet (10 mg total) by mouth daily. 90 tablet 2  . buPROPion (WELLBUTRIN XL) 150 MG 24 hr tablet Take 1 tablet (150 mg total) by mouth daily. 60 tablet 2  . hydrochlorothiazide (MICROZIDE) 12.5 MG capsule take 1 capsule by mouth once daily if needed 90 capsule 0  . levothyroxine (SYNTHROID, LEVOTHROID) 25 MCG tablet take 1 tablet by mouth once daily BEFORE BREAKFAST 30 tablet 2  . Multiple Vitamin (MULITIVITAMIN WITH MINERALS) TABS Take 1 tablet by mouth daily.    . ciprofloxacin (CIPRO) 500 MG tablet Take 1 tablet (500 mg total) by mouth 2 (two) times daily. (Patient not taking: Reported on 04/16/2015) 6 tablet 0   No facility-administered medications prior to visit.    ROS Review of Systems  Constitutional: Negative for chills, activity change, appetite change, fatigue and unexpected weight change.  HENT: Negative for congestion, mouth sores and sinus pressure.   Eyes: Positive for photophobia, pain and visual disturbance.  Respiratory: Negative for cough and chest tightness.   Gastrointestinal: Negative for nausea and abdominal pain.  Genitourinary: Negative for frequency, difficulty urinating and vaginal pain.  Musculoskeletal: Negative for back pain and gait problem.  Skin: Negative for pallor and rash.  Neurological: Negative for dizziness, tremors, weakness, numbness and headaches.  Psychiatric/Behavioral: Negative for confusion, sleep disturbance and dysphoric mood.    Objective:  BP 138/90 mmHg  Pulse 74  Wt 239 lb (108.41 kg)  SpO2 93%  BP  Readings from Last 3 Encounters:  04/16/15 138/90  11/30/14 134/82  08/06/14 138/96    Wt Readings from Last 3 Encounters:  04/16/15 239 lb (108.41 kg)  11/30/14 242 lb 6.4 oz (109.952 kg)  08/06/14 238 lb (107.956 kg)    Physical Exam  Constitutional: She appears well-developed. No distress.  HENT:  Head: Normocephalic.  Right Ear: External ear normal.  Left Ear: External ear normal.  Nose: Nose normal.  Mouth/Throat: Oropharynx is clear and moist.  Eyes: Conjunctivae are normal. Pupils are equal, round, and reactive to light. Right eye exhibits no discharge. Left eye exhibits no discharge.  Neck: Normal range of motion. Neck supple. No JVD present. No tracheal deviation present. No thyromegaly present.  Cardiovascular: Normal rate, regular rhythm and normal heart sounds.   Pulmonary/Chest: No stridor. No respiratory distress. She has no wheezes.  Abdominal: Soft. Bowel sounds are normal. She exhibits no distension and no mass. There is no tenderness. There is no rebound and no guarding.  Musculoskeletal: She exhibits no edema or tenderness.  Lymphadenopathy:    She has no cervical adenopathy.  Neurological: She displays normal reflexes. No cranial nerve deficit. She exhibits normal muscle tone. Coordination normal.  Skin: No rash noted. No erythema.  Psychiatric: She has a normal mood and affect. Her behavior is normal. Judgment and thought content normal.  Obese  Lab Results  Component Value Date   WBC 7.4 12/13/2014   HGB 15.7* 12/13/2014   HCT 47.0* 12/13/2014   PLT 196.0 12/13/2014   GLUCOSE 99 12/13/2014  CHOL 239* 12/13/2014   TRIG 241.0* 12/13/2014   HDL 57.80 12/13/2014   LDLDIRECT 176.0 12/13/2014   ALT 18 12/13/2014   AST 21 12/13/2014   NA 135 12/13/2014   K 4.0 12/13/2014   CL 97 12/13/2014   CREATININE 0.62 12/13/2014   BUN 9 12/13/2014   CO2 28 12/13/2014   TSH 3.80 12/13/2014   INR 1.08 08/17/2011    US Breast Ltd Uni Left Inc  Axilla  08/27/2014  CLINICAL DATA:  Focal tenderness involving the upper portion of the left nipple over the past 2-3 months. Prior bloody right nipple discharge, only when expressed, back in 2012. Patient states that the right nipple discharge has not recurred since 2012. Annual evaluation, right breast. EXAM: DIGITAL DIAGNOSTIC BILATERAL MAMMOGRAM WITH 3D TOMOSYNTHESIS WITH CAD ULTRASOUND LEFT BREAST COMPARISON:  Mammography and left breast ultrasound 10/08/2010. ACR Breast Density Category a: The breast tissue is almost entirely fatty. FINDINGS: CC and MLO views of both breasts with tomosynthesis and a spot tangential view of the area of palpable concern involving the left nipple were obtained. No mammographic abnormality in the area of palpable concern in the upper subareolar region of the left breast. No findings suspicious for malignancy in the left breast. No findings suspicious for malignancy in the right breast. Mammographic images were processed with CAD. On physical exam, there is no palpable abnormality in the upper subareolar left breast. The patient does describe tenderness to palpation. Targeted left breast ultrasound is performed, showing normal fibrofatty and a focus of normal fibroglandular tissue at the 12 o'clock subareolar position in the area of focal tenderness. No cyst, solid mass or abnormal acoustic shadowing is identified. IMPRESSION: 1. No mammographic or sonographic evidence of malignancy, left breast. 2. No mammographic evidence of malignancy, right breast. RECOMMENDATION: Screening mammogram in one year.(Code:SM-B-01Y) I have discussed the findings and recommendations with the patient. Results were also provided in writing at the conclusion of the visit. If applicable, a reminder letter will be sent to the patient regarding the next appointment. BI-RADS CATEGORY  1: Negative. Electronically Signed   By: Hulan Saas M.D.   On: 08/27/2014 17:09   Mm Diag Breast Tomo  Bilateral  08/27/2014  CLINICAL DATA:  Focal tenderness involving the upper portion of the left nipple over the past 2-3 months. Prior bloody right nipple discharge, only when expressed, back in 2012. Patient states that the right nipple discharge has not recurred since 2012. Annual evaluation, right breast. EXAM: DIGITAL DIAGNOSTIC BILATERAL MAMMOGRAM WITH 3D TOMOSYNTHESIS WITH CAD ULTRASOUND LEFT BREAST COMPARISON:  Mammography and left breast ultrasound 10/08/2010. ACR Breast Density Category a: The breast tissue is almost entirely fatty. FINDINGS: CC and MLO views of both breasts with tomosynthesis and a spot tangential view of the area of palpable concern involving the left nipple were obtained. No mammographic abnormality in the area of palpable concern in the upper subareolar region of the left breast. No findings suspicious for malignancy in the left breast. No findings suspicious for malignancy in the right breast. Mammographic images were processed with CAD. On physical exam, there is no palpable abnormality in the upper subareolar left breast. The patient does describe tenderness to palpation. Targeted left breast ultrasound is performed, showing normal fibrofatty and a focus of normal fibroglandular tissue at the 12 o'clock subareolar position in the area of focal tenderness. No cyst, solid mass or abnormal acoustic shadowing is identified. IMPRESSION: 1. No mammographic or sonographic evidence of malignancy, left breast. 2. No mammographic  evidence of malignancy, right breast. RECOMMENDATION: Screening mammogram in one year.(Code:SM-B-01Y) I have discussed the findings and recommendations with the patient. Results were also provided in writing at the conclusion of the visit. If applicable, a reminder letter will be sent to the patient regarding the next appointment. BI-RADS CATEGORY  1: Negative. Electronically Signed   By: Hulan Saas M.D.   On: 08/27/2014 17:09    Assessment & Plan:   There  are no diagnoses linked to this encounter. I have discontinued Ms. Bermingham ciprofloxacin. I am also having her maintain her multivitamin with minerals, buPROPion, levothyroxine, amLODipine, and hydrochlorothiazide.  No orders of the defined types were placed in this encounter.     Follow-up: No Follow-up on file.  Sonda Primes, MD

## 2015-04-16 NOTE — Addendum Note (Signed)
Addended by: Merrilyn Puma on: 04/16/2015 11:45 AM   Modules accepted: Orders

## 2015-04-16 NOTE — Progress Notes (Signed)
Pre visit review using our clinic review tool, if applicable. No additional management support is needed unless otherwise documented below in the visit note. 

## 2015-04-16 NOTE — Assessment & Plan Note (Signed)
Wellbutrin should help  discussed

## 2015-04-16 NOTE — Assessment & Plan Note (Signed)
On Wellbutrin XL 

## 2015-04-18 LAB — TB SKIN TEST
Induration: 0 mm
TB SKIN TEST: NEGATIVE

## 2015-07-30 ENCOUNTER — Other Ambulatory Visit (INDEPENDENT_AMBULATORY_CARE_PROVIDER_SITE_OTHER): Payer: 59

## 2015-07-30 ENCOUNTER — Encounter: Payer: Self-pay | Admitting: Family

## 2015-07-30 ENCOUNTER — Other Ambulatory Visit: Payer: Self-pay | Admitting: Family

## 2015-07-30 ENCOUNTER — Ambulatory Visit (INDEPENDENT_AMBULATORY_CARE_PROVIDER_SITE_OTHER): Payer: 59 | Admitting: Family

## 2015-07-30 VITALS — BP 142/98 | HR 73 | Temp 98.3°F | Resp 16 | Ht 66.75 in | Wt 226.8 lb

## 2015-07-30 DIAGNOSIS — H539 Unspecified visual disturbance: Secondary | ICD-10-CM

## 2015-07-30 DIAGNOSIS — I10 Essential (primary) hypertension: Secondary | ICD-10-CM

## 2015-07-30 DIAGNOSIS — R1011 Right upper quadrant pain: Secondary | ICD-10-CM

## 2015-07-30 DIAGNOSIS — F1010 Alcohol abuse, uncomplicated: Secondary | ICD-10-CM

## 2015-07-30 DIAGNOSIS — R7989 Other specified abnormal findings of blood chemistry: Secondary | ICD-10-CM

## 2015-07-30 DIAGNOSIS — R945 Abnormal results of liver function studies: Principal | ICD-10-CM

## 2015-07-30 HISTORY — DX: Essential (primary) hypertension: I10

## 2015-07-30 HISTORY — DX: Unspecified visual disturbance: H53.9

## 2015-07-30 HISTORY — DX: Right upper quadrant pain: R10.11

## 2015-07-30 LAB — BASIC METABOLIC PANEL
BUN: 10 mg/dL (ref 6–23)
CHLORIDE: 94 meq/L — AB (ref 96–112)
CO2: 33 mEq/L — ABNORMAL HIGH (ref 19–32)
CREATININE: 0.67 mg/dL (ref 0.40–1.20)
Calcium: 9.4 mg/dL (ref 8.4–10.5)
GFR: 101.24 mL/min (ref >60.00)
Glucose, Bld: 96 mg/dL (ref 70–99)
Potassium: 2.9 mEq/L — ABNORMAL LOW (ref 3.5–5.1)
Sodium: 139 mEq/L (ref 135–145)

## 2015-07-30 LAB — HEPATIC FUNCTION PANEL
ALBUMIN: 4.4 g/dL (ref 3.5–5.2)
ALK PHOS: 41 U/L (ref 39–117)
ALT: 123 U/L — AB (ref 0–35)
AST: 266 U/L — ABNORMAL HIGH (ref 0–37)
BILIRUBIN DIRECT: 0.4 mg/dL — AB (ref 0.0–0.3)
Total Bilirubin: 1 mg/dL (ref 0.2–1.2)
Total Protein: 7.2 g/dL (ref 6.0–8.3)

## 2015-07-30 LAB — CBC
HCT: 42.8 % (ref 36.0–46.0)
Hemoglobin: 14.6 g/dL (ref 12.0–15.0)
MCHC: 34.2 g/dL (ref 30.0–36.0)
MCV: 100.4 fl — ABNORMAL HIGH (ref 78.0–100.0)
Platelets: 160 10*3/uL (ref 150.0–400.0)
RBC: 4.27 Mil/uL (ref 3.87–5.11)
RDW: 17.4 % — AB (ref 11.5–15.5)
WBC: 5.3 10*3/uL (ref 4.0–10.5)

## 2015-07-30 MED ORDER — POTASSIUM CHLORIDE CRYS ER 20 MEQ PO TBCR: 20.0000 meq | EXTENDED_RELEASE_TABLET | Freq: Every day | ORAL | Status: DC

## 2015-07-30 MED ORDER — FOLIC ACID 1 MG PO TABS: 1.0000 mg | ORAL_TABLET | Freq: Every day | ORAL | Status: DC

## 2015-07-30 MED ORDER — LISINOPRIL-HYDROCHLOROTHIAZIDE 20-12.5 MG PO TABS: 1.0000 | ORAL_TABLET | Freq: Every day | ORAL | Status: DC

## 2015-07-30 NOTE — Assessment & Plan Note (Signed)
Right upper quadrant pain with concern for alcohol use and liver inflammation. Symptoms are generally improving. Obtain hepatic function panel, CBC, and basic metabolic profile. Encouraged to avoid alcohol. Follow-up pending blood work for possible imaging or referral to gastroenterology.

## 2015-07-30 NOTE — Progress Notes (Signed)
Pre visit review using our clinic review tool, if applicable. No additional management support is needed unless otherwise documented below in the visit note. 

## 2015-07-30 NOTE — Progress Notes (Signed)
Subjective:    Patient ID: Mikle Bosworth, female    DOB: 1970-06-19, 45 y.o.   MRN: [id number]  Chief Complaint  Patient presents with  . Medication Management    went off her medications for a while and then started them back several days ago, BP has been high, also has an issues with right eye    HPI:  ANGELIYAH KIRKEY is a 45 y.o. female who  has a past medical history of Hypertension; GERD (gastroesophageal reflux disease); Hyperlipidemia; Sleep apnea; Anxiety; Depression; Heart murmur; and Phlebitis. and presents today for a follow up office visit.   1.) Hypertension - Currently maintained on amlodipine and hydrochlorothiazide. Reports poor compliance with the medication regimen having stopped the medication and restarted several days ago after noting her blood pressure being high and not feeling well. Does have some headaches. Denies other symptoms of end organ damage. Home blood pressures that have elevated.   BP Readings from Last 3 Encounters:  07/30/15 142/98  04/16/15 138/90  11/30/14 134/82    2.) Eye issue - Previously seen in the office for right eye issues. Described as pressure and some blurriness. Notes a small area on her eye. No discharge or pain.   3.) Alcohol abuse/Abdominal pain - This is a new problem. Associated symptom of abdominal pain and diarrhea has been going on for about 4 days following an episode of alcohol consumption. Describes the diarreha as fairly straight water with a slight yellow tinge to it. Notes that her symptoms have been gradually been improving with her increasing diet. No vomiting or blood in her stool. Denies ease of bleeding or bruising. Has not had a drink in the last 4 days.   No Known Allergies   Current Outpatient Prescriptions on File Prior to Visit  Medication Sig Dispense Refill  . amLODipine (NORVASC) 10 MG tablet Take 1 tablet (10 mg total) by mouth daily. 90 tablet 3  . buPROPion (WELLBUTRIN XL) 150 MG 24 hr  tablet Take 1 tablet (150 mg total) by mouth daily. 60 tablet 2  . levothyroxine (SYNTHROID, LEVOTHROID) 25 MCG tablet take 1 tablet by mouth once daily BEFORE BREAKFAST 30 tablet 2  . Multiple Vitamin (MULITIVITAMIN WITH MINERALS) TABS Take 1 tablet by mouth daily.     No current facility-administered medications on file prior to visit.     Past Surgical History  Procedure Laterality Date  . Ovary surgery      Past Medical History  Diagnosis Date  . Hypertension   . GERD (gastroesophageal reflux disease)   . Hyperlipidemia   . Sleep apnea   . Anxiety   . Depression   . Heart murmur     When pregnant  . Phlebitis      Review of Systems  Constitutional: Negative for fever and chills.  Respiratory: Negative for chest tightness, shortness of breath and wheezing.   Gastrointestinal: Positive for abdominal pain and diarrhea. Negative for nausea, vomiting and blood in stool.  Neurological: Positive for dizziness and headaches.      Objective:    BP 142/98 mmHg  Pulse 73  Temp(Src) 98.3 F (36.8 C) (Oral)  Resp 16  Ht 5' 6.75" (1.695 m)  Wt 226 lb 12.8 oz (102.876 kg)  BMI 35.81 kg/m2  SpO2 99% Nursing note and vital signs reviewed.  Physical Exam  Constitutional: She is oriented to person, place, and time. She appears well-developed and well-nourished. No distress.  Cardiovascular: Normal rate, regular rhythm, normal heart  sounds and intact distal pulses.   Pulmonary/Chest: Effort normal and breath sounds normal.  Abdominal: Soft. Normal appearance and bowel sounds are normal. There is tenderness in the right upper quadrant. There is no rigidity, no rebound, no guarding, no tenderness at McBurney's point and negative Murphy's sign.  Neurological: She is alert and oriented to person, place, and time.  Skin: Skin is warm and dry.  Psychiatric: She has a normal mood and affect. Her behavior is normal. Judgment and thought content normal.       Assessment & Plan:    Problem List Items Addressed This Visit      Cardiovascular and Mediastinum   Essential hypertension - Primary    Hypertension remains above goal 140/90 with current regimen and less than adequate patient compliance with medication regimen. Emphasize importance of education reducing end organ damage in the future. Continue current dosage of amlodipine. Start lisinopril-hydrochlorothiazide. Encouraged to monitor blood pressure at home and decrease sodium in her diet. Follow-up in 3 weeks for blood pressure check.      Relevant Medications   lisinopril-hydrochlorothiazide (ZESTORETIC) 20-12.5 MG tablet     Other   Alcohol abuse   Relevant Medications   lisinopril-hydrochlorothiazide (ZESTORETIC) 20-12.5 MG tablet   Visual disturbance    Visual disturbance of undetermined origin with previous referral to ophthalmology which was never completed. Ophthalmology exam is normal today. Refer to ophthalmology for further assessment and evaluation.      Relevant Orders   Ambulatory referral to Ophthalmology   Right upper quadrant pain    Right upper quadrant pain with concern for alcohol use and liver inflammation. Symptoms are generally improving. Obtain hepatic function panel, CBC, and basic metabolic profile. Encouraged to avoid alcohol. Follow-up pending blood work for possible imaging or referral to gastroenterology.      Relevant Orders   Hepatic function panel   Basic Metabolic Panel (BMET)   CBC       I have discontinued Ms. Chauncey CruelGarrison's hydrochlorothiazide. I am also having her start on lisinopril-hydrochlorothiazide. Additionally, I am having her maintain her multivitamin with minerals, buPROPion, levothyroxine, and amLODipine.   Meds ordered this encounter  Medications  . lisinopril-hydrochlorothiazide (ZESTORETIC) 20-12.5 MG tablet    Sig: Take 1 tablet by mouth daily.    Dispense:  30 tablet    Refill:  0    Order Specific Question:  Supervising Provider    Answer:   Hillard DankerRAWFORD, ELIZABETH A [[id number]]     Follow-up: Return in about 3 weeks (around 08/20/2015) for Blood pressure.  Jeanine Luzalone, Ogle Hoeffner, FNP

## 2015-07-30 NOTE — Assessment & Plan Note (Signed)
Visual disturbance of undetermined origin with previous referral to ophthalmology which was never completed. Ophthalmology exam is normal today. Refer to ophthalmology for further assessment and evaluation.

## 2015-07-30 NOTE — Patient Instructions (Addendum)
Thank you for choosing [facility].  Summary/Instructions:  Please continue to take her medications as prescribed.  Please monitor your blood pressure at home and decreased the amount of salt intake.  Your prescription(s) have been submitted to your pharmacy or been printed and provided for you. Please take as directed and contact our office if you believe you are having problem(s) with the medication(s) or have any questions.  Please stop by the lab on the basement level of the building for your blood work. Your results will be released to MyChart (or called to you) after review, usually within 72 hours after test completion. If any changes need to be made, you will be notified at that same time.  If your symptoms worsen or fail to improve, please contact our office for further instruction, or in case of emergency go directly to the emergency room at the closest medical facility.

## 2015-07-30 NOTE — Assessment & Plan Note (Signed)
Hypertension remains above goal 140/90 with current regimen and less than adequate patient compliance with medication regimen. Emphasize importance of education reducing end organ damage in the future. Continue current dosage of amlodipine. Start lisinopril-hydrochlorothiazide. Encouraged to monitor blood pressure at home and decrease sodium in her diet. Follow-up in 3 weeks for blood pressure check.

## 2015-07-31 ENCOUNTER — Telehealth: Payer: Self-pay | Admitting: Family

## 2015-07-31 NOTE — Telephone Encounter (Signed)
Patient called in requesting lab results and would like to know how increased liver enzymes are.

## 2015-07-31 NOTE — Telephone Encounter (Signed)
Please advise 

## 2015-08-02 ENCOUNTER — Telehealth: Payer: Self-pay | Admitting: Family

## 2015-08-02 ENCOUNTER — Encounter: Payer: Self-pay | Admitting: Family

## 2015-08-02 ENCOUNTER — Ambulatory Visit
Admission: RE | Admit: 2015-08-02 | Discharge: 2015-08-02 | Disposition: A | Discharge status: Home / Self Care | Payer: 59 | Source: Ambulatory Visit | Attending: Family | Admitting: Family

## 2015-08-02 DIAGNOSIS — R945 Abnormal results of liver function studies: Principal | ICD-10-CM

## 2015-08-02 DIAGNOSIS — K760 Fatty (change of) liver, not elsewhere classified: Secondary | ICD-10-CM | POA: Diagnosis not present

## 2015-08-02 DIAGNOSIS — F1010 Alcohol abuse, uncomplicated: Secondary | ICD-10-CM

## 2015-08-02 DIAGNOSIS — R7989 Other specified abnormal findings of blood chemistry: Secondary | ICD-10-CM

## 2015-08-02 NOTE — Telephone Encounter (Signed)
FYI, pt just want to let Tammy SoursGreg know that she is done with her Abdominal ultra sound.

## 2015-08-02 NOTE — Telephone Encounter (Signed)
Noted  

## 2015-08-12 DIAGNOSIS — H524 Presbyopia: Secondary | ICD-10-CM | POA: Diagnosis not present

## 2015-08-12 DIAGNOSIS — H5213 Myopia, bilateral: Secondary | ICD-10-CM | POA: Diagnosis not present

## 2015-08-12 DIAGNOSIS — H52223 Regular astigmatism, bilateral: Secondary | ICD-10-CM | POA: Diagnosis not present

## 2015-08-12 DIAGNOSIS — H2513 Age-related nuclear cataract, bilateral: Secondary | ICD-10-CM | POA: Diagnosis not present

## 2015-08-12 DIAGNOSIS — H04123 Dry eye syndrome of bilateral lacrimal glands: Secondary | ICD-10-CM | POA: Diagnosis not present

## 2015-08-26 ENCOUNTER — Other Ambulatory Visit: Payer: Self-pay | Admitting: *Deleted

## 2015-08-26 DIAGNOSIS — F1010 Alcohol abuse, uncomplicated: Secondary | ICD-10-CM

## 2015-08-26 MED ORDER — BUPROPION HCL ER (XL) 150 MG PO TB24: 150.0000 mg | ORAL_TABLET | Freq: Every day | ORAL | Status: DC

## 2015-08-26 MED ORDER — AMLODIPINE BESYLATE 10 MG PO TABS: 10.0000 mg | ORAL_TABLET | Freq: Every day | ORAL | Status: DC

## 2015-08-26 MED ORDER — LEVOTHYROXINE SODIUM 25 MCG PO TABS: ORAL_TABLET | ORAL | Status: DC

## 2015-08-26 MED ORDER — LISINOPRIL-HYDROCHLOROTHIAZIDE 20-12.5 MG PO TABS: 1.0000 | ORAL_TABLET | Freq: Every day | ORAL | Status: DC

## 2015-08-26 MED ORDER — POTASSIUM CHLORIDE CRYS ER 20 MEQ PO TBCR: 20.0000 meq | EXTENDED_RELEASE_TABLET | Freq: Every day | ORAL | Status: DC

## 2015-08-26 NOTE — Telephone Encounter (Signed)
Left msg on triage stating she is needing refills on all medications. She has change pharmacy [facility] script sent there. Sent electronically...Raechel Chute/lmb

## 2015-08-28 MED FILL — BUPROPION HCL XL 150 MG TAB: 150 | 90 days supply | Qty: 90 | Fill #0 | Status: TO

## 2015-08-28 MED FILL — AMLODIPINE BESYLATE 10 MG T: 10 | 90 days supply | Qty: 90 | Fill #0

## 2015-08-28 MED FILL — LISINOPRIL-HCTZ 20-12.5 MG: 20-12.5 | 90 days supply | Qty: 90 | Fill #0

## 2015-08-28 MED FILL — KLOR-CON M20 TABLET: 20 | 90 days supply | Qty: 90 | Fill #0

## 2015-08-28 MED FILL — LEVOTHYROXINE 25 MCG TABLET: 25 | 90 days supply | Qty: 90 | Fill #0

## 2015-09-05 ENCOUNTER — Ambulatory Visit (INDEPENDENT_AMBULATORY_CARE_PROVIDER_SITE_OTHER)
Admission: RE | Admit: 2015-09-05 | Discharge: 2015-09-05 | Disposition: A | Discharge status: Home / Self Care | Payer: 59 | Source: Ambulatory Visit | Attending: Family Medicine | Admitting: Family Medicine

## 2015-09-05 ENCOUNTER — Encounter: Payer: Self-pay | Admitting: Family Medicine

## 2015-09-05 ENCOUNTER — Encounter: Payer: Self-pay | Admitting: *Deleted

## 2015-09-05 ENCOUNTER — Ambulatory Visit (INDEPENDENT_AMBULATORY_CARE_PROVIDER_SITE_OTHER): Payer: 59 | Admitting: Family Medicine

## 2015-09-05 VITALS — BP 124/82 | HR 82 | Wt 229.0 lb

## 2015-09-05 DIAGNOSIS — M1712 Unilateral primary osteoarthritis, left knee: Secondary | ICD-10-CM

## 2015-09-05 DIAGNOSIS — M25571 Pain in right ankle and joints of right foot: Secondary | ICD-10-CM

## 2015-09-05 DIAGNOSIS — M25562 Pain in left knee: Secondary | ICD-10-CM

## 2015-09-05 DIAGNOSIS — M13162 Monoarthritis, not elsewhere classified, left knee: Secondary | ICD-10-CM | POA: Diagnosis not present

## 2015-09-05 DIAGNOSIS — M216X9 Other acquired deformities of unspecified foot: Secondary | ICD-10-CM

## 2015-09-05 DIAGNOSIS — M179 Osteoarthritis of knee, unspecified: Secondary | ICD-10-CM | POA: Diagnosis not present

## 2015-09-05 HISTORY — DX: Unilateral primary osteoarthritis, left knee: M17.12

## 2015-09-05 HISTORY — DX: Other acquired deformities of unspecified foot: M21.6X9

## 2015-09-05 HISTORY — DX: Pain in right ankle and joints of right foot: M25.571

## 2015-09-05 NOTE — Patient Instructions (Signed)
Good to see you  Xray downstairs.  Ice 20 minutes 2 times daily. Usually after activity and before bed. Exercises 3 times a week.  pennsaid pinkie amount topically 2 times daily as needed.  Vitamin D 2000IU daily  Turmeric 500mg  daily  Biking and swimming would be helpful. Avoid deep squats You are able to do all activities in your exercise class but avoid deep squats or jumping! [employer] orthotics "total support" online would be great  Good shoes with rigid bottom.  Angela Stark, Dansko, Merrell or New balance greater then 700 I think that will help the ankle Good luck on the test  See me again in 4 weeks.

## 2015-09-05 NOTE — Assessment & Plan Note (Signed)
I believe the patient's right ankle pain is secondary to lateral column overload. Patient is overweight, is oversupination of the foot, increased likely strain over the peroneal tendons. Discussed proper shoes, over-the-counter orthotics, icing and topical anti-inflammatories. Will avoid oral anti-inflammatories secondary to patient's comorbidities. Patient come back again in 4 weeks. Continued have trouble we'll consider bracing and possible formal physical therapy.

## 2015-09-05 NOTE — Progress Notes (Signed)
Angela ScaleZach Stark D.O. [facility] [address] [place] [address] Phone: [phone] Subjective:    I'm seeing this patient by the request  of:  Jeanine Luzalone, Gregory, FNP   CC: Left knee pain, right ankle pain  OZH:[id number]:Subjective Angela BosworthCatherine W Stark is a 45 y.o. female coming in with complaint of right ankle and left knee pain.  Recurrent patient's right ankle pain and has been more of a dull, throbbing aching sensation. Nothing that gives out. Worse with walking long distances. States that she breaks down her shoes on the outside. Patient states that there is a significant soreness. Never stopped her from activity but sometimes does give her difficulty. Rates the severity pain is 3 out of 10. Has sprained her ankle on multiple occasions she states.  Patient is having more of a left knee pain. Seems to be anterior. Worse with going upstairs or deep squats. Patient has been doing an exercise class and unfortunately fell. Needs a letter to return. Patient states that it has a grinding sensation that is painful. Sometimes associated with swelling. Patient denies giving out on her on a regular basis. States that if she avoids certain activities it does seem to feel better. Able to do daily activities fine and sleep appropriately. Rates the severity pain overall as 7 out of 10.     Past Medical History  Diagnosis Date  . Hypertension   . GERD (gastroesophageal reflux disease)   . Hyperlipidemia   . Sleep apnea   . Anxiety   . Depression   . Heart murmur     When pregnant  . Phlebitis    Past Surgical History  Procedure Laterality Date  . Ovary surgery     Social History   Social History  . Marital Status: Married    Spouse Name: N/A  . Number of Children: 3  . Years of Education: 14   Occupational History  . Sales    Social History Main Topics  . Smoking status: Current Every Day Smoker -- 0.50 packs/day for 20 years    Types: Cigarettes  . Smokeless tobacco:  Never Used  . Alcohol Use: No  . Drug Use: No  . Sexual Activity: Yes    Birth Control/ Protection: None   Other Topics Concern  . None   Social History Narrative   Fun: Fishing, camping   Denies religious beliefs effecting health care.    No Known Allergies Family History  Problem Relation Age of Onset  . Hypertension Mother   . Hyperlipidemia Mother   . Diabetes Mother   . Emphysema Mother   . Heart disease Father   . Hypertension Father   . Stroke Brother   . Lung cancer Maternal Grandmother     Past medical history, social, surgical and family history all reviewed in electronic medical record.  No pertanent information unless stated regarding to the chief complaint.   Review of Systems: No headache, visual changes, nausea, vomiting, diarrhea, constipation, dizziness, abdominal pain, skin rash, fevers, chills, night sweats, weight loss, swollen lymph nodes, body aches, joint swelling, muscle aches, chest pain, shortness of breath, mood changes.   Objective Blood pressure 124/82, pulse 82, weight 229 lb (103.874 kg).  General: No apparent distress alert and oriented x3 mood and affect normal, dressed appropriately.  HEENT: Pupils equal, extraocular movements intact  Respiratory: Patient's speak in full sentences and does not appear short of breath  Cardiovascular: No lower extremity edema, non tender, no erythema  Skin:  Warm dry intact with no signs of infection or rash on extremities or on axial skeleton.  Abdomen: Soft nontender  Neuro: Cranial nerves II through XII are intact, neurovascularly intact in all extremities with 2+ DTRs and 2+ pulses.  Lymph: No lymphadenopathy of posterior or anterior cervical chain or axillae bilaterally.  Gait normal with good balance and coordination.  MSK:  Non tender with full range of motion and good stability and symmetric strength and tone of shoulders, elbows, wrist, hips bilaterally.  Knee: Left Lateral tilt of the patella Tender  over the patellofemoral joint and mildly over the medial joint ROM full in flexion and extension and lower leg rotation. Ligaments with solid consistent endpoints including ACL, PCL, LCL, MCL. Negative Mcmurray's, Apley's, and Thessalonian tests. painful patellar compression. Patellar glide with moderate crepitus. Patellar and quadriceps tendons unremarkable. Hamstring and quadriceps strength is normal.  Contralateral knee has crepitus but no pain  Ankle: Right Mild lateral swelling noted Range of motion is full in all directions. Strength is 5/5 in all directions. Stable lateral and medial ligaments; squeeze test and kleiger test unremarkable; Talar dome nontender; No pain at base of 5th MT; No tenderness over cuboid; No tenderness over N spot or navicular prominence No tenderness on posterior aspects of lateral and medial malleolus Minimal discomfort over the peroneal tendons but no displacement or subluxation noted. Negative tarsal tunnel tinel's Able to walk 4 steps.  Foot exam shows patient does have oversupination of the hindfoot. Patient does have some breakdown of the transverse arch bilaterally with bunion formation of the right first toe.   Procedure note 97110; 15 minutes spent for Therapeutic exercises as stated in above notes.  This included exercises focusing on stretching, strengthening, with significant focus on eccentric aspects.  Flexion and extension exercises working on vastus medialis oblique and hip abductor strengthening exercises. Stretching of the hamstrings as well. Proper technique shown and discussed handout in great detail with ATC.  All questions were discussed and answered.     Impression and Recommendations:     This case required medical decision making of moderate complexity.      Note: This dictation was prepared with Dragon dictation along with smaller phrase technology. Any transcriptional errors that result from this process are  unintentional.

## 2015-09-05 NOTE — Assessment & Plan Note (Signed)
Patient does have some patellofemoral arthritis I appreciated on exam today. X-rays are pending. Given home exercises and work with Event organiserathletic trainer. We'll do topical anti-inflammatories. We discussed icing regimen. Patient will continue to be active. Patient come back and see me again in 4 weeks for further evaluation and treatment. At that time if continuing have pain we'll consider injection and formal physical therapy.

## 2015-10-03 ENCOUNTER — Ambulatory Visit: Payer: 59 | Admitting: Family Medicine

## 2015-12-09 ENCOUNTER — Ambulatory Visit (INDEPENDENT_AMBULATORY_CARE_PROVIDER_SITE_OTHER): Payer: 59 | Admitting: Geriatric Medicine

## 2015-12-09 DIAGNOSIS — Z23 Encounter for immunization: Secondary | ICD-10-CM

## 2015-12-09 DIAGNOSIS — Z111 Encounter for screening for respiratory tuberculosis: Secondary | ICD-10-CM | POA: Diagnosis not present

## 2015-12-11 LAB — TB SKIN TEST
Induration: 0 mm
TB SKIN TEST: NEGATIVE

## 2016-02-05 ENCOUNTER — Telehealth: Payer: Self-pay | Admitting: Family

## 2016-02-05 NOTE — Telephone Encounter (Signed)
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

## 2016-02-06 NOTE — Telephone Encounter (Signed)
LVM letting pt know that proof of flu shot was up front ready for pick up

## 2016-02-26 MED FILL — BUPROPION HCL XL 150 MG TAB: 150 | 90 days supply | Qty: 90 | Fill #0 | Status: TO

## 2016-03-31 DIAGNOSIS — L600 Ingrowing nail: Secondary | ICD-10-CM | POA: Diagnosis not present

## 2016-04-14 MED FILL — LISINOPRIL-HCTZ 20-12.5 MG: 20-12.5 | 90 days supply | Qty: 90 | Fill #1

## 2016-04-14 MED FILL — AMLODIPINE BESYLATE 10 MG T: 10 | 90 days supply | Qty: 90 | Fill #1

## 2016-06-01 DIAGNOSIS — T1490XA Injury, unspecified, initial encounter: Secondary | ICD-10-CM | POA: Diagnosis not present

## 2016-06-01 DIAGNOSIS — R55 Syncope and collapse: Secondary | ICD-10-CM | POA: Diagnosis not present

## 2016-06-01 DIAGNOSIS — F4489 Other dissociative and conversion disorders: Secondary | ICD-10-CM | POA: Diagnosis not present

## 2016-06-01 DIAGNOSIS — R945 Abnormal results of liver function studies: Secondary | ICD-10-CM | POA: Diagnosis not present

## 2016-06-01 DIAGNOSIS — F10129 Alcohol abuse with intoxication, unspecified: Secondary | ICD-10-CM | POA: Diagnosis not present

## 2016-06-01 DIAGNOSIS — R7989 Other specified abnormal findings of blood chemistry: Secondary | ICD-10-CM | POA: Diagnosis not present

## 2016-06-01 DIAGNOSIS — F10121 Alcohol abuse with intoxication delirium: Secondary | ICD-10-CM | POA: Diagnosis not present

## 2016-06-01 DIAGNOSIS — J329 Chronic sinusitis, unspecified: Secondary | ICD-10-CM | POA: Diagnosis not present

## 2016-06-01 DIAGNOSIS — F17200 Nicotine dependence, unspecified, uncomplicated: Secondary | ICD-10-CM | POA: Diagnosis not present

## 2016-06-01 DIAGNOSIS — I440 Atrioventricular block, first degree: Secondary | ICD-10-CM | POA: Diagnosis not present

## 2016-06-01 DIAGNOSIS — T148XXA Other injury of unspecified body region, initial encounter: Secondary | ICD-10-CM | POA: Diagnosis not present

## 2016-06-01 DIAGNOSIS — S199XXA Unspecified injury of neck, initial encounter: Secondary | ICD-10-CM | POA: Diagnosis not present

## 2016-06-03 DIAGNOSIS — S40012A Contusion of left shoulder, initial encounter: Secondary | ICD-10-CM | POA: Diagnosis not present

## 2016-06-03 DIAGNOSIS — S139XXA Sprain of joints and ligaments of unspecified parts of neck, initial encounter: Secondary | ICD-10-CM | POA: Diagnosis not present

## 2016-06-12 ENCOUNTER — Encounter: Payer: Self-pay | Admitting: Physical Therapy

## 2016-06-12 ENCOUNTER — Ambulatory Visit: Payer: 59 | Attending: Sports Medicine | Admitting: Physical Therapy

## 2016-06-12 DIAGNOSIS — M542 Cervicalgia: Secondary | ICD-10-CM | POA: Insufficient documentation

## 2016-06-12 DIAGNOSIS — R252 Cramp and spasm: Secondary | ICD-10-CM | POA: Insufficient documentation
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
## 2016-06-16 ENCOUNTER — Encounter: Payer: Self-pay | Admitting: Physical Therapy

## 2016-06-16 ENCOUNTER — Ambulatory Visit: Payer: 59 | Admitting: Physical Therapy

## 2016-06-16 DIAGNOSIS — M542 Cervicalgia: Secondary | ICD-10-CM

## 2016-06-16 DIAGNOSIS — R252 Cramp and spasm: Secondary | ICD-10-CM

## 2016-06-16 NOTE — Therapy (Signed)
[place] [address] [place], [place], [address] Phone: [phone]   Fax:  [phone]  Physical Therapy Treatment  Patient Details  Name: Angela Stark MRN: [id number] Date of Birth: 12/10/1970 Referring Provider: Alfonso Ramus  Encounter Date: 06/16/2016      PT End of Session - 06/16/16 1610    Visit Number 2   Date for PT Re-Evaluation 08/12/16   PT Start Time 1031   PT Stop Time 1625   PT Time Calculation (min) 50 min      Past Medical History:  Diagnosis Date  . Anxiety   . Depression   . GERD (gastroesophageal reflux disease)   . Heart murmur    When pregnant  . Hyperlipidemia   . Hypertension   . Phlebitis   . Sleep apnea     Past Surgical History:  Procedure Laterality Date  . OVARY SURGERY      There were no vitals filed for this visit.      Subjective Assessment - 06/16/16 1539    Subjective looking up is bad, woke up with RT arm numbness this morning. Doing stretches at home.   Currently in Pain? Yes   Pain Score 5    Pain Location Neck                         [facility] Adult PT Treatment/Exercise - 06/16/16 0001      Exercises   Exercises Neck;Shoulder     Neck Exercises: Machines for Strengthening   UBE (Upper Arm Bike) L 2 2 fwd/2 back     Neck Exercises: Theraband   Scapula Retraction 15 reps  yellow 3 way   Shoulder External Rotation 15 reps  yellow     Neck Exercises: Standing   Neck Retraction 15 reps  with ball- cuing as pt tends to hold head in RT rot   Wall Push Ups 15 reps  with ball     Moist Heat Therapy   Number Minutes Moist Heat 15 Minutes   Moist Heat Location Cervical     Manual Therapy   Manual Therapy Soft tissue mobilization;Myofascial release   Manual therapy comments bilateral cerv/UT and rhom very tight                  PT Short Term Goals - 06/12/16 0947      PT SHORT TERM GOAL #1   Title independent with  initial HEP   Time 2   Period Weeks   Status New           PT Long Term Goals - 06/12/16 0947      PT LONG TERM GOAL #1   Title decrease pain 50%   Time 8   Period Weeks   Status New     PT LONG TERM GOAL #2   Title increase cervical ROM to WNL's   Time 8   Period Weeks   Status New     PT LONG TERM GOAL #3   Title no issues backing up the car   Time 8   Period Weeks   Status New     PT LONG TERM GOAL #4   Title understand proper posture and body mechanics   Time 8   Period Weeks   Status New               Plan - 06/16/16 1611    Clinical Impression  Statement pt did not like estim so did STW and MH - pt is very tight. Tolerated ther ex well. Cuing needed for midline head control. Pt neg for neural tension in RT   PT Next Visit Plan assess lst session and progress. check numbness and STW to cerv/lumb      Patient will benefit from skilled therapeutic intervention in order to improve the following deficits and impairments:  Decreased range of motion, Decreased strength, Increased muscle spasms, Postural dysfunction, Improper body mechanics, Pain  Visit Diagnosis: Cervicalgia  Cramp and spasm     Problem List Patient Active Problem List   Diagnosis Date Noted  . Patellofemoral arthritis of left knee 09/05/2015  . Right ankle pain 09/05/2015  . Acquired supination of foot 09/05/2015  . Essential hypertension 07/30/2015  . Visual disturbance 07/30/2015  . Right upper quadrant pain 07/30/2015  . Vision disturbance 04/16/2015  . Tobacco use disorder 04/16/2015  . Routine general medical examination at a health care facility 11/30/2014  . Encounter for screening mammogram for breast cancer 08/06/2014  . Hypothyroidism 08/06/2014  . Encounter for smoking cessation counseling 08/06/2014  . Delirium tremens ([place]) 08/19/2011  . Alcohol abuse 08/19/2011  . Alcoholic hepatitis 76/22/6333  . Dehydration 08/18/2011  . ARF (acute renal failure) ([place])  08/18/2011  . Hypokalemia 08/18/2011  . Hyponatremia 08/18/2011  . Thrombocytopenia ([place]) 08/18/2011  . Polysubstance dependence including opioid type drug, episodic abuse ([place]) 08/18/2011  . Anxiety and depression 07/26/2006    PAYSEUR,ANGIE PTA 06/16/2016, 4:13 PM  [address] [place], [place], [address] Phone: [phone]   Fax:  [phone]  Name: TAKYRA CANTRALL MRN: [id number] Date of Birth: 02/24/1971

## 2016-06-23 ENCOUNTER — Encounter: Payer: Self-pay | Admitting: Physical Therapy

## 2016-06-23 ENCOUNTER — Ambulatory Visit: Payer: 59 | Attending: Sports Medicine | Admitting: Physical Therapy

## 2016-06-23 DIAGNOSIS — R252 Cramp and spasm: Secondary | ICD-10-CM | POA: Diagnosis not present

## 2016-06-23 DIAGNOSIS — M542 Cervicalgia: Secondary | ICD-10-CM | POA: Diagnosis not present

## 2016-06-23 NOTE — Therapy (Addendum)
[facility] [address] [place], [place], [address] Phone: [phone]   Fax:  [phone]  Physical Therapy Treatment  Patient Details  Name: Angela Stark MRN: [id number] Date of Birth: [birth year] Referring Provider: Alfonso Ramus  Encounter Date: 06/23/2016      PT End of Session - 06/23/16 1558    Visit Number 3   Date for PT Re-Evaluation 08/12/16   PT Start Time 7494   PT Stop Time 1610   PT Time Calculation (min) 55 min   Activity Tolerance Patient tolerated treatment well   Behavior During Therapy [facility] for tasks assessed/performed      Past Medical History:  Diagnosis Date  . Anxiety   . Depression   . GERD (gastroesophageal reflux disease)   . Heart murmur    When pregnant  . Hyperlipidemia   . Hypertension   . Phlebitis   . Sleep apnea     Past Surgical History:  Procedure Laterality Date  . OVARY SURGERY      There were no vitals filed for this visit.      Subjective Assessment - 06/23/16 1517    Subjective Pt reports pain when looking up and rotating head to the R.   Currently in Pain? Yes   Pain Score 4    Pain Location Neck                         [facility] Adult PT Treatment/Exercise - 06/23/16 0001      Neck Exercises: Machines for Strengthening   UBE (Upper Arm Bike) L 2 3 fwd/3 back   Cybex Row 25lb 2x10   Other Machines for Strengthening lat 25lb 2x10     Neck Exercises: Standing   Wall Push Ups 15 reps  x2   Other Standing Exercises shoulder er yellow tband 2x15   Other Standing Exercises Shoulder ext red theraband 2x10  standing rows blue theraband 2x10     Neck Exercises: Supine   Neck Retraction 10 reps;3 secs  x2, 3 way     Shoulder Exercises: Standing   Flexion Both;Weights;20 reps   Shoulder Flexion Weight (lbs) 2   ABduction Both;20 reps;Weights   Shoulder ABduction Weight (lbs) 2     Moist Heat Therapy   Number Minutes Moist Heat 15  Minutes   Moist Heat Location Cervical     Electrical Stimulation   Electrical Stimulation Location C/T area   Electrical Stimulation Action IFC   Electrical Stimulation Parameters supine   Electrical Stimulation Goals Pain     Manual Therapy   Manual Therapy Soft tissue mobilization;Passive ROM;Muscle Energy Technique;Manual Traction   Manual therapy comments bilateral cerv/UT and rhom very tight   Soft tissue mobilization posterior paraspinales   Passive ROM cervical spine   Manual Traction 3 x10 sec cervical spine   Muscle Energy Technique contract relax for cervial rotation                  PT Short Term Goals - 06/12/16 0947      PT SHORT TERM GOAL #1   Title independent with initial HEP   Time 2   Period Weeks   Status New           PT Long Term Goals - 06/23/16 1559      PT LONG TERM GOAL #1   Title decrease pain 50%   Status On-going     PT LONG  TERM GOAL #2   Title increase cervical ROM to WNL's   Status On-going     PT LONG TERM GOAL #3   Title no issues backing up the car   Status On-going     PT LONG TERM GOAL #4   Title understand proper posture and body mechanics   Status On-going               Plan - 06/23/16 1558    Clinical Impression Statement pt reports no functional limitation at home. does report a twinge of pain when she look up and rotates her head to the right. Demo ed hood strength and ROM with all exercises.    Rehab Potential Good   PT Frequency 2x / week   PT Duration 8 weeks   PT Treatment/Interventions ADLs/Self Care Home Management;Electrical Stimulation;Moist Heat;Ultrasound   PT Next Visit Plan assess last session and progress      Patient will benefit from skilled therapeutic intervention in order to improve the following deficits and impairments:  Decreased range of motion, Decreased strength, Increased muscle spasms, Postural dysfunction, Improper body mechanics, Pain  Visit  Diagnosis: Cervicalgia  Cramp and spasm     Problem List Patient Active Problem List   Diagnosis Date Noted  . Patellofemoral arthritis of left knee 09/05/2015  . Right ankle pain 09/05/2015  . Acquired supination of foot 09/05/2015  . Essential hypertension 07/30/2015  . Visual disturbance 07/30/2015  . Right upper quadrant pain 07/30/2015  . Vision disturbance 04/16/2015  . Tobacco use disorder 04/16/2015  . Routine general medical examination at a health care facility 11/30/2014  . Encounter for screening mammogram for breast cancer 08/06/2014  . Hypothyroidism 08/06/2014  . Encounter for smoking cessation counseling 08/06/2014  . Delirium tremens ([place]) 08/19/2011  . Alcohol abuse 08/19/2011  . Alcoholic hepatitis 88/33/7445  . Dehydration 08/18/2011  . ARF (acute renal failure) ([place]) 08/18/2011  . Hypokalemia 08/18/2011  . Hyponatremia 08/18/2011  . Thrombocytopenia ([place]) 08/18/2011  . Polysubstance dependence including opioid type drug, episodic abuse ([place]) 08/18/2011  . Anxiety and depression 07/26/2006   PHYSICAL THERAPY DISCHARGE SUMMARY  Visits from Start of Care: 3  Plan: Patient agrees to discharge.  Patient goals were not met. Patient is being discharged due to not returning since the last visit.  ?????       Scot Jun, PTA 06/23/2016, 4:01 PM  [place] [address] [place], [place], [address] Phone: [phone]   Fax:  [phone]  Name: Angela Stark MRN: [id number] Date of Birth: Nov 12, 1970

## 2016-06-25 ENCOUNTER — Ambulatory Visit: Payer: 59 | Admitting: Physical Therapy

## 2016-08-24 MED FILL — BUPROPION HCL XL 150 MG TAB: 150 | 90 days supply | Qty: 90 | Fill #0

## 2016-08-27 ENCOUNTER — Encounter: Payer: Self-pay | Admitting: Family

## 2016-08-27 ENCOUNTER — Other Ambulatory Visit (INDEPENDENT_AMBULATORY_CARE_PROVIDER_SITE_OTHER): Payer: 59

## 2016-08-27 ENCOUNTER — Ambulatory Visit (INDEPENDENT_AMBULATORY_CARE_PROVIDER_SITE_OTHER): Payer: 59 | Admitting: Family

## 2016-08-27 VITALS — BP 120/70 | HR 72 | Temp 98.4°F | Resp 16 | Ht 66.5 in | Wt 221.0 lb

## 2016-08-27 DIAGNOSIS — Z0000 Encounter for general adult medical examination without abnormal findings: Secondary | ICD-10-CM | POA: Diagnosis not present

## 2016-08-27 DIAGNOSIS — F17200 Nicotine dependence, unspecified, uncomplicated: Secondary | ICD-10-CM | POA: Diagnosis not present

## 2016-08-27 DIAGNOSIS — Z6835 Body mass index (BMI) 35.0-35.9, adult: Secondary | ICD-10-CM

## 2016-08-27 DIAGNOSIS — Z6841 Body Mass Index (BMI) 40.0 and over, adult: Secondary | ICD-10-CM

## 2016-08-27 DIAGNOSIS — E66813 Obesity, class 3: Secondary | ICD-10-CM

## 2016-08-27 HISTORY — DX: Obesity, class 3: E66.813

## 2016-08-27 LAB — CBC
HEMATOCRIT: 41.6 % (ref 36.0–46.0)
HEMOGLOBIN: 14.1 g/dL (ref 12.0–15.0)
MCHC: 33.9 g/dL (ref 30.0–36.0)
MCV: 94.4 fl (ref 78.0–100.0)
Platelets: 256 10*3/uL (ref 150.0–400.0)
RBC: 4.4 Mil/uL (ref 3.87–5.11)
RDW: 14.9 % (ref 11.5–15.5)
WBC: 9 10*3/uL (ref 4.0–10.5)

## 2016-08-27 LAB — COMPREHENSIVE METABOLIC PANEL
ALT: 14 U/L (ref 0–35)
AST: 16 U/L (ref 0–37)
Albumin: 4.4 g/dL (ref 3.5–5.2)
Alkaline Phosphatase: 33 U/L — ABNORMAL LOW (ref 39–117)
BUN: 13 mg/dL (ref 6–23)
CHLORIDE: 100 meq/L (ref 96–112)
CO2: 30 mEq/L (ref 19–32)
Calcium: 10.1 mg/dL (ref 8.4–10.5)
Creatinine, Ser: 0.66 mg/dL (ref 0.40–1.20)
GFR: 102.52 mL/min (ref >60.00)
GLUCOSE: 95 mg/dL (ref 70–99)
POTASSIUM: 3.8 meq/L (ref 3.5–5.1)
Sodium: 138 mEq/L (ref 135–145)
TOTAL PROTEIN: 7.2 g/dL (ref 6.0–8.3)
Total Bilirubin: 0.6 mg/dL (ref 0.2–1.2)

## 2016-08-27 LAB — LIPID PANEL
Cholesterol: 225 mg/dL — ABNORMAL HIGH (ref 0–200)
HDL: 39.4 mg/dL (ref >39.00)
LDL Cholesterol: 156 mg/dL — ABNORMAL HIGH (ref 0–99)
NONHDL: 185.99
Total CHOL/HDL Ratio: 6
Triglycerides: 152 mg/dL — ABNORMAL HIGH (ref 0.0–149.0)
VLDL: 30.4 mg/dL (ref 0.0–40.0)

## 2016-08-27 NOTE — Progress Notes (Signed)
Subjective:    Patient ID: Angela Bosworthatherine W Stark, female    DOB: 1970-09-03, 46 y.o.   MRN: [id number]  Chief Complaint  Patient presents with  . CPE    fasting    HPI:  Angela Stark is a 46 y.o. female who presents today for an annual wellness visit.   1) Health Maintenance -   Diet - Averages about 3 meals per day consisting of a regular diet; Working on losing weight; Caffeine intake of 3-4 cups per day   Exercise - Walks and lifts work; no structured exercise.    2) Preventative Exams / Immunizations:  Dental -- Due for exam   Vision -- Up to date   Health Maintenance  Topic Date Due  . HIV Screening  09/29/1985  . INFLUENZA VACCINE  10/21/2016  . PAP SMEAR  08/26/2017  . TETANUS/TDAP  11/29/2024    Immunization History  Administered Date(s) Administered  . Influenza,inj,Quad PF,36+ Mos 11/30/2014, 12/09/2015  . PPD Test 04/16/2015, 12/09/2015  . Pneumococcal Polysaccharide-23 08/19/2011  . Td 11/30/2014     No Known Allergies   Outpatient Medications Prior to Visit  Medication Sig Dispense Refill  . amLODipine (NORVASC) 10 MG tablet Take 1 tablet (10 mg total) by mouth daily. 90 tablet 1  . buPROPion (WELLBUTRIN XL) 150 MG 24 hr tablet Take 1 tablet (150 mg total) by mouth daily. 180 tablet 1  . levothyroxine (SYNTHROID, LEVOTHROID) 25 MCG tablet take 1 tablet by mouth once daily BEFORE BREAKFAST 90 tablet 1  . lisinopril-hydrochlorothiazide (ZESTORETIC) 20-12.5 MG tablet Take 1 tablet by mouth daily. 90 tablet 1  . Multiple Vitamin (MULITIVITAMIN WITH MINERALS) TABS Take 1 tablet by mouth daily.    . folic acid (FOLVITE) 1 MG tablet Take 1 tablet (1 mg total) by mouth daily. 30 tablet 1  . potassium chloride SA (K-DUR,KLOR-CON) 20 MEQ tablet Take 1 tablet (20 mEq total) by mouth daily. 90 tablet 0   No facility-administered medications prior to visit.      Past Medical History:  Diagnosis Date  . Anxiety   . Depression   . GERD  (gastroesophageal reflux disease)   . Heart murmur    When pregnant  . Hyperlipidemia   . Hypertension   . Phlebitis   . Sleep apnea      Past Surgical History:  Procedure Laterality Date  . OVARY SURGERY       Family History  Problem Relation Age of Onset  . Hypertension Mother   . Hyperlipidemia Mother   . Diabetes Mother   . Emphysema Mother   . Heart disease Father   . Hypertension Father   . Stroke Brother   . Lung cancer Maternal Grandmother      Social History   Social History  . Marital status: Married    Spouse name: N/A  . Number of children: 3  . Years of education: 14   Occupational History  . CNA    Social History Main Topics  . Smoking status: Current Every Day Smoker    Packs/day: 0.50    Years: 20.00    Types: Cigarettes  . Smokeless tobacco: Never Used  . Alcohol use No  . Drug use: No  . Sexual activity: Yes    Birth control/ protection: None   Other Topics Concern  . Not on file   Social History Narrative   Fun: Fishing, camping   Denies religious beliefs effecting health care.  Review of Systems  Constitutional: Denies fever, chills, fatigue, or significant weight gain/loss. HENT: Head: Denies headache or neck pain Ears: Denies changes in hearing, ringing in ears, earache, drainage Nose: Denies discharge, stuffiness, itching, nosebleed, sinus pain Throat: Denies sore throat, hoarseness, dry mouth, sores, thrush Eyes: Denies loss/changes in vision, pain, redness, blurry/double vision, flashing lights Cardiovascular: Denies chest pain/discomfort, tightness, palpitations, shortness of breath with activity, difficulty lying down, swelling, sudden awakening with shortness of breath Respiratory: Denies shortness of breath, cough, sputum production, wheezing Gastrointestinal: Denies dysphasia, heartburn, change in appetite, nausea, change in bowel habits, rectal bleeding, constipation, diarrhea, yellow skin or  eyes Genitourinary: Denies frequency, urgency, burning/pain, blood in urine, incontinence, change in urinary strength. Musculoskeletal: Denies muscle/joint pain, stiffness, back pain, redness or swelling of joints, trauma Skin: Denies rashes, lumps, itching, dryness, color changes, or hair/nail changes Neurological: Denies dizziness, fainting, seizures, weakness, numbness, tingling, tremor Psychiatric - Denies nervousness, stress, depression or memory loss Endocrine: Denies heat or cold intolerance, sweating, frequent urination, excessive thirst, changes in appetite Hematologic: Denies ease of bruising or bleeding     Objective:    BP 120/70 (BP Location: Left Arm, Patient Position: Sitting, Cuff Size: Large)   Pulse 72   Temp 98.4 F (36.9 C) (Oral)   Resp 16   Ht 5' 6.5" (1.689 m)   Wt 221 lb (100.2 kg)   SpO2 97%   BMI 35.14 kg/m  Nursing note and vital signs reviewed.   Physical Exam  Constitutional: She is oriented to person, place, and time. She appears well-developed and well-nourished.  HENT:  Head: Normocephalic.  Right Ear: Hearing, tympanic membrane, external ear and ear canal normal.  Left Ear: Hearing, tympanic membrane, external ear and ear canal normal.  Nose: Nose normal.  Mouth/Throat: Uvula is midline, oropharynx is clear and moist and mucous membranes are normal.  Eyes: Conjunctivae and EOM are normal. Pupils are equal, round, and reactive to light.  Neck: Neck supple. No JVD present. No tracheal deviation present. No thyromegaly present.  Cardiovascular: Normal rate, regular rhythm, normal heart sounds and intact distal pulses.   Pulmonary/Chest: Effort normal and breath sounds normal.  Abdominal: Soft. Bowel sounds are normal. She exhibits no distension and no mass. There is no tenderness. There is no rebound and no guarding.  Musculoskeletal: Normal range of motion. She exhibits no edema or tenderness.  Lymphadenopathy:    She has no cervical adenopathy.   Neurological: She is alert and oriented to person, place, and time. She has normal reflexes. No cranial nerve deficit. She exhibits normal muscle tone. Coordination normal.  Skin: Skin is warm and dry.  Psychiatric: She has a normal mood and affect. Her behavior is normal. Judgment and thought content normal.        Assessment & Plan:   Problem List Items Addressed This Visit      Other   Routine general medical examination at a health care facility - Primary    1) Anticipatory Guidance: Discussed importance of wearing a seatbelt while driving and not texting while driving; changing batteries in smoke detector at least once annually; wearing suntan lotion when outside; eating a balanced and moderate diet; getting physical activity at least 30 minutes per day.  2) Immunizations / Screenings / Labs:  All immunizations are up-to-date per recommendations. Due for a dental exam encouraged to be completed independently. Cervical cancer screening is up to date per recommendations. All other screenings are up to date per recommendations. Obtain CBC, CMET, and  lipid profile.    Overall well exam with risk factors for cardiovascular disease including hypertension, tobacco use and obesity. Recommend weight loss of 5-10% of current body weight through continued nutritional and physical activity changes. Goal is to continue to work on decreasing tobacco cessation. Blood pressure is well controlled. 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 Continue other healthy lifestyle behaviors and choices. Follow up prevention exam in 1 year. Follow up office visit pending blood work and for chronic conditions.        Relevant Orders   CBC (Completed)   Comprehensive metabolic panel (Completed)   Lipid panel (Completed)   Tobacco use disorder    Continues to smoke 1/2 pack per day totaling about 10 pack year history and maintained on buproprion with inability to quit smoking. Wishing to consider Chantix.  In the pre-contemplation stage of change. Emphasized importance of tobacco cessation to reduce risk for cardiovascular, respiratory and malignant disease in the future.       Class 2 severe obesity due to excess calories with serious comorbidity and body mass index (BMI) of 35.0 to 35.9 in adult (HCC)    BMI of 35. Recommend weight loss of 5-10% of current body weight. Recommend increasing physical activity to 30 minutes of moderate level activity daily. Encourage nutritional intake that focuses on nutrient dense foods and is moderate, varied, and balanced and is low in saturated fats and processed/sugary foods. Continue to monitor.            I have discontinued Ms. Cillo folic acid and potassium chloride SA. I am also having her maintain her multivitamin with minerals, amLODipine, buPROPion, levothyroxine, and lisinopril-hydrochlorothiazide.   Follow-up: Return in about 3 months (around 11/27/2016), or if symptoms worsen or fail to improve.   Jeanine Luz, FNP

## 2016-08-27 NOTE — Assessment & Plan Note (Signed)
Continues to smoke 1/2 pack per day totaling about 10 pack year history and maintained on buproprion with inability to quit smoking. Wishing to consider Chantix. In the pre-contemplation stage of change. Emphasized importance of tobacco cessation to reduce risk for cardiovascular, respiratory and malignant disease in the future.

## 2016-08-27 NOTE — Assessment & Plan Note (Signed)
BMI of 35. Recommend weight loss of 5-10% of current body weight. Recommend increasing physical activity to 30 minutes of moderate level activity daily. Encourage nutritional intake that focuses on nutrient dense foods and is moderate, varied, and balanced and is low in saturated fats and processed/sugary foods. Continue to monitor.   

## 2016-08-27 NOTE — Patient Instructions (Addendum)
Thank you for choosing [facility].  SUMMARY AND INSTRUCTIONS:  Continue to take your medication as prescribed.   Ice / moist heat x 20 minutes every 2 hours and as needed for your neck.  Ice your knee after work and activity.  We will check you blood work today.  Keep up the good work as you are losing weight.   Labs:  Please stop by the lab on the lower level of the building for your blood work. Your results will be released to [place] (or called to you) after review, usually within 72 hours after test completion. If any changes need to be made, you will be notified at that same time.  1.) The lab is open from 7:30am to 5:30 pm Monday-Friday 2.) No appointment is necessary 3.) Fasting (if needed) is 6-8 hours after food and drink; black coffee and water are okay   Follow up:  If your symptoms worsen or fail to improve, please contact our office for further instruction, or in case of emergency go directly to the emergency room at the closest medical facility.   Health Maintenance, Female Adopting a healthy lifestyle and getting preventive care can go a long way to promote health and wellness. Talk with your health care provider about what schedule of regular examinations is right for you. This is a good chance for you to check in with your provider about disease prevention and staying healthy. In between checkups, there are plenty of things you can do on your own. Experts have done a lot of research about which lifestyle changes and preventive measures are most likely to keep you healthy. Ask your health care provider for more information. Weight and diet Eat a healthy diet  Be sure to include plenty of vegetables, fruits, low-fat dairy products, and lean protein.  Do not eat a lot of foods high in solid fats, added sugars, or salt.  Get regular exercise. This is one of the most important things you can do for your health. ? Most adults should exercise for at least 150  minutes each week. The exercise should increase your heart rate and make you sweat (moderate-intensity exercise). ? Most adults should also do strengthening exercises at least twice a week. This is in addition to the moderate-intensity exercise.  Maintain a healthy weight  Body mass index (BMI) is a measurement that can be used to identify possible weight problems. It estimates body fat based on height and weight. Your health care provider can help determine your BMI and help you achieve or maintain a healthy weight.  For females 87 years of age and older: ? A BMI below 18.5 is considered underweight. ? A BMI of 18.5 to 24.9 is normal. ? A BMI of 25 to 29.9 is considered overweight. ? A BMI of 30 and above is considered obese.  Watch levels of cholesterol and blood lipids  You should start having your blood tested for lipids and cholesterol at 46 years of age, then have this test every 5 years.  You may need to have your cholesterol levels checked more often if: ? Your lipid or cholesterol levels are high. ? You are older than 46 years of age. ? You are at high risk for heart disease.  Cancer screening Lung Cancer  Lung cancer screening is recommended for adults 83-57 years old who are at high risk for lung cancer because of a history of smoking.  A yearly low-dose CT scan of the lungs is recommended for  people who: ? Currently smoke. ? Have quit within the past 15 years. ? Have at least a 30-pack-year history of smoking. A pack year is smoking an average of one pack of cigarettes a day for 1 year.  Yearly screening should continue until it has been 15 years since you quit.  Yearly screening should stop if you develop a health problem that would prevent you from having lung cancer treatment.  Breast Cancer  Practice breast self-awareness. This means understanding how your breasts normally appear and feel.  It also means doing regular breast self-exams. Let your health care  provider know about any changes, no matter how small.  If you are in your 20s or 30s, you should have a clinical breast exam (CBE) by a health care provider every 1-3 years as part of a regular health exam.  If you are 3 or older, have a CBE every year. Also consider having a breast X-ray (mammogram) every year.  If you have a family history of breast cancer, talk to your health care provider about genetic screening.  If you are at high risk for breast cancer, talk to your health care provider about having an MRI and a mammogram every year.  Breast cancer gene (BRCA) assessment is recommended for women who have family members with BRCA-related cancers. BRCA-related cancers include: ? Breast. ? Ovarian. ? Tubal. ? Peritoneal cancers.  Results of the assessment will determine the need for genetic counseling and BRCA1 and BRCA2 testing.  Cervical Cancer Your health care provider may recommend that you be screened regularly for cancer of the pelvic organs (ovaries, uterus, and vagina). This screening involves a pelvic examination, including checking for microscopic changes to the surface of your cervix (Pap test). You may be encouraged to have this screening done every 3 years, beginning at age 68.  For women ages 19-65, health care providers may recommend pelvic exams and Pap testing every 3 years, or they may recommend the Pap and pelvic exam, combined with testing for human papilloma virus (HPV), every 5 years. Some types of HPV increase your risk of cervical cancer. Testing for HPV may also be done on women of any age with unclear Pap test results.  Other health care providers may not recommend any screening for nonpregnant women who are considered low risk for pelvic cancer and who do not have symptoms. Ask your health care provider if a screening pelvic exam is right for you.  If you have had past treatment for cervical cancer or a condition that could lead to cancer, you need Pap tests  and screening for cancer for at least 20 years after your treatment. If Pap tests have been discontinued, your risk factors (such as having a new sexual partner) need to be reassessed to determine if screening should resume. Some women have medical problems that increase the chance of getting cervical cancer. In these cases, your health care provider may recommend more frequent screening and Pap tests.  Colorectal Cancer  This type of cancer can be detected and often prevented.  Routine colorectal cancer screening usually begins at 46 years of age and continues through 46 years of age.  Your health care provider may recommend screening at an earlier age if you have risk factors for colon cancer.  Your health care provider may also recommend using home test kits to check for hidden blood in the stool.  A small camera at the end of a tube can be used to examine your colon directly (  sigmoidoscopy or colonoscopy). This is done to check for the earliest forms of colorectal cancer.  Routine screening usually begins at age 42.  Direct examination of the colon should be repeated every 5-10 years through 46 years of age. However, you may need to be screened more often if early forms of precancerous polyps or small growths are found.  Skin Cancer  Check your skin from head to toe regularly.  Tell your health care provider about any new moles or changes in moles, especially if there is a change in a mole's shape or color.  Also tell your health care provider if you have a mole that is larger than the size of a pencil eraser.  Always use sunscreen. Apply sunscreen liberally and repeatedly throughout the day.  Protect yourself by wearing long sleeves, pants, a wide-brimmed hat, and sunglasses whenever you are outside.  Heart disease, diabetes, and high blood pressure  High blood pressure causes heart disease and increases the risk of stroke. High blood pressure is more likely to develop  in: ? People who have blood pressure in the high end of the normal range (130-139/85-89 mm Hg). ? People who are overweight or obese. ? People who are African American.  If you are 55-58 years of age, have your blood pressure checked every 3-5 years. If you are 3 years of age or older, have your blood pressure checked every year. You should have your blood pressure measured twice-once when you are at a hospital or clinic, and once when you are not at a hospital or clinic. Record the average of the two measurements. To check your blood pressure when you are not at a hospital or clinic, you can use: ? An automated blood pressure machine at a pharmacy. ? A home blood pressure monitor.  If you are between 18 years and 73 years old, ask your health care provider if you should take aspirin to prevent strokes.  Have regular diabetes screenings. This involves taking a blood sample to check your fasting blood sugar level. ? If you are at a normal weight and have a low risk for diabetes, have this test once every three years after 46 years of age. ? If you are overweight and have a high risk for diabetes, consider being tested at a younger age or more often. Preventing infection Hepatitis B  If you have a higher risk for hepatitis B, you should be screened for this virus. You are considered at high risk for hepatitis B if: ? You were born in a country where hepatitis B is common. Ask your health care provider which countries are considered high risk. ? Your parents were born in a high-risk country, and you have not been immunized against hepatitis B (hepatitis B vaccine). ? You have HIV or AIDS. ? You use needles to inject street drugs. ? You live with someone who has hepatitis B. ? You have had sex with someone who has hepatitis B. ? You get hemodialysis treatment. ? You take certain medicines for conditions, including cancer, organ transplantation, and autoimmune conditions.  Hepatitis C  Blood  testing is recommended for: ? Everyone born from [birth year] through 1965. ? Anyone with known risk factors for hepatitis C.  Sexually transmitted infections (STIs)  You should be screened for sexually transmitted infections (STIs) including gonorrhea and chlamydia if: ? You are sexually active and are younger than 46 years of age. ? You are older than 46 years of age and your health care provider  tells you that you are at risk for this type of infection. ? Your sexual activity has changed since you were last screened and you are at an increased risk for chlamydia or gonorrhea. Ask your health care provider if you are at risk.  If you do not have HIV, but are at risk, it may be recommended that you take a prescription medicine daily to prevent HIV infection. This is called pre-exposure prophylaxis (PrEP). You are considered at risk if: ? You are sexually active and do not regularly use condoms or know the HIV status of your partner(s). ? You take drugs by injection. ? You are sexually active with a partner who has HIV.  Talk with your health care provider about whether you are at high risk of being infected with HIV. If you choose to begin PrEP, you should first be tested for HIV. You should then be tested every 3 months for as long as you are taking PrEP. Pregnancy  If you are premenopausal and you may become pregnant, ask your health care provider about preconception counseling.  If you may become pregnant, take 400 to 800 micrograms (mcg) of folic acid every day.  If you want to prevent pregnancy, talk to your health care provider about birth control (contraception). Osteoporosis and menopause  Osteoporosis is a disease in which the bones lose minerals and strength with aging. This can result in serious bone fractures. Your risk for osteoporosis can be identified using a bone density scan.  If you are 74 years of age or older, or if you are at risk for osteoporosis and fractures, ask your  health care provider if you should be screened.  Ask your health care provider whether you should take a calcium or vitamin D supplement to lower your risk for osteoporosis.  Menopause may have certain physical symptoms and risks.  Hormone replacement therapy may reduce some of these symptoms and risks. Talk to your health care provider about whether hormone replacement therapy is right for you. Follow these instructions at home:  Schedule regular health, dental, and eye exams.  Stay current with your immunizations.  Do not use any tobacco products including cigarettes, chewing tobacco, or electronic cigarettes.  If you are pregnant, do not drink alcohol.  If you are breastfeeding, limit how much and how often you drink alcohol.  Limit alcohol intake to no more than 1 drink per day for nonpregnant women. One drink equals 12 ounces of beer, 5 ounces of wine, or 1 ounces of hard liquor.  Do not use street drugs.  Do not share needles.  Ask your health care provider for help if you need support or information about quitting drugs.  Tell your health care provider if you often feel depressed.  Tell your health care provider if you have ever been abused or do not feel safe at home. This information is not intended to replace advice given to you by your health care provider. Make sure you discuss any questions you have with your health care provider. Document Released: 09/22/2010 Document Revised: 08/15/2015 Document Reviewed: 12/11/2014 Elsevier Interactive Patient Education  [employer].

## 2016-08-27 NOTE — Assessment & Plan Note (Signed)
1) Anticipatory Guidance: Discussed importance of wearing a seatbelt while driving and not texting while driving; changing batteries in smoke detector at least once annually; wearing suntan lotion when outside; eating a balanced and moderate diet; getting physical activity at least 30 minutes per day.  2) Immunizations / Screenings / Labs:  All immunizations are up-to-date per recommendations. Due for a dental exam encouraged to be completed independently. Cervical cancer screening is up to date per recommendations. All other screenings are up to date per recommendations. Obtain CBC, CMET, and lipid profile.    Overall well exam with risk factors for cardiovascular disease including hypertension, tobacco use and obesity. Recommend weight loss of 5-10% of current body weight through continued nutritional and physical activity changes. Goal is to continue to work on decreasing tobacco cessation. Blood pressure is well controlled. 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 Continue other healthy lifestyle behaviors and choices. Follow up prevention exam in 1 year. Follow up office visit pending blood work and for chronic conditions.

## 2016-11-03 ENCOUNTER — Telehealth: Payer: Self-pay | Admitting: Family

## 2016-11-03 MED ORDER — BUPROPION HCL ER (XL) 150 MG PO TB24: 150.0000 mg | ORAL_TABLET | Freq: Every day | ORAL | 1 refills | Status: DC

## 2016-11-03 MED ORDER — AMLODIPINE BESYLATE 10 MG PO TABS: 10.0000 mg | ORAL_TABLET | Freq: Every day | ORAL | 1 refills | Status: DC

## 2016-11-03 MED FILL — AMLODIPINE BESYLATE 10 MG T: 10 | 90 days supply | Qty: 90 | Fill #0

## 2016-11-03 NOTE — Telephone Encounter (Signed)
Reviewed chart pt is up-to-date sent refills to [facility]...Raechel Chute/lmb

## 2016-11-03 NOTE — Telephone Encounter (Signed)
Pt called requesting a [facility]

## 2017-05-28 ENCOUNTER — Telehealth: Payer: Self-pay | Admitting: Family Medicine

## 2017-05-28 ENCOUNTER — Encounter: Payer: Self-pay | Admitting: Family Medicine

## 2017-05-28 ENCOUNTER — Ambulatory Visit (INDEPENDENT_AMBULATORY_CARE_PROVIDER_SITE_OTHER): Payer: No Typology Code available for payment source | Admitting: Family Medicine

## 2017-05-28 VITALS — BP 144/90 | HR 80 | Ht 66.5 in | Wt 254.5 lb

## 2017-05-28 DIAGNOSIS — Z6841 Body Mass Index (BMI) 40.0 and over, adult: Secondary | ICD-10-CM | POA: Diagnosis not present

## 2017-05-28 DIAGNOSIS — N912 Amenorrhea, unspecified: Secondary | ICD-10-CM

## 2017-05-28 DIAGNOSIS — F419 Anxiety disorder, unspecified: Secondary | ICD-10-CM

## 2017-05-28 DIAGNOSIS — F17200 Nicotine dependence, unspecified, uncomplicated: Secondary | ICD-10-CM | POA: Diagnosis not present

## 2017-05-28 DIAGNOSIS — R0681 Apnea, not elsewhere classified: Secondary | ICD-10-CM | POA: Diagnosis not present

## 2017-05-28 DIAGNOSIS — F329 Major depressive disorder, single episode, unspecified: Secondary | ICD-10-CM | POA: Diagnosis not present

## 2017-05-28 DIAGNOSIS — F32A Depression, unspecified: Secondary | ICD-10-CM

## 2017-05-28 DIAGNOSIS — Z0000 Encounter for general adult medical examination without abnormal findings: Secondary | ICD-10-CM

## 2017-05-28 DIAGNOSIS — E66813 Obesity, class 3: Secondary | ICD-10-CM

## 2017-05-28 DIAGNOSIS — E039 Hypothyroidism, unspecified: Secondary | ICD-10-CM | POA: Diagnosis not present

## 2017-05-28 DIAGNOSIS — F1021 Alcohol dependence, in remission: Secondary | ICD-10-CM | POA: Diagnosis not present

## 2017-05-28 DIAGNOSIS — I10 Essential (primary) hypertension: Secondary | ICD-10-CM

## 2017-05-28 HISTORY — DX: Alcohol dependence, in remission: F10.21

## 2017-05-28 HISTORY — DX: Amenorrhea, unspecified: N91.2

## 2017-05-28 HISTORY — DX: Apnea, not elsewhere classified: R06.81

## 2017-05-28 LAB — COMPREHENSIVE METABOLIC PANEL
ALBUMIN: 4 g/dL (ref 3.5–5.2)
ALT: 13 U/L (ref 0–35)
AST: 17 U/L (ref 0–37)
Alkaline Phosphatase: 49 U/L (ref 39–117)
BUN: 14 mg/dL (ref 6–23)
CALCIUM: 9.2 mg/dL (ref 8.4–10.5)
CHLORIDE: 102 meq/L (ref 96–112)
CO2: 28 meq/L (ref 19–32)
CREATININE: 0.51 mg/dL (ref 0.40–1.20)
GFR: 137.59 mL/min (ref >60.00)
Glucose, Bld: 88 mg/dL (ref 70–99)
Potassium: 4.6 mEq/L (ref 3.5–5.1)
Sodium: 139 mEq/L (ref 135–145)
Total Bilirubin: 0.3 mg/dL (ref 0.2–1.2)
Total Protein: 6.5 g/dL (ref 6.0–8.3)

## 2017-05-28 LAB — TSH: TSH: 2.37 u[IU]/mL (ref 0.35–4.50)

## 2017-05-28 LAB — URINALYSIS, ROUTINE W REFLEX MICROSCOPIC
BILIRUBIN URINE: NEGATIVE
Hgb urine dipstick: NEGATIVE
KETONES UR: NEGATIVE
LEUKOCYTES UA: NEGATIVE
Nitrite: NEGATIVE
PH: 7 (ref 5.0–8.0)
RBC / HPF: NONE SEEN (ref 0–?)
SPECIFIC GRAVITY, URINE: 1.01 (ref 1.000–1.030)
TOTAL PROTEIN, URINE-UPE24: NEGATIVE
UROBILINOGEN UA: 0.2 (ref 0.0–1.0)
Urine Glucose: NEGATIVE

## 2017-05-28 LAB — LIPID PANEL
CHOL/HDL RATIO: 4
CHOLESTEROL: 193 mg/dL (ref 0–200)
HDL: 46.5 mg/dL (ref >39.00)
NONHDL: 146.06
TRIGLYCERIDES: 250 mg/dL — AB (ref 0.0–149.0)
VLDL: 50 mg/dL — AB (ref 0.0–40.0)

## 2017-05-28 LAB — CBC
HEMATOCRIT: 44.3 % (ref 36.0–46.0)
HEMOGLOBIN: 14.9 g/dL (ref 12.0–15.0)
MCHC: 33.6 g/dL (ref 30.0–36.0)
MCV: 95 fl (ref 78.0–100.0)
PLATELETS: 218 10*3/uL (ref 150.0–400.0)
RBC: 4.66 Mil/uL (ref 3.87–5.11)
RDW: 13.4 % (ref 11.5–15.5)
WBC: 5.3 10*3/uL (ref 4.0–10.5)

## 2017-05-28 LAB — LDL CHOLESTEROL, DIRECT: LDL DIRECT: 108 mg/dL

## 2017-05-28 LAB — FOLLICLE STIMULATING HORMONE: FSH: 19.3 m[IU]/mL

## 2017-05-28 MED ORDER — CHLORTHALIDONE 25 MG PO TABS: 25.0000 mg | ORAL_TABLET | Freq: Every day | ORAL | 0 refills | Status: DC

## 2017-05-28 MED ORDER — BUPROPION HCL ER (XL) 150 MG PO TB24: 150.0000 mg | ORAL_TABLET | Freq: Every day | ORAL | 0 refills | Status: DC

## 2017-05-28 MED ORDER — AMLODIPINE BESYLATE 10 MG PO TABS: 10.0000 mg | ORAL_TABLET | Freq: Every day | ORAL | 0 refills | Status: DC

## 2017-05-28 MED FILL — AMLODIPINE BESYLATE 10 MG T: 10 | 90 days supply | Qty: 90 | Fill #0

## 2017-05-28 MED FILL — buPROPion HCL ER (XL) 150 M: 150 | 90 days supply | Qty: 90 | Fill #0

## 2017-05-28 MED FILL — CHLORTHALIDONE 25 MG TAB: 25 | 90 days supply | Qty: 90 | Fill #0

## 2017-05-28 NOTE — Progress Notes (Addendum)
Subjective:  Patient ID: Angela Stark, female    DOB: May 11, 1970  Age: 47 y.o. MRN: [id number]  CC: Establish Care   HPI [name] presents for establishment of care and follow-up of multiple problems.  History of hypertension that is been treated with amlodipine and Zestoretic.  She did not tolerate the Zestoretic due to cough and self DC'd it.  She has been taking the Norvasc intermittently.  Past medical history of hypothyroidism.  She has been taking Synthroid but it was discontinued after her TSH normalized she tells me.  Past medical history anxiety and depression treated with Wellbutrin.  She has not taken this medicine in some time now.  She is smoking a half a pack of cigarettes is interested in quitting.  She is gaining a significant amount of weight.  She is 6 months into recovery [facility].  She has not had a menstrual cycle in 4 months.  She is wondering about menopause.  She is not having body changes associated with pregnancy.  She has a history of apnea but it is not using her CPAP machine because it dries her out.  She is nonfasting today.  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  She lives with her husband.  Her children are groomed.  Well.  Her father is still living independently at age 52.  Her mother passed in her 10s due to multiple medical problems associated with smoking and high cholesterol.  She has been having some lower back pain since she gained her weight.  She is scheduled for a Pap smear with her GYN.    Outpatient Medications Prior to Visit  Medication Sig Dispense Refill  . amLODipine (NORVASC) 10 MG tablet Take 1 tablet (10 mg total) by mouth daily. 90 tablet 1  . buPROPion (WELLBUTRIN XL) 150 MG 24 hr tablet Take 1 tablet (150 mg total) by mouth daily. (Patient not taking: Reported on 05/28/2017) 180 tablet 1  . levothyroxine (SYNTHROID, LEVOTHROID) 25 MCG tablet take 1 tablet  by mouth once daily BEFORE BREAKFAST (Patient not taking: Reported on 05/28/2017) 90 tablet 1  . lisinopril-hydrochlorothiazide (ZESTORETIC) 20-12.5 MG tablet Take 1 tablet by mouth daily. (Patient not taking: Reported on 05/28/2017) 90 tablet 1  . Multiple Vitamin (MULITIVITAMIN WITH MINERALS) TABS Take 1 tablet by mouth daily.     No facility-administered medications prior to visit.     ROS Review of Systems  Constitutional: Positive for unexpected weight change. Negative for chills and fever.  HENT: Negative.   Eyes: Negative for photophobia and visual disturbance.  Respiratory: Positive for cough. Negative for shortness of breath and wheezing.   Cardiovascular: Negative for chest pain.  Gastrointestinal: Negative.  Negative for abdominal pain, blood in stool and constipation.  Endocrine: Negative for cold intolerance, heat intolerance, polyphagia and polyuria.  Genitourinary: Negative.   Musculoskeletal: Positive for arthralgias and back pain.  Skin: Negative for pallor and wound.  Allergic/Immunologic: Negative for immunocompromised state.  Neurological: Negative for speech difficulty, weakness and headaches.  Psychiatric/Behavioral: Positive for dysphoric mood. Negative for behavioral problems.    Objective:  BP (!) 144/90 (BP Location: Left Arm, Patient Position: Sitting, Cuff Size: Normal)   Pulse 80   Ht 5' 6.5" (1.689 m)   Wt 254 lb 8 oz (115.4 kg)   SpO2 98%   BMI 40.46 kg/m   BP Readings from Last 3 Encounters:  05/28/17 (!) 144/90  08/27/16  120/70  09/05/15 124/82    Wt Readings from Last 3 Encounters:  05/28/17 254 lb 8 oz (115.4 kg)  08/27/16 221 lb (100.2 kg)  09/05/15 229 lb (103.9 kg)    Physical Exam  Constitutional: She is oriented to person, place, and time. She appears well-developed and well-nourished. No distress.  HENT:  Head: Normocephalic and atraumatic.  Right Ear: External ear normal.  Left Ear: External ear normal.  Mouth/Throat: Oropharynx  is clear and moist. No oropharyngeal exudate.    Eyes: Conjunctivae are normal. Pupils are equal, round, and reactive to light. Right eye exhibits no discharge. Left eye exhibits no discharge. No scleral icterus.  Neck: Neck supple. No JVD present. No tracheal deviation present. No thyromegaly present.  Cardiovascular: Normal rate, regular rhythm and normal heart sounds.  Pulmonary/Chest: Effort normal and breath sounds normal. No stridor.  Abdominal: Bowel sounds are normal.  Lymphadenopathy:    She has no cervical adenopathy.  Neurological: She is alert and oriented to person, place, and time.  Skin: Skin is warm and dry. She is not diaphoretic.  Psychiatric: She has a normal mood and affect. Her behavior is normal.    Lab Results  Component Value Date   WBC 5.3 05/28/2017   HGB 14.9 05/28/2017   HCT 44.3 05/28/2017   PLT 218.0 05/28/2017   GLUCOSE 88 05/28/2017   CHOL 193 05/28/2017   TRIG 250.0 (H) 05/28/2017   HDL 46.50 05/28/2017   LDLDIRECT 108.0 05/28/2017   LDLCALC 156 (H) 08/27/2016   ALT 13 05/28/2017   AST 17 05/28/2017   NA 139 05/28/2017   K 4.6 05/28/2017   CL 102 05/28/2017   CREATININE 0.51 05/28/2017   BUN 14 05/28/2017   CO2 28 05/28/2017   TSH 2.37 05/28/2017   INR 1.08 08/17/2011    Dg Knee Complete 4 Views Left  Result Date: 09/05/2015 CLINICAL DATA:  Chronic pain, no known injury, initial encounter EXAM: LEFT KNEE - COMPLETE 4+ VIEW COMPARISON:  None. FINDINGS: No acute fracture or dislocation is noted. No joint effusion is seen. Minimal osteophytic changes are noted medially and laterally. IMPRESSION: Minimal degenerative change.  No acute bony abnormality. Electronically Signed   By: Alcide CleverMark  Lukens M.D.   On: 09/05/2015 15:48    Assessment & Plan:   Santina EvansCatherine was seen today for establish care.  Diagnoses and all orders for this visit:  Essential hypertension -     Cancel: CBC -     Cancel: Comprehensive metabolic panel -     Cancel: TSH -      Cancel: Urinalysis, Routine w reflex microscopic -     amLODipine (NORVASC) 10 MG tablet; Take 1 tablet (10 mg total) by mouth daily. -     chlorthalidone (HYGROTON) 25 MG tablet; Take 1 tablet (25 mg total) by mouth daily. -     CBC; Future -     Comprehensive metabolic panel; Future -     TSH; Future -     Urinalysis, Routine w reflex microscopic; Future -     Urinalysis, Routine w reflex microscopic -     TSH -     Comprehensive metabolic panel -     CBC  Hypothyroidism, unspecified type -     Cancel: TSH -     Follicle stimulating hormone; Future -     TSH; Future -     TSH -     Follicle stimulating hormone  Anxiety and depression -  buPROPion (WELLBUTRIN XL) 150 MG 24 hr tablet; Take 1 tablet (150 mg total) by mouth daily.  Tobacco use disorder -     buPROPion (WELLBUTRIN XL) 150 MG 24 hr tablet; Take 1 tablet (150 mg total) by mouth daily.  Class 3 severe obesity due to excess calories with body mass index (BMI) of 40.0 to 44.9 in adult, unspecified whether serious comorbidity present (HCC) -     Cancel: TSH -     buPROPion (WELLBUTRIN XL) 150 MG 24 hr tablet; Take 1 tablet (150 mg total) by mouth daily. -     CBC; Future -     Comprehensive metabolic panel; Future -     Lipid panel; Future -     Lipid panel -     Comprehensive metabolic panel -     CBC  Amenorrhea -     Cancel: FSH  Apnea -     Ambulatory referral to Sleep Studies  Recovering alcoholic (HCC) -     Cancel: Comprehensive metabolic panel  Health care maintenance -     Cancel: HIV antibody -     Cancel: Lipid panel -     CBC; Future -     Comprehensive metabolic panel; Future -     Follicle stimulating hormone; Future -     HIV antibody; Future -     Lipid panel; Future -     TSH; Future -     Urinalysis, Routine w reflex microscopic; Future -     Urinalysis, Routine w reflex microscopic -     TSH -     Lipid panel -     HIV antibody -     Follicle stimulating hormone -      Comprehensive metabolic panel -     CBC  Other orders -     LDL cholesterol, direct   I have discontinued Evalee Mutton. Reitz's multivitamin with minerals, levothyroxine, and lisinopril-hydrochlorothiazide. I am also having her start on chlorthalidone. Additionally, I am having her maintain her amLODipine and buPROPion.  Meds ordered this encounter  Medications  . amLODipine (NORVASC) 10 MG tablet    Sig: Take 1 tablet (10 mg total) by mouth daily.    Dispense:  90 tablet    Refill:  0  . buPROPion (WELLBUTRIN XL) 150 MG 24 hr tablet    Sig: Take 1 tablet (150 mg total) by mouth daily.    Dispense:  90 tablet    Refill:  0  . chlorthalidone (HYGROTON) 25 MG tablet    Sig: Take 1 tablet (25 mg total) by mouth daily.    Dispense:  100 tablet    Refill:  0   She is nonfasting today but we will go ahead and check her blood work.  Restart Wellbutrin because I believe it will potentially help with several of her problems.  Restarted amlodipine with chlorthalidone.  She tells me that her cholesterol is also been elevated in the past.  Restarted levothyroxine pends results of TSH.  Follow-up: Return in about 3 months (around 08/28/2017).  Mliss Sax, MD

## 2017-05-28 NOTE — Telephone Encounter (Signed)
Contacted pt. And she will have [facility] transfer her prescriptions.   Copied from CRM [phone]. Topic: General - Other >> May 28, 2017  2:34 PM Elliot GaultBell, Tiffany M wrote:  Relation to pt: self Call back number: [phone] [id number] - [place], [place] - [address] [phone])  Reason for call:  Patient states she would like all medication sent in today, [facility]  [phone], please advise

## 2017-05-28 NOTE — Patient Instructions (Addendum)
Coping with Quitting Smoking Quitting smoking is a physical and mental challenge. You will face cravings, withdrawal symptoms, and temptation. Before quitting, work with your health care provider to make a plan that can help you cope. Preparation can help you quit and keep you from giving in. How can I cope with cravings? Cravings usually last for 5-10 minutes. If you get through it, the craving will pass. Consider taking the following actions to help you cope with cravings:  Keep your mouth busy: ? Chew sugar-free gum. ? Suck on hard candies or a straw. ? Brush your teeth.  Keep your hands and body busy: ? Immediately change to a different activity when you feel a craving. ? Squeeze or play with a ball. ? Do an activity or a hobby, like making bead jewelry, practicing needlepoint, or working with wood. ? Mix up your normal routine. ? Take a short exercise break. Go for a quick walk or run up and down stairs. ? Spend time in public places where smoking is not allowed.  Focus on doing something kind or helpful for someone else.  Call a friend or family member to talk during a craving.  Join a support group.  Call a quit line, such as 1-800-QUIT-NOW.  Talk with your health care provider about medicines that might help you cope with cravings and make quitting easier for you.  How can I deal with withdrawal symptoms? Your body may experience negative effects as it tries to get used to not having nicotine in the system. These effects are called withdrawal symptoms. They may include:  Feeling hungrier than normal.  Trouble concentrating.  Irritability.  Trouble sleeping.  Feeling depressed.  Restlessness and agitation.  Craving a cigarette.  To manage withdrawal symptoms:  Avoid places, people, and activities that trigger your cravings.  Remember why you want to quit.  Get plenty of sleep.  Avoid coffee and other caffeinated drinks. These may worsen some of your  symptoms.  How can I handle social situations? Social situations can be difficult when you are quitting smoking, especially in the first few weeks. To manage this, you can:  Avoid parties, bars, and other social situations where people might be smoking.  Avoid alcohol.  Leave right away if you have the urge to smoke.  Explain to your family and friends that you are quitting smoking. Ask for understanding and support.  Plan activities with friends or family where smoking is not an option.  What are some ways I can cope with stress? Wanting to smoke may cause stress, and stress can make you want to smoke. Find ways to manage your stress. Relaxation techniques can help. For example:  Breathe slowly and deeply, in through your nose and out through your mouth.  Listen to soothing, relaxing music.  Talk with a family member or friend about your stress.  Light a candle.  Soak in a bath or take a shower.  Think about a peaceful place.  What are some ways I can prevent weight gain? Be aware that many people gain weight after they quit smoking. However, not everyone does. To keep from gaining weight, have a plan in place before you quit and stick to the plan after you quit. Your plan should include:  Having healthy snacks. When you have a craving, it may help to: ? Eat plain popcorn, crunchy carrots, celery, or other cut vegetables. ? Chew sugar-free gum.  Changing how you eat: ? Eat small portion sizes at meals. ?  Eat 4-6 small meals throughout the day instead of 1-2 large meals a day. ? Be mindful when you eat. Do not watch television or do other things that might distract you as you eat.  Exercising regularly: ? Make time to exercise each day. If you do not have time for a long workout, do short bouts of exercise for 5-10 minutes several times a day. ? Do some form of strengthening exercise, like weight lifting, and some form of aerobic exercise, like running or  swimming.  Drinking plenty of water or other low-calorie or no-calorie drinks. Drink 6-8 glasses of water daily, or as much as instructed by your health care provider.  Summary  Quitting smoking is a physical and mental challenge. You will face cravings, withdrawal symptoms, and temptation to smoke again. Preparation can help you as you go through these challenges.  You can cope with cravings by keeping your mouth busy (such as by chewing gum), keeping your body and hands busy, and making calls to family, friends, or a helpline for people who want to quit smoking.  You can cope with withdrawal symptoms by avoiding places where people smoke, avoiding drinks with caffeine, and getting plenty of rest.  Ask your health care provider about the different ways to prevent weight gain, avoid stress, and handle social situations. This information is not intended to replace advice given to you by your health care provider. Make sure you discuss any questions you have with your health care provider. Document Released: 03/06/2016 Document Revised: 03/06/2016 Document Reviewed: 03/06/2016 Elsevier Interactive Patient Education  2018 [place] DASH stands for "Dietary Approaches to Stop Hypertension." The DASH eating plan is a healthy eating plan that has been shown to reduce high blood pressure (hypertension). It may also reduce your risk for type 2 diabetes, heart disease, and stroke. The DASH eating plan may also help with weight loss. What are tips for following this plan? General guidelines  Avoid eating more than 2,300 mg (milligrams) of salt (sodium) a day. If you have hypertension, you may need to reduce your sodium intake to 1,500 mg a day.  Limit alcohol intake to no more than 1 drink a day for nonpregnant women and 2 drinks a day for men. One drink equals 12 oz of beer, 5 oz of wine, or 1 oz of hard liquor.  Work with your health care provider to maintain a healthy body  weight or to lose weight. Ask what an ideal weight is for you.  Get at least 30 minutes of exercise that causes your heart to beat faster (aerobic exercise) most days of the week. Activities may include walking, swimming, or biking.  Work with your health care provider or diet and nutrition specialist (dietitian) to adjust your eating plan to your individual calorie needs. Reading food labels  Check food labels for the amount of sodium per serving. Choose foods with less than 5 percent of the Daily Value of sodium. Generally, foods with less than 300 mg of sodium per serving fit into this eating plan.  To find whole grains, look for the word "whole" as the first word in the ingredient list. Shopping  Buy products labeled as "low-sodium" or "no salt added."  Buy fresh foods. Avoid canned foods and premade or frozen meals. Cooking  Avoid adding salt when cooking. Use salt-free seasonings or herbs instead of table salt or sea salt. Check with your health care provider or pharmacist before using salt substitutes.  Do not fry foods. Cook foods using healthy methods such as baking, boiling, grilling, and broiling instead.  Cook with heart-healthy oils, such as olive, canola, soybean, or sunflower oil. Meal planning   Eat a balanced diet that includes: ? 5 or more servings of fruits and vegetables each day. At each meal, try to fill half of your plate with fruits and vegetables. ? Up to 6-8 servings of whole grains each day. ? Less than 6 oz of lean meat, poultry, or fish each day. A 3-oz serving of meat is about the same size as a deck of cards. One egg equals 1 oz. ? 2 servings of low-fat dairy each day. ? A serving of nuts, seeds, or beans 5 times each week. ? Heart-healthy fats. Healthy fats called Omega-3 fatty acids are found in foods such as flaxseeds and coldwater fish, like sardines, salmon, and mackerel.  Limit how much you eat of the following: ? Canned or prepackaged  foods. ? Food that is high in trans fat, such as fried foods. ? Food that is high in saturated fat, such as fatty meat. ? Sweets, desserts, sugary drinks, and other foods with added sugar. ? Full-fat dairy products.  Do not salt foods before eating.  Try to eat at least 2 vegetarian meals each week.  Eat more home-cooked food and less restaurant, buffet, and fast food.  When eating at a restaurant, ask that your food be prepared with less salt or no salt, if possible. What foods are recommended? The items listed may not be a complete list. Talk with your dietitian about what dietary choices are best for you. Grains Whole-grain or whole-wheat bread. Whole-grain or whole-wheat pasta. Brown rice. [place]. Bulgur. Whole-grain and low-sodium cereals. Pita bread. Low-fat, low-sodium crackers. Whole-wheat flour tortillas. Vegetables Fresh or frozen vegetables (raw, steamed, roasted, or grilled). Low-sodium or reduced-sodium tomato and vegetable juice. Low-sodium or reduced-sodium tomato sauce and tomato paste. Low-sodium or reduced-sodium canned vegetables. Fruits All fresh, dried, or frozen fruit. Canned fruit in natural juice (without added sugar). Meat and other protein foods Skinless chicken or [place]. Ground chicken or [place]. Pork with fat trimmed off. Fish and seafood. Egg whites. Dried beans, peas, or lentils. Unsalted nuts, nut butters, and seeds. Unsalted canned beans. Lean cuts of beef with fat trimmed off. Low-sodium, lean deli meat. Dairy Low-fat (1%) or fat-free (skim) milk. Fat-free, low-fat, or reduced-fat cheeses. Nonfat, low-sodium ricotta or cottage cheese. Low-fat or nonfat yogurt. Low-fat, low-sodium cheese. Fats and oils Soft margarine without trans fats. Vegetable oil. Low-fat, reduced-fat, or light mayonnaise and salad dressings (reduced-sodium). Canola, safflower, olive, soybean, and sunflower oils. Avocado. Seasoning and other foods Herbs. Spices. Seasoning  mixes without salt. Unsalted popcorn and pretzels. Fat-free sweets. What foods are not recommended? The items listed may not be a complete list. Talk with your dietitian about what dietary choices are best for you. Grains Baked goods made with fat, such as croissants, muffins, or some breads. Dry pasta or rice meal packs. Vegetables Creamed or fried vegetables. Vegetables in a cheese sauce. Regular canned vegetables (not low-sodium or reduced-sodium). Regular canned tomato sauce and paste (not low-sodium or reduced-sodium). Regular tomato and vegetable juice (not low-sodium or reduced-sodium). Angie Fava. Olives. Fruits Canned fruit in a light or heavy syrup. Fried fruit. Fruit in cream or butter sauce. Meat and other protein foods Fatty cuts of meat. Ribs. Fried meat. Berniece Salines. Sausage. Bologna and other processed lunch meats. Salami. Fatback. Hotdogs. Bratwurst. Salted nuts and seeds. Canned beans  with added salt. Canned or smoked fish. Whole eggs or egg yolks. Chicken or [place] with skin. Dairy Whole or 2% milk, cream, and half-and-half. Whole or full-fat cream cheese. Whole-fat or sweetened yogurt. Full-fat cheese. Nondairy creamers. Whipped toppings. Processed cheese and cheese spreads. Fats and oils Butter. Stick margarine. Lard. Shortening. Ghee. Bacon fat. Tropical oils, such as coconut, palm kernel, or palm oil. Seasoning and other foods Salted popcorn and pretzels. Onion salt, garlic salt, seasoned salt, table salt, and sea salt. Worcestershire sauce. Tartar sauce. Barbecue sauce. Teriyaki sauce. Soy sauce, including reduced-sodium. Steak sauce. Canned and packaged gravies. Fish sauce. Oyster sauce. Cocktail sauce. Horseradish that you find on the shelf. Ketchup. Mustard. Meat flavorings and tenderizers. Bouillon cubes. Hot sauce and Tabasco sauce. Premade or packaged marinades. Premade or packaged taco seasonings. Relishes. Regular salad dressings. Where to find more information:  National  Heart, Lung, and [place]: https://wilson-eaton.com/  American Heart Association: www.heart.org Summary  The DASH eating plan is a healthy eating plan that has been shown to reduce high blood pressure (hypertension). It may also reduce your risk for type 2 diabetes, heart disease, and stroke.  With the DASH eating plan, you should limit salt (sodium) intake to 2,300 mg a day. If you have hypertension, you may need to reduce your sodium intake to 1,500 mg a day.  When on the DASH eating plan, aim to eat more fresh fruits and vegetables, whole grains, lean proteins, low-fat dairy, and heart-healthy fats.  Work with your health care provider or diet and nutrition specialist (dietitian) to adjust your eating plan to your individual calorie needs. This information is not intended to replace advice given to you by your health care provider. Make sure you discuss any questions you have with your health care provider. Document Released: 02/26/2011 Document Revised: 03/02/2016 Document Reviewed: 03/02/2016 Elsevier Interactive Patient Education  2018 [place].  CPAP and BiPAP Information CPAP and BiPAP are methods of helping a person breathe with the use of air pressure. CPAP stands for "continuous positive airway pressure." BiPAP stands for "bi-level positive airway pressure." In both methods, air is blown through your nose or mouth and into your air passages to help you breathe well. CPAP and BiPAP use different amounts of pressure to blow air. With CPAP, the amount of pressure stays the same while you breathe in and out. With BiPAP, the amount of pressure is increased when you breathe in (inhale) so that you can take larger breaths. Your health care provider will recommend whether CPAP or BiPAP would be more helpful for you. Why are CPAP and BiPAP treatments used? CPAP or BiPAP can be helpful if you have:  Sleep apnea.  Chronic obstructive pulmonary disease (COPD).  Heart failure.  Medical  conditions that weaken the muscles of the chest including muscular dystrophy, or neurological diseases such as amyotrophic lateral sclerosis (ALS).  Other problems that cause breathing to be weak, abnormal, or difficult.  CPAP is most commonly used for obstructive sleep apnea (OSA) to keep the airways from collapsing when the muscles relax during sleep. How is CPAP or BiPAP administered? Both CPAP and BiPAP are provided by a small machine with a flexible plastic tube that attaches to a plastic mask. You wear the mask. Air is blown through the mask into your nose or mouth. The amount of pressure that is used to blow the air can be adjusted on the machine. Your health care provider will determine the pressure setting that should be used based  on your individual needs. When should CPAP or BiPAP be used? In most cases, the mask only needs to be worn during sleep. Generally, the mask needs to be worn throughout the night and during any daytime naps. People with certain medical conditions may also need to wear the mask at other times when they are awake. Follow instructions from your health care provider about when to use the machine. What are some tips for using the mask?  Because the mask needs to be snug, some people feel trapped or closed-in (claustrophobic) when first using the mask. If you feel this way, you may need to get used to the mask. One way to do this is by holding the mask loosely over your nose or mouth and then gradually applying the mask more snugly. You can also gradually increase the amount of time that you use the mask.  Masks are available in various types and sizes. Some fit over your mouth and nose while others fit over just your nose. If your mask does not fit well, talk with your health care provider about getting a different one.  If you are using a mask that fits over your nose and you tend to breathe through your mouth, a chin strap may be applied to help keep your mouth  closed.  The CPAP and BiPAP machines have alarms that may sound if the mask comes off or develops a leak.  If you have trouble with the mask, it is very important that you talk with your health care provider about finding a way to make the mask easier to tolerate. Do not stop using the mask. Stopping the use of the mask could have a negative impact on your health. What are some tips for using the machine?  Place your CPAP or BiPAP machine on a secure table or stand near an electrical outlet.  Know where the on/off switch is located on the machine.  Follow instructions from your health care provider about how to set the pressure on your machine and when you should use it.  Do not eat or drink while the CPAP or BiPAP machine is on. Food or fluids could get pushed into your lungs by the pressure of the CPAP or BiPAP.  Do not smoke. Tobacco smoke residue can damage the machine.  For home use, CPAP and BiPAP machines can be rented or purchased through home health care companies. Many different brands of machines are available. Renting a machine before purchasing may help you find out which particular machine works well for you.  Keep the CPAP or BiPAP machine and attachments clean. Ask your health care provider for specific instructions. Get help right away if:  You have redness or open areas around your nose or mouth where the mask fits.  You have trouble using the CPAP or BiPAP machine.  You cannot tolerate wearing the CPAP or BiPAP mask.  You have pain, discomfort, and bloating in your abdomen. Summary  CPAP and BiPAP are methods of helping a person breathe with the use of air pressure.  Both CPAP and BiPAP are provided by a small machine with a flexible plastic tube that attaches to a plastic mask.  If you have trouble with the mask, it is very important that you talk with your health care provider about finding a way to make the mask easier to tolerate. This information is not  intended to replace advice given to you by your health care provider. Make sure you discuss any  questions you have with your health care provider. Document Released: 12/06/2003 Document Revised: 01/27/2016 Document Reviewed: 01/27/2016 Elsevier Interactive Patient Education  2017 [place] DASH stands for "Dietary Approaches to Stop Hypertension." The DASH eating plan is a healthy eating plan that has been shown to reduce high blood pressure (hypertension). It may also reduce your risk for type 2 diabetes, heart disease, and stroke. The DASH eating plan may also help with weight loss. What are tips for following this plan? General guidelines  Avoid eating more than 2,300 mg (milligrams) of salt (sodium) a day. If you have hypertension, you may need to reduce your sodium intake to 1,500 mg a day.  Limit alcohol intake to no more than 1 drink a day for nonpregnant women and 2 drinks a day for men. One drink equals 12 oz of beer, 5 oz of wine, or 1 oz of hard liquor.  Work with your health care provider to maintain a healthy body weight or to lose weight. Ask what an ideal weight is for you.  Get at least 30 minutes of exercise that causes your heart to beat faster (aerobic exercise) most days of the week. Activities may include walking, swimming, or biking.  Work with your health care provider or diet and nutrition specialist (dietitian) to adjust your eating plan to your individual calorie needs. Reading food labels  Check food labels for the amount of sodium per serving. Choose foods with less than 5 percent of the Daily Value of sodium. Generally, foods with less than 300 mg of sodium per serving fit into this eating plan.  To find whole grains, look for the word "whole" as the first word in the ingredient list. Shopping  Buy products labeled as "low-sodium" or "no salt added."  Buy fresh foods. Avoid canned foods and premade or frozen meals. Cooking  Avoid  adding salt when cooking. Use salt-free seasonings or herbs instead of table salt or sea salt. Check with your health care provider or pharmacist before using salt substitutes.  Do not fry foods. Cook foods using healthy methods such as baking, boiling, grilling, and broiling instead.  Cook with heart-healthy oils, such as olive, canola, soybean, or sunflower oil. Meal planning   Eat a balanced diet that includes: ? 5 or more servings of fruits and vegetables each day. At each meal, try to fill half of your plate with fruits and vegetables. ? Up to 6-8 servings of whole grains each day. ? Less than 6 oz of lean meat, poultry, or fish each day. A 3-oz serving of meat is about the same size as a deck of cards. One egg equals 1 oz. ? 2 servings of low-fat dairy each day. ? A serving of nuts, seeds, or beans 5 times each week. ? Heart-healthy fats. Healthy fats called Omega-3 fatty acids are found in foods such as flaxseeds and coldwater fish, like sardines, salmon, and mackerel.  Limit how much you eat of the following: ? Canned or prepackaged foods. ? Food that is high in trans fat, such as fried foods. ? Food that is high in saturated fat, such as fatty meat. ? Sweets, desserts, sugary drinks, and other foods with added sugar. ? Full-fat dairy products.  Do not salt foods before eating.  Try to eat at least 2 vegetarian meals each week.  Eat more home-cooked food and less restaurant, buffet, and fast food.  When eating at a restaurant, ask that your food be prepared with  less salt or no salt, if possible. What foods are recommended? The items listed may not be a complete list. Talk with your dietitian about what dietary choices are best for you. Grains Whole-grain or whole-wheat bread. Whole-grain or whole-wheat pasta. Brown rice. [place]. Bulgur. Whole-grain and low-sodium cereals. Pita bread. Low-fat, low-sodium crackers. Whole-wheat flour tortillas. Vegetables Fresh or  frozen vegetables (raw, steamed, roasted, or grilled). Low-sodium or reduced-sodium tomato and vegetable juice. Low-sodium or reduced-sodium tomato sauce and tomato paste. Low-sodium or reduced-sodium canned vegetables. Fruits All fresh, dried, or frozen fruit. Canned fruit in natural juice (without added sugar). Meat and other protein foods Skinless chicken or [place]. Ground chicken or [place]. Pork with fat trimmed off. Fish and seafood. Egg whites. Dried beans, peas, or lentils. Unsalted nuts, nut butters, and seeds. Unsalted canned beans. Lean cuts of beef with fat trimmed off. Low-sodium, lean deli meat. Dairy Low-fat (1%) or fat-free (skim) milk. Fat-free, low-fat, or reduced-fat cheeses. Nonfat, low-sodium ricotta or cottage cheese. Low-fat or nonfat yogurt. Low-fat, low-sodium cheese. Fats and oils Soft margarine without trans fats. Vegetable oil. Low-fat, reduced-fat, or light mayonnaise and salad dressings (reduced-sodium). Canola, safflower, olive, soybean, and sunflower oils. Avocado. Seasoning and other foods Herbs. Spices. Seasoning mixes without salt. Unsalted popcorn and pretzels. Fat-free sweets. What foods are not recommended? The items listed may not be a complete list. Talk with your dietitian about what dietary choices are best for you. Grains Baked goods made with fat, such as croissants, muffins, or some breads. Dry pasta or rice meal packs. Vegetables Creamed or fried vegetables. Vegetables in a cheese sauce. Regular canned vegetables (not low-sodium or reduced-sodium). Regular canned tomato sauce and paste (not low-sodium or reduced-sodium). Regular tomato and vegetable juice (not low-sodium or reduced-sodium). Angie Fava. Olives. Fruits Canned fruit in a light or heavy syrup. Fried fruit. Fruit in cream or butter sauce. Meat and other protein foods Fatty cuts of meat. Ribs. Fried meat. Berniece Salines. Sausage. Bologna and other processed lunch meats. Salami. Fatback. Hotdogs.  Bratwurst. Salted nuts and seeds. Canned beans with added salt. Canned or smoked fish. Whole eggs or egg yolks. Chicken or [place] with skin. Dairy Whole or 2% milk, cream, and half-and-half. Whole or full-fat cream cheese. Whole-fat or sweetened yogurt. Full-fat cheese. Nondairy creamers. Whipped toppings. Processed cheese and cheese spreads. Fats and oils Butter. Stick margarine. Lard. Shortening. Ghee. Bacon fat. Tropical oils, such as coconut, palm kernel, or palm oil. Seasoning and other foods Salted popcorn and pretzels. Onion salt, garlic salt, seasoned salt, table salt, and sea salt. Worcestershire sauce. Tartar sauce. Barbecue sauce. Teriyaki sauce. Soy sauce, including reduced-sodium. Steak sauce. Canned and packaged gravies. Fish sauce. Oyster sauce. Cocktail sauce. Horseradish that you find on the shelf. Ketchup. Mustard. Meat flavorings and tenderizers. Bouillon cubes. Hot sauce and Tabasco sauce. Premade or packaged marinades. Premade or packaged taco seasonings. Relishes. Regular salad dressings. Where to find more information:  National Heart, Lung, and [place]: https://wilson-eaton.com/  American Heart Association: www.heart.org Summary  The DASH eating plan is a healthy eating plan that has been shown to reduce high blood pressure (hypertension). It may also reduce your risk for type 2 diabetes, heart disease, and stroke.  With the DASH eating plan, you should limit salt (sodium) intake to 2,300 mg a day. If you have hypertension, you may need to reduce your sodium intake to 1,500 mg a day.  When on the DASH eating plan, aim to eat more fresh fruits and vegetables, whole grains, lean proteins,  low-fat dairy, and heart-healthy fats.  Work with your health care provider or diet and nutrition specialist (dietitian) to adjust your eating plan to your individual calorie needs. This information is not intended to replace advice given to you by your health care provider. Make sure you  discuss any questions you have with your health care provider. Document Released: 02/26/2011 Document Revised: 03/02/2016 Document Reviewed: 03/02/2016 Elsevier Interactive Patient Education  2018 [employer].  Exercising to [employer] Exercising can help you to lose weight. In order to lose weight through exercise, you need to do vigorous-intensity exercise. You can tell that you are exercising with vigorous intensity if you are breathing very hard and fast and cannot hold a conversation while exercising. Moderate-intensity exercise helps to maintain your current weight. You can tell that you are exercising at a moderate level if you have a higher heart rate and faster breathing, but you are still able to hold a conversation. How often should I exercise? Choose an activity that you enjoy and set realistic goals. Your health care provider can help you to make an activity plan that works for you. Exercise regularly as directed by your health care provider. This may include:  Doing resistance training twice each week, such as: ? Push-ups. ? Sit-ups. ? Lifting weights. ? Using resistance bands.  Doing a given intensity of exercise for a given amount of time. Choose from these options: ? 150 minutes of moderate-intensity exercise every week. ? 75 minutes of vigorous-intensity exercise every week. ? A mix of moderate-intensity and vigorous-intensity exercise every week.  Children, pregnant women, people who are out of shape, people who are overweight, and older adults may need to consult a health care provider for individual recommendations. If you have any sort of medical condition, be sure to consult your health care provider before starting a new exercise program. What are some activities that can help me to lose weight?  Walking at a rate of at least 4.5 miles an hour.  Jogging or running at a rate of 5 miles per hour.  Biking at a rate of at least 10 miles per hour.  Lap  swimming.  Roller-skating or in-line skating.  Cross-country skiing.  Vigorous competitive sports, such as football, basketball, and soccer.  Jumping rope.  Aerobic dancing. How can I be more active in my day-to-day activities?  Use the stairs instead of the elevator.  Take a walk during your lunch break.  If you drive, park your car farther away from work or school.  If you take public transportation, get off one stop early and walk the rest of the way.  Make all of your phone calls while standing up and walking around.  Get up, stretch, and walk around every 30 minutes throughout the day. What guidelines should I follow while exercising?  Do not exercise so much that you hurt yourself, feel dizzy, or get very short of breath.  Consult your health care provider prior to starting a new exercise program.  Wear comfortable clothes and shoes with good support.  Drink plenty of water while you exercise to prevent dehydration or heat stroke. Body water is lost during exercise and must be replaced.  Work out until you breathe faster and your heart beats faster. This information is not intended to replace advice given to you by your health care provider. Make sure you discuss any questions you have with your health care provider. Document Released: 04/11/2010 Document Revised: 08/15/2015 Document Reviewed: 08/10/2013 Elsevier Interactive  Patient Education  2018 [place] Maintenance, Female Adopting a healthy lifestyle and getting preventive care can go a long way to promote health and wellness. Talk with your health care provider about what schedule of regular examinations is right for you. This is a good chance for you to check in with your provider about disease prevention and staying healthy. In between checkups, there are plenty of things you can do on your own. Experts have done a lot of research about which lifestyle changes and preventive measures are most likely  to keep you healthy. Ask your health care provider for more information. Weight and diet Eat a healthy diet  Be sure to include plenty of vegetables, fruits, low-fat dairy products, and lean protein.  Do not eat a lot of foods high in solid fats, added sugars, or salt.  Get regular exercise. This is one of the most important things you can do for your health. ? Most adults should exercise for at least 150 minutes each week. The exercise should increase your heart rate and make you sweat (moderate-intensity exercise). ? Most adults should also do strengthening exercises at least twice a week. This is in addition to the moderate-intensity exercise.  Maintain a healthy weight  Body mass index (BMI) is a measurement that can be used to identify possible weight problems. It estimates body fat based on height and weight. Your health care provider can help determine your BMI and help you achieve or maintain a healthy weight.  For females 70 years of age and older: ? A BMI below 18.5 is considered underweight. ? A BMI of 18.5 to 24.9 is normal. ? A BMI of 25 to 29.9 is considered overweight. ? A BMI of 30 and above is considered obese.  Watch levels of cholesterol and blood lipids  You should start having your blood tested for lipids and cholesterol at 47 years of age, then have this test every 5 years.  You may need to have your cholesterol levels checked more often if: ? Your lipid or cholesterol levels are high. ? You are older than 47 years of age. ? You are at high risk for heart disease.  Cancer screening Lung Cancer  Lung cancer screening is recommended for adults 73-45 years old who are at high risk for lung cancer because of a history of smoking.  A yearly low-dose CT scan of the lungs is recommended for people who: ? Currently smoke. ? Have quit within the past 15 years. ? Have at least a 30-pack-year history of smoking. A pack year is smoking an average of one pack of  cigarettes a day for 1 year.  Yearly screening should continue until it has been 15 years since you quit.  Yearly screening should stop if you develop a health problem that would prevent you from having lung cancer treatment.  Breast Cancer  Practice breast self-awareness. This means understanding how your breasts normally appear and feel.  It also means doing regular breast self-exams. Let your health care provider know about any changes, no matter how small.  If you are in your 20s or 30s, you should have a clinical breast exam (CBE) by a health care provider every 1-3 years as part of a regular health exam.  If you are 57 or older, have a CBE every year. Also consider having a breast X-ray (mammogram) every year.  If you have a family history of breast cancer, talk to your health care provider about genetic screening.  If you are at high risk for breast cancer, talk to your health care provider about having an MRI and a mammogram every year.  Breast cancer gene (BRCA) assessment is recommended for women who have family members with BRCA-related cancers. BRCA-related cancers include: ? Breast. ? Ovarian. ? Tubal. ? Peritoneal cancers.  Results of the assessment will determine the need for genetic counseling and BRCA1 and BRCA2 testing.  Cervical Cancer Your health care provider may recommend that you be screened regularly for cancer of the pelvic organs (ovaries, uterus, and vagina). This screening involves a pelvic examination, including checking for microscopic changes to the surface of your cervix (Pap test). You may be encouraged to have this screening done every 3 years, beginning at age 70.  For women ages 82-65, health care providers may recommend pelvic exams and Pap testing every 3 years, or they may recommend the Pap and pelvic exam, combined with testing for human papilloma virus (HPV), every 5 years. Some types of HPV increase your risk of cervical cancer. Testing for HPV  may also be done on women of any age with unclear Pap test results.  Other health care providers may not recommend any screening for nonpregnant women who are considered low risk for pelvic cancer and who do not have symptoms. Ask your health care provider if a screening pelvic exam is right for you.  If you have had past treatment for cervical cancer or a condition that could lead to cancer, you need Pap tests and screening for cancer for at least 20 years after your treatment. If Pap tests have been discontinued, your risk factors (such as having a new sexual partner) need to be reassessed to determine if screening should resume. Some women have medical problems that increase the chance of getting cervical cancer. In these cases, your health care provider may recommend more frequent screening and Pap tests.  Colorectal Cancer  This type of cancer can be detected and often prevented.  Routine colorectal cancer screening usually begins at 47 years of age and continues through 47 years of age.  Your health care provider may recommend screening at an earlier age if you have risk factors for colon cancer.  Your health care provider may also recommend using home test kits to check for hidden blood in the stool.  A small camera at the end of a tube can be used to examine your colon directly (sigmoidoscopy or colonoscopy). This is done to check for the earliest forms of colorectal cancer.  Routine screening usually begins at age 36.  Direct examination of the colon should be repeated every 5-10 years through 47 years of age. However, you may need to be screened more often if early forms of precancerous polyps or small growths are found.  Skin Cancer  Check your skin from head to toe regularly.  Tell your health care provider about any new moles or changes in moles, especially if there is a change in a mole's shape or color.  Also tell your health care provider if you have a mole that is larger  than the size of a pencil eraser.  Always use sunscreen. Apply sunscreen liberally and repeatedly throughout the day.  Protect yourself by wearing long sleeves, pants, a wide-brimmed hat, and sunglasses whenever you are outside.  Heart disease, diabetes, and high blood pressure  High blood pressure causes heart disease and increases the risk of stroke. High blood pressure is more likely to develop in: ? People who have blood  pressure in the high end of the normal range (130-139/85-89 mm Hg). ? People who are overweight or obese. ? People who are African American.  If you are 98-49 years of age, have your blood pressure checked every 3-5 years. If you are 39 years of age or older, have your blood pressure checked every year. You should have your blood pressure measured twice-once when you are at a hospital or clinic, and once when you are not at a hospital or clinic. Record the average of the two measurements. To check your blood pressure when you are not at a hospital or clinic, you can use: ? An automated blood pressure machine at a pharmacy. ? A home blood pressure monitor.  If you are between 17 years and 51 years old, ask your health care provider if you should take aspirin to prevent strokes.  Have regular diabetes screenings. This involves taking a blood sample to check your fasting blood sugar level. ? If you are at a normal weight and have a low risk for diabetes, have this test once every three years after 47 years of age. ? If you are overweight and have a high risk for diabetes, consider being tested at a younger age or more often. Preventing infection Hepatitis B  If you have a higher risk for hepatitis B, you should be screened for this virus. You are considered at high risk for hepatitis B if: ? You were born in a country where hepatitis B is common. Ask your health care provider which countries are considered high risk. ? Your parents were born in a high-risk country, and  you have not been immunized against hepatitis B (hepatitis B vaccine). ? You have HIV or AIDS. ? You use needles to inject street drugs. ? You live with someone who has hepatitis B. ? You have had sex with someone who has hepatitis B. ? You get hemodialysis treatment. ? You take certain medicines for conditions, including cancer, organ transplantation, and autoimmune conditions.  Hepatitis C  Blood testing is recommended for: ? Everyone born from [birth year] through 1965. ? Anyone with known risk factors for hepatitis C.  Sexually transmitted infections (STIs)  You should be screened for sexually transmitted infections (STIs) including gonorrhea and chlamydia if: ? You are sexually active and are younger than 47 years of age. ? You are older than 47 years of age and your health care provider tells you that you are at risk for this type of infection. ? Your sexual activity has changed since you were last screened and you are at an increased risk for chlamydia or gonorrhea. Ask your health care provider if you are at risk.  If you do not have HIV, but are at risk, it may be recommended that you take a prescription medicine daily to prevent HIV infection. This is called pre-exposure prophylaxis (PrEP). You are considered at risk if: ? You are sexually active and do not regularly use condoms or know the HIV status of your partner(s). ? You take drugs by injection. ? You are sexually active with a partner who has HIV.  Talk with your health care provider about whether you are at high risk of being infected with HIV. If you choose to begin PrEP, you should first be tested for HIV. You should then be tested every 3 months for as long as you are taking PrEP. Pregnancy  If you are premenopausal and you may become pregnant, ask your health care provider about preconception counseling.  If you may become pregnant, take 400 to 800 micrograms (mcg) of folic acid every day.  If you want to prevent  pregnancy, talk to your health care provider about birth control (contraception). Osteoporosis and menopause  Osteoporosis is a disease in which the bones lose minerals and strength with aging. This can result in serious bone fractures. Your risk for osteoporosis can be identified using a bone density scan.  If you are 38 years of age or older, or if you are at risk for osteoporosis and fractures, ask your health care provider if you should be screened.  Ask your health care provider whether you should take a calcium or vitamin D supplement to lower your risk for osteoporosis.  Menopause may have certain physical symptoms and risks.  Hormone replacement therapy may reduce some of these symptoms and risks. Talk to your health care provider about whether hormone replacement therapy is right for you. Follow these instructions at home:  Schedule regular health, dental, and eye exams.  Stay current with your immunizations.  Do not use any tobacco products including cigarettes, chewing tobacco, or electronic cigarettes.  If you are pregnant, do not drink alcohol.  If you are breastfeeding, limit how much and how often you drink alcohol.  Limit alcohol intake to no more than 1 drink per day for nonpregnant women. One drink equals 12 ounces of beer, 5 ounces of wine, or 1 ounces of hard liquor.  Do not use street drugs.  Do not share needles.  Ask your health care provider for help if you need support or information about quitting drugs.  Tell your health care provider if you often feel depressed.  Tell your health care provider if you have ever been abused or do not feel safe at home. This information is not intended to replace advice given to you by your health care provider. Make sure you discuss any questions you have with your health care provider. Document Released: 09/22/2010 Document Revised: 08/15/2015 Document Reviewed: 12/11/2014 Elsevier Interactive Patient Education  2018  [employer].  Hypothyroidism Hypothyroidism is a disorder of the thyroid. The thyroid is a large gland that is located in the lower front of the neck. The thyroid releases hormones that control how the body works. With hypothyroidism, the thyroid does not make enough of these hormones. What are the causes? Causes of hypothyroidism may include:  Viral infections.  Pregnancy.  Your own defense system (immune system) attacking your thyroid.  Certain medicines.  Birth defects.  Past radiation treatments to your head or neck.  Past treatment with radioactive iodine.  Past surgical removal of part or all of your thyroid.  Problems with the gland that is located in the center of your brain (pituitary).  What are the signs or symptoms? Signs and symptoms of hypothyroidism may include:  Feeling as though you have no energy (lethargy).  Inability to tolerate cold.  Weight gain that is not explained by a change in diet or exercise habits.  Dry skin.  Coarse hair.  Menstrual irregularity.  Slowing of thought processes.  Constipation.  Sadness or depression.  How is this diagnosed? Your health care provider may diagnose hypothyroidism with blood tests and ultrasound tests. How is this treated? Hypothyroidism is treated with medicine that replaces the hormones that your body does not make. After you begin treatment, it may take several weeks for symptoms to go away. Follow these instructions at home:  Take medicines only as directed by your health care provider.  If you start taking any new medicines, tell your health care provider.  Keep all follow-up visits as directed by your health care provider. This is important. As your condition improves, your dosage needs may change. You will need to have blood tests regularly so that your health care provider can watch your condition. Contact a health care provider if:  Your symptoms do not get better with treatment.  You are  taking thyroid replacement medicine and: ? You sweat excessively. ? You have tremors. ? You feel anxious. ? You lose weight rapidly. ? You cannot tolerate heat. ? You have emotional swings. ? You have diarrhea. ? You feel weak. Get help right away if:  You develop chest pain.  You develop an irregular heartbeat.  You develop a rapid heartbeat. This information is not intended to replace advice given to you by your health care provider. Make sure you discuss any questions you have with your health care provider. Document Released: 03/09/2005 Document Revised: 08/15/2015 Document Reviewed: 07/25/2013 Elsevier Interactive Patient Education  2018 [employer].

## 2017-05-29 LAB — HIV ANTIBODY (ROUTINE TESTING W REFLEX): HIV: NONREACTIVE

## 2017-05-31 NOTE — Addendum Note (Signed)
Addended by: Andrez GrimeKREMER, Saba Neuman A on: 05/31/2017 09:28 AM   Modules accepted: Orders

## 2017-07-20 ENCOUNTER — Ambulatory Visit: Payer: No Typology Code available for payment source | Admitting: Neurology

## 2017-07-20 ENCOUNTER — Encounter: Payer: Self-pay | Admitting: Neurology

## 2017-07-20 VITALS — BP 151/96 | HR 69 | Ht 66.5 in | Wt 248.5 lb

## 2017-07-20 DIAGNOSIS — Z789 Other specified health status: Secondary | ICD-10-CM

## 2017-07-20 DIAGNOSIS — Z820 Family history of epilepsy and other diseases of the nervous system: Secondary | ICD-10-CM | POA: Diagnosis not present

## 2017-07-20 DIAGNOSIS — R002 Palpitations: Secondary | ICD-10-CM | POA: Diagnosis not present

## 2017-07-20 DIAGNOSIS — G4733 Obstructive sleep apnea (adult) (pediatric): Secondary | ICD-10-CM | POA: Diagnosis not present

## 2017-07-20 DIAGNOSIS — E669 Obesity, unspecified: Secondary | ICD-10-CM

## 2017-07-20 DIAGNOSIS — F17200 Nicotine dependence, unspecified, uncomplicated: Secondary | ICD-10-CM | POA: Diagnosis not present

## 2017-07-20 NOTE — Patient Instructions (Addendum)

## 2017-07-20 NOTE — Progress Notes (Signed)
Subjective:    Patient ID: Angela Stark is a 47 y.o. female.  HPI     Angela Foley, MD, PhD [facility] [address] [address] [place], [place] [address]  Dear Dr. Doreene Burke,   I saw your patient, Angela Stark, upon your kind request in neurologic clinic today for initial consultation of her sleep disorder, in particular re-evaluation of her prior diagnosis of OSA. The patient is unaccompanied today. As you know, Angela Stark is a 47 year old right-handed woman with an underlying medical history of hypertension, hypothyroidism, smoking, anxiety, depression, reflux disease, and obesity, who reports snoring and excessive daytime somnolence. She reports a prior diagnosis of obstructive sleep apnea. She is no longer using his CPAP machine. She had difficulty tolerating CPAP secondary to mouth dryness, she used nasal pillows but had issues with mouth opening. She also has a deviated septum and had significant nasal irritation on one side versus the other. I reviewed your office note from 05/28/2017. Prior sleep study results are not available for my review today. She reports a sleep study over 5 years ago. Her Epworth sleepiness score is 5 out of 24, fatigue score is 25 out of 63. She is married and lives with her husband. They have 3 children. She smokes a half pack per day, she does not utilize alcohol currently, quit in 2018, quit cocaine use in 2005 (per Hx intake form). She drinks caffeine in the form of coffee, usually one cup per day on average. Rare soda, decaff sweet tea. She works as a Psychologist, sport and exercise, post-partum. Has 2 dogs. She works 3 12 hour shifts typically. She has to work either from 7 AM to 7 PM or 11 AM to 11 PM, her bedtime and rise time therefore vary. Her husband works day shift and sometimes she stays up as long as he does. She has to either be up at 4:30 or around 9 AM on other days. She does not have night to night nocturia, does not have  recurrent morning headaches. Her husband uses a CPAP machine successfully and she would be willing to get retested for sleep apnea try CPAP again. She has a 60 yo son and 2 teenage daughters. She is also planning to work harder on weight loss and smoking cessation. She has a history of palpitations at night including panic attacks. She feels that Wellbutrin has been quite helpful.  Her Past Medical History Is Significant For: Past Medical History:  Diagnosis Date  . Anxiety   . Depression   . GERD (gastroesophageal reflux disease)   . Heart murmur    When pregnant  . Hyperlipidemia   . Hypertension   . Phlebitis   . Sleep apnea     Her Past Surgical History Is Significant For: Past Surgical History:  Procedure Laterality Date  . OVARY SURGERY      Her Family History Is Significant For: Family History  Problem Relation Age of Onset  . Hypertension Mother   . Hyperlipidemia Mother   . Diabetes Mother   . Emphysema Mother   . Heart disease Father   . Hypertension Father   . Stroke Brother   . Lung cancer Maternal Grandmother     Her Social History Is Significant For: Social History   Socioeconomic History  . Marital status: Married    Spouse name: Not on file  . Number of children: 3  . Years of education: 27  . Highest education level: Not on file  Occupational History  . Occupation: CNA  Social Needs  . Financial resource strain: Not on file  . Food insecurity:    Worry: Not on file    Inability: Not on file  . Transportation needs:    Medical: Not on file    Non-medical: Not on file  Tobacco Use  . Smoking status: Current Every Day Smoker    Packs/day: 0.50    Years: 20.00    Pack years: 10.00    Types: Cigarettes  . Smokeless tobacco: Never Used  Substance and Sexual Activity  . Alcohol use: No  . Drug use: No  . Sexual activity: Yes    Birth control/protection: None  Lifestyle  . Physical activity:    Days per week: Not on file    Minutes per  session: Not on file  . Stress: Not on file  Relationships  . Social connections:    Talks on phone: Not on file    Gets together: Not on file    Attends religious service: Not on file    Active member of club or organization: Not on file    Attends meetings of clubs or organizations: Not on file    Relationship status: Not on file  Other Topics Concern  . Not on file  Social History Narrative   Fun: Fishing, camping   Denies religious beliefs effecting health care.     Her Allergies Are:  No Known Allergies:   Her Current Medications Are:  Outpatient Encounter Medications as of 07/20/2017  Medication Sig  . amLODipine (NORVASC) 10 MG tablet Take 1 tablet (10 mg total) by mouth daily.  Marland Kitchen buPROPion (WELLBUTRIN XL) 150 MG 24 hr tablet Take 1 tablet (150 mg total) by mouth daily.  . chlorthalidone (HYGROTON) 25 MG tablet Take 1 tablet (25 mg total) by mouth daily.   No facility-administered encounter medications on file as of 07/20/2017.   :  Review of Systems:  Out of a complete 14 point review of systems, all are reviewed and negative with the exception of these symptoms as listed below: Review of Systems  Neurological:       Patient diagnosed with sleep apnea in the past.  Epworth Sleepiness Scale 0= would never doze 1= slight chance of dozing 2= moderate chance of dozing 3= high chance of dozing  Sitting and reading:1 Watching TV:1 Sitting inactive in a public place (ex. Theater or meeting):0 As a passenger in a car for an hour without a break:1 Lying down to rest in the afternoon:2 Sitting and talking to someone:0 Sitting quietly after lunch (no alcohol):0 In a car, while stopped in traffic:0 Total: 5     Objective:  Neurological Exam  Physical Exam Physical Examination:   Vitals:   07/20/17 1300  BP: (!) 151/96  Pulse: 69    General Examination: The patient is a very pleasant 47 y.o. female in no acute distress. She appears well-developed and  well-nourished and well groomed.   HEENT: Normocephalic, atraumatic, pupils are equal, round and reactive to light and accommodation. Funduscopic exam is normal with sharp disc margins noted. Extraocular tracking is good without limitation to gaze excursion or nystagmus noted. Normal smooth pursuit is noted. Hearing is grossly intact. Tympanic membranes are clear bilaterally. Face is symmetric with normal facial animation and normal facial sensation. Speech is clear with no dysarthria noted. There is no hypophonia. There is no lip, neck/head, jaw or voice tremor. Neck is supple with full range of passive and active  motion. There are no carotid bruits on auscultation. Oropharynx exam reveals: mild mouth dryness, adequate dental hygiene and moderate airway crowding, due to tonsils in place, 2+ b/l. Mallampati is class I. Tongue protrudes centrally and palate elevates symmetrically. Neck size is 16 3/8 inches. She has a Mild overbite.   Chest: Clear to auscultation without wheezing, rhonchi or crackles noted.  Heart: S1+S2+0, regular and normal without murmurs, rubs or gallops noted.   Abdomen: Soft, non-tender and non-distended with normal bowel sounds appreciated on auscultation.  Extremities: There is no pitting edema in the distal lower extremities bilaterally. Pedal pulses are intact.  Skin: Warm and dry without trophic changes noted. There are mild varicose veins and spider.  Musculoskeletal: exam reveals no obvious joint deformities, tenderness or joint swelling or erythema.   Neurologically:  Mental status: The patient is awake, alert and oriented in all 4 spheres. Her immediate and remote memory, attention, language skills and fund of knowledge are appropriate. There is no evidence of aphasia, agnosia, apraxia or anomia. Speech is clear with normal prosody and enunciation. Thought process is linear. Mood is normal and affect is normal.  Cranial nerves II - XII are as described above under  HEENT exam. In addition: shoulder shrug is normal with equal shoulder height noted. Motor exam: Normal bulk, strength and tone is noted. There is no drift, tremor or rebound. Romberg is negative. Reflexes are 1+ throughout. Fine motor skills and coordination: intact with normal finger taps, normal hand movements, normal rapid alternating patting, normal foot taps and normal foot agility.  Cerebellar testing: No dysmetria or intention tremor on finger to nose testing. Heel to shin is unremarkable bilaterally. There is no truncal or gait ataxia.  Sensory exam: intact to light touch in the upper and lower extremities.  Gait, station and balance: She stands easily. No veering to one side is noted. No leaning to one side is noted. Posture is age-appropriate and stance is narrow based. Gait shows normal stride length and normal pace. No problems turning are noted. Tandem walk is unremarkable.   Assessment and Plan:  In summary, TANGIA PINARD is a very pleasant 47 y.o.-year old female with an underlying medical history of hypertension, hypothyroidism, smoking, anxiety, depression, reflux disease, and obesity, whose history and physical exam are in keeping obstructive sleep apnea (OSA).she would benefit from reevaluation and consideration of treatment again. She is interested in pursuing this as part of a healthy lifestyle and for prevention. I had a long chat with the patient about my findings and the diagnosis of OSA, its prognosis and treatment options. We talked about medical treatments, surgical interventions and non-pharmacological approaches. I explained in particular the risks and ramifications of untreated moderate to severe OSA, especially with respect to developing cardiovascular disease down the Road, including congestive heart failure, difficult to treat hypertension, cardiac arrhythmias, or stroke. Even type 2 diabetes has, in part, been linked to untreated OSA. Symptoms of untreated OSA include  daytime sleepiness, memory problems, mood irritability and mood disorder such as depression and anxiety, lack of energy, as well as recurrent headaches, especially morning headaches. We talked about smoking cessation and trying to maintain a healthy lifestyle in general, as well as the importance of weight control. I encouraged the patient to eat healthy, exercise daily and keep well hydrated, to keep a scheduled bedtime and wake time routine, to not skip any meals and eat healthy snacks in between meals. I advised the patient not to drive when feeling sleepy. I  recommended the following at this time: sleep study with potential positive airway pressure titration. (We will score hypopneas at 3%).   I explained the sleep test procedure to the patient and also outlined possible surgical and non-surgical treatment options of OSA, including the use of a custom-made dental device (which would require a referral to a specialist dentist or oral surgeon), upper airway surgical options, such as pillar implants, radiofrequency surgery, tongue base surgery, and UPPP (which would involve a referral to an ENT surgeon). Rarely, jaw surgery such as mandibular advancement may be considered.  I also explained the CPAP treatment option to the patient, who indicated that she would be willing to try CPAP if the need arises. I explained the importance of being compliant with PAP treatment, not only for insurance purposes but primarily to improve Her symptoms, and for the patient's long term health benefit, including to reduce Her cardiovascular risks. I answered all her questions today and the patient was in agreement. I would like to see her back after the sleep study is completed and encouraged her to call with any interim questions, concerns, problems or updates.   Thank you very much for allowing me to participate in the care of this nice patient. If I can be of any further assistance to you please do not hesitate to call me  at [phone].  Sincerely,   Angela Foley, MD, PhD

## 2017-09-01 ENCOUNTER — Encounter: Payer: No Typology Code available for payment source | Admitting: Family Medicine

## 2017-09-06 ENCOUNTER — Encounter: Payer: Self-pay | Admitting: Family Medicine

## 2017-09-06 ENCOUNTER — Ambulatory Visit (INDEPENDENT_AMBULATORY_CARE_PROVIDER_SITE_OTHER): Payer: No Typology Code available for payment source | Admitting: Family Medicine

## 2017-09-06 VITALS — BP 130/80 | HR 65 | Temp 98.4°F | Ht 66.5 in | Wt 254.0 lb

## 2017-09-06 DIAGNOSIS — F329 Major depressive disorder, single episode, unspecified: Secondary | ICD-10-CM | POA: Diagnosis not present

## 2017-09-06 DIAGNOSIS — I10 Essential (primary) hypertension: Secondary | ICD-10-CM | POA: Diagnosis not present

## 2017-09-06 DIAGNOSIS — Z0001 Encounter for general adult medical examination with abnormal findings: Secondary | ICD-10-CM | POA: Diagnosis not present

## 2017-09-06 DIAGNOSIS — R0681 Apnea, not elsewhere classified: Secondary | ICD-10-CM

## 2017-09-06 DIAGNOSIS — F419 Anxiety disorder, unspecified: Secondary | ICD-10-CM

## 2017-09-06 DIAGNOSIS — Z716 Tobacco abuse counseling: Secondary | ICD-10-CM

## 2017-09-06 DIAGNOSIS — F17200 Nicotine dependence, unspecified, uncomplicated: Secondary | ICD-10-CM | POA: Diagnosis not present

## 2017-09-06 DIAGNOSIS — K760 Fatty (change of) liver, not elsewhere classified: Secondary | ICD-10-CM

## 2017-09-06 DIAGNOSIS — Z6841 Body Mass Index (BMI) 40.0 and over, adult: Secondary | ICD-10-CM | POA: Diagnosis not present

## 2017-09-06 DIAGNOSIS — M170 Bilateral primary osteoarthritis of knee: Secondary | ICD-10-CM

## 2017-09-06 DIAGNOSIS — F1021 Alcohol dependence, in remission: Secondary | ICD-10-CM | POA: Diagnosis not present

## 2017-09-06 HISTORY — DX: Fatty (change of) liver, not elsewhere classified: K76.0

## 2017-09-06 LAB — BASIC METABOLIC PANEL
BUN: 10 mg/dL (ref 6–23)
CALCIUM: 9.5 mg/dL (ref 8.4–10.5)
CO2: 33 meq/L — AB (ref 19–32)
Chloride: 94 mEq/L — ABNORMAL LOW (ref 96–112)
Creatinine, Ser: 0.6 mg/dL (ref 0.40–1.20)
GFR: 113.92 mL/min (ref >60.00)
GLUCOSE: 107 mg/dL — AB (ref 70–99)
Potassium: 3.6 mEq/L (ref 3.5–5.1)
SODIUM: 138 meq/L (ref 135–145)

## 2017-09-06 MED ORDER — VARENICLINE TARTRATE 0.5 MG X 11 & 1 MG X 42 PO MISC: ORAL | 0 refills | Status: DC

## 2017-09-06 MED ORDER — AMLODIPINE BESYLATE 10 MG PO TABS: 10.0000 mg | ORAL_TABLET | Freq: Every day | ORAL | 0 refills | Status: DC

## 2017-09-06 MED ORDER — BUPROPION HCL ER (XL) 150 MG PO TB24: 150.0000 mg | ORAL_TABLET | Freq: Every day | ORAL | 0 refills | Status: DC

## 2017-09-06 MED ORDER — CHLORTHALIDONE 25 MG PO TABS: 25.0000 mg | ORAL_TABLET | Freq: Every day | ORAL | 0 refills | Status: DC

## 2017-09-06 MED FILL — BUPROPION HCL XL 150 MG TAB: 150 | 90 days supply | Qty: 90 | Fill #0

## 2017-09-06 MED FILL — CHLORTHALIDONE 25 MG TAB: 25 | 90 days supply | Qty: 90 | Fill #0

## 2017-09-06 MED FILL — AMLODIPINE BESYLATE 10 MG T: 10 | 90 days supply | Qty: 90 | Fill #0

## 2017-09-06 NOTE — Progress Notes (Addendum)
Subjective:  Patient ID: Angela Bosworthatherine W Duman, female    DOB: July 10, 1970  Age: 47 y.o. MRN: [id number]  CC: Annual Exam   HPI Patient presents for a physical exam in addition to follow-up of several other medical issues.  She is doing well on her new combination of blood pressure medicines to include Norvasc and chlorthalidone.  She is no longer having the cough associated with Zestril.  Swelling in her legs has also reached resolved with addition of the chlorthalidone.  Continues to do well in recovery and is attending meetings.  She is starting to exercise by walking their dog with her husband on a daily basis.  She remains active at work and tends to log well over 10,000 steps just doing her job.  Anxiety and depression is well controlled with the Wellbutrin.  She is aware of her history of steatosis.  She is interested in using Chantix for smoking cessation.  She has taken it in the past without issue.  Outpatient Medications Prior to Visit  Medication Sig Dispense Refill  . amLODipine (NORVASC) 10 MG tablet Take 1 tablet (10 mg total) by mouth daily. 90 tablet 0  . buPROPion (WELLBUTRIN XL) 150 MG 24 hr tablet Take 1 tablet (150 mg total) by mouth daily. 90 tablet 0  . chlorthalidone (HYGROTON) 25 MG tablet Take 1 tablet (25 mg total) by mouth daily. 100 tablet 0   No facility-administered medications prior to visit.     ROS Review of Systems  Constitutional: Negative for chills, fatigue, fever and unexpected weight change.  HENT: Negative.   Eyes: Negative.   Respiratory: Negative.   Cardiovascular: Negative.   Gastrointestinal: Negative.   Endocrine: Negative for polyphagia and polyuria.  Genitourinary: Negative.   Musculoskeletal: Negative for gait problem and joint swelling.  Skin: Negative for pallor.  Allergic/Immunologic: Negative for immunocompromised state.  Neurological: Negative for weakness and headaches.  Hematological: Does not bruise/bleed easily.    Psychiatric/Behavioral: Negative.     Objective:  BP 130/80   Pulse 65   Temp 98.4 F (36.9 C)   Ht 5' 6.5" (1.689 m)   Wt 254 lb (115.2 kg)   SpO2 96%   BMI 40.38 kg/m   BP Readings from Last 3 Encounters:  01/20/18 112/82  01/12/18 130/78  01/05/18 126/79    Wt Readings from Last 3 Encounters:  01/20/18 246 lb (111.6 kg)  01/12/18 246 lb (111.6 kg)  01/05/18 245 lb (111.1 kg)    Physical Exam  Constitutional: She is oriented to person, place, and time. She appears well-developed and well-nourished. No distress.  HENT:  Head: Normocephalic and atraumatic.  Right Ear: External ear normal.  Left Ear: External ear normal.  Nose: Septal deviation present.  Mouth/Throat: Oropharynx is clear and moist. No oropharyngeal exudate.  Eyes: Pupils are equal, round, and reactive to light. Conjunctivae and EOM are normal. Right eye exhibits no discharge. Left eye exhibits no discharge. No scleral icterus.  Neck: Normal range of motion. Neck supple. No JVD present. No tracheal deviation present. No thyromegaly present.  Cardiovascular: Normal rate, regular rhythm and normal heart sounds.  Pulmonary/Chest: Effort normal and breath sounds normal.  Abdominal: Bowel sounds are normal.  Musculoskeletal: She exhibits edema.  Lymphadenopathy:    She has no cervical adenopathy.  Neurological: She is alert and oriented to person, place, and time.  Skin: Capillary refill takes less than 2 seconds. She is not diaphoretic. No erythema. No pallor.  Psychiatric: She has a normal mood  and affect. Her behavior is normal.    Lab Results  Component Value Date   WBC 7.1 10/18/2017   HGB 15.0 10/18/2017   HCT 46.3 10/18/2017   PLT 218.0 05/28/2017   GLUCOSE 70 10/18/2017   CHOL 233 (H) 10/18/2017   TRIG 253 (H) 10/18/2017   HDL 53 10/18/2017   LDLDIRECT 108.0 05/28/2017   LDLCALC 129 (H) 10/18/2017   ALT 34 (H) 10/18/2017   AST 35 10/18/2017   NA 138 10/18/2017   K 3.9 10/18/2017   CL  92 (L) 10/18/2017   CREATININE 0.60 10/18/2017   BUN 12 10/18/2017   CO2 29 10/18/2017   TSH 2.230 10/18/2017   INR 1.08 08/17/2011   HGBA1C 5.3 10/18/2017    Dg Knee Complete 4 Views Left  Result Date: 09/05/2015 CLINICAL DATA:  Chronic pain, no known injury, initial encounter EXAM: LEFT KNEE - COMPLETE 4+ VIEW COMPARISON:  None. FINDINGS: No acute fracture or dislocation is noted. No joint effusion is seen. Minimal osteophytic changes are noted medially and laterally. IMPRESSION: Minimal degenerative change.  No acute bony abnormality. Electronically Signed   By: Alcide Clever M.D.   On: 09/05/2015 15:48    Assessment & Plan:   Marcile was seen today for annual exam.  Diagnoses and all orders for this visit:  Essential hypertension -     amLODipine (NORVASC) 10 MG tablet; Take 1 tablet (10 mg total) by mouth daily. -     chlorthalidone (HYGROTON) 25 MG tablet; Take 1 tablet (25 mg total) by mouth daily. -     Basic metabolic panel  Encounter for smoking cessation counseling -     varenicline (CHANTIX PAK) 0.5 MG X 11 & 1 MG X 42 tablet; Take one 0.5 mg tablet by mouth once daily for 3 days, then increase to one 0.5 mg tablet twice daily for 4 days, then increase to one 1 mg tablet twice daily.  Recovering alcoholic (HCC)  Apnea  Class 3 severe obesity due to excess calories without serious comorbidity with body mass index (BMI) of 40.0 to 44.9 in adult [facility])  Tobacco use disorder -     buPROPion (WELLBUTRIN XL) 150 MG 24 hr tablet; Take 1 tablet (150 mg total) by mouth daily. -     varenicline (CHANTIX PAK) 0.5 MG X 11 & 1 MG X 42 tablet; Take one 0.5 mg tablet by mouth once daily for 3 days, then increase to one 0.5 mg tablet twice daily for 4 days, then increase to one 1 mg tablet twice daily.  Anxiety and depression -     buPROPion (WELLBUTRIN XL) 150 MG 24 hr tablet; Take 1 tablet (150 mg total) by mouth daily.  Class 3 severe obesity due to excess calories with body  mass index (BMI) of 40.0 to 44.9 in adult, unspecified whether serious comorbidity present (HCC) -     buPROPion (WELLBUTRIN XL) 150 MG 24 hr tablet; Take 1 tablet (150 mg total) by mouth daily.  Encounter for health maintenance examination with abnormal findings  Steatosis of liver  Primary osteoarthritis of both knees -     Ambulatory referral to Orthopedic Surgery   I am having Angela Stark start on varenicline. I am also having her maintain her amLODipine, buPROPion, and chlorthalidone.  Meds ordered this encounter  Medications  . amLODipine (NORVASC) 10 MG tablet    Sig: Take 1 tablet (10 mg total) by mouth daily.    Dispense:  90 tablet  Refill:  0  . buPROPion (WELLBUTRIN XL) 150 MG 24 hr tablet    Sig: Take 1 tablet (150 mg total) by mouth daily.    Dispense:  90 tablet    Refill:  0  . chlorthalidone (HYGROTON) 25 MG tablet    Sig: Take 1 tablet (25 mg total) by mouth daily.    Dispense:  100 tablet    Refill:  0  . varenicline (CHANTIX PAK) 0.5 MG X 11 & 1 MG X 42 tablet    Sig: Take one 0.5 mg tablet by mouth once daily for 3 days, then increase to one 0.5 mg tablet twice daily for 4 days, then increase to one 1 mg tablet twice daily.    Dispense:  53 tablet    Refill:  0   Blood pressure is controlled we will continue current therapy.  Patient is in recovery and attending meetings.  She is scheduled for a sleep study soon.  Anxiety and depression is controlled with the Wellbutrin.  We discussed her history of steatosis.  Like the most likely cause of this at this point is the patient's obesity.  Reassured her that with significant weight loss steatosis can potentially resolved.  Will be monitoring her LFTs.  Patient is aware of the significance of stopping smoking.  Discussed using Chantix and its possible side effects to include bad dreams, depression homicidal thoughts.  Patient is perimenopausal and not menopausal.  Advised her that she still may become  pregnant and to take appropriate precautions for preventing that.  She is following up with her GYN provider soon for Pap smear.  She will follow-up with me in 1 month.  Encouraged her continual weight loss efforts and to increase her level physical activity.  Follow-up: Return in about 1 month (around 10/06/2017).  Mliss Sax, MD

## 2017-09-06 NOTE — Patient Instructions (Signed)
Varenicline oral tablets What is this medicine? VARENICLINE (var EN i kleen) is used to help people quit smoking. It can reduce the symptoms caused by stopping smoking. It is used with a patient support program recommended by your physician. This medicine may be used for other purposes; ask your health care provider or pharmacist if you have questions. COMMON BRAND NAME(S): Chantix What should I tell my health care provider before I take this medicine? They need to know if you have any of these conditions: -bipolar disorder, depression, schizophrenia or other mental illness -heart disease -if you often drink alcohol -kidney disease -peripheral vascular disease -seizures -stroke -suicidal thoughts, plans, or attempt; a previous suicide attempt by you or a family member -an unusual or allergic reaction to varenicline, other medicines, foods, dyes, or preservatives -pregnant or trying to get pregnant -breast-feeding How should I use this medicine? Take this medicine by mouth after eating. Take with a full glass of water. Follow the directions on the prescription label. Take your doses at regular intervals. Do not take your medicine more often than directed. There are 3 ways you can use this medicine to help you quit smoking; talk to your health care professional to decide which plan is right for you: 1) you can choose a quit date and start this medicine 1 week before the quit date, or, 2) you can start taking this medicine before you choose a quit date, and then pick a quit date between day 8 and 35 days of treatment, or, 3) if you are not sure that you are able or willing to quit smoking right away, start taking this medicine and slowly decrease the amount you smoke as directed by your health care professional with the goal of being cigarette-free by week 12 of treatment. Stick to your plan; ask about support groups or other ways to help you remain cigarette-free. If you are motivated to quit  smoking and did not succeed during a previous attempt with this medicine for reasons other than side effects, or if you returned to smoking after this treatment, speak with your health care professional about whether another course of this medicine may be right for you. A special MedGuide will be given to you by the pharmacist with each prescription and refill. Be sure to read this information carefully each time. Talk to your pediatrician regarding the use of this medicine in children. This medicine is not approved for use in children. Overdosage: If you think you have taken too much of this medicine contact a poison control center or emergency room at once. NOTE: This medicine is only for you. Do not share this medicine with others. What if I miss a dose? If you miss a dose, take it as soon as you can. If it is almost time for your next dose, take only that dose. Do not take double or extra doses. What may interact with this medicine? -alcohol or any product that contains alcohol -insulin -other stop smoking aids -theophylline -warfarin This list may not describe all possible interactions. Give your health care provider a list of all the medicines, herbs, non-prescription drugs, or dietary supplements you use. Also tell them if you smoke, drink alcohol, or use illegal drugs. Some items may interact with your medicine. What should I watch for while using this medicine? Visit your doctor or health care professional for regular check ups. Ask for ongoing advice and encouragement from your doctor or healthcare professional, friends, and family to help you quit. If   you smoke while on this medication, quit again Your mouth may get dry. Chewing sugarless gum or sucking hard candy, and drinking plenty of water may help. Contact your doctor if the problem does not go away or is severe. You may get drowsy or dizzy. Do not drive, use machinery, or do anything that needs mental alertness until you know how  this medicine affects you. Do not stand or sit up quickly, especially if you are an older patient. This reduces the risk of dizzy or fainting spells. Sleepwalking can happen during treatment with this medicine, and can sometimes lead to behavior that is harmful to you, other people, or property. Stop taking this medicine and tell your doctor if you start sleepwalking or have other unusual sleep-related activity. Decrease the amount of alcoholic beverages that you drink during treatment with this medicine until you know if this medicine affects your ability to tolerate alcohol. Some people have experienced increased drunkenness (intoxication), unusual or sometimes aggressive behavior, or no memory of things that have happened (amnesia) during treatment with this medicine. The use of this medicine may increase the chance of suicidal thoughts or actions. Pay special attention to how you are responding while on this medicine. Any worsening of mood, or thoughts of suicide or dying should be reported to your health care professional right away. What side effects may I notice from receiving this medicine? Side effects that you should report to your doctor or health care professional as soon as possible: -allergic reactions like skin rash, itching or hives, swelling of the face, lips, tongue, or throat -acting aggressive, being angry or violent, or acting on dangerous impulses -breathing problems -changes in vision -chest pain or chest tightness -confusion, trouble speaking or understanding -new or worsening depression, anxiety, or panic attacks -extreme increase in activity and talking (mania) -fast, irregular heartbeat -feeling faint or lightheaded, falls -fever -pain in legs when walking -problems with balance, talking, walking -redness, blistering, peeling or loosening of the skin, including inside the mouth -ringing in ears -seeing or hearing things that aren't there  (hallucinations) -seizures -sleepwalking -sudden numbness or weakness of the face, arm or leg -thoughts about suicide or dying, or attempts to commit suicide -trouble passing urine or change in the amount of urine -unusual bleeding or bruising -unusually weak or tired Side effects that usually do not require medical attention (report to your doctor or health care professional if they continue or are bothersome): -constipation -headache -nausea, vomiting -strange dreams -stomach gas -trouble sleeping This list may not describe all possible side effects. Call your doctor for medical advice about side effects. You may report side effects to FDA at 1-800-FDA-1088. Where should I keep my medicine? Keep out of the reach of children. Store at room temperature between 15 and 30 degrees C (59 and 86 degrees F). Throw away any unused medicine after the expiration date. NOTE: This sheet is a summary. It may not cover all possible information. If you have questions about this medicine, talk to your doctor, pharmacist, or health care provider.  2018 Elsevier/Gold Standard (2014-11-22 16:14:23)  

## 2017-09-30 ENCOUNTER — Telehealth: Payer: Self-pay

## 2017-09-30 DIAGNOSIS — Z6841 Body Mass Index (BMI) 40.0 and over, adult: Principal | ICD-10-CM

## 2017-09-30 NOTE — Telephone Encounter (Signed)
Referral entered. I left a voicemail for patient letting her know the referral has been entered.   Copied from CRM [id number]. Topic: Referral - Request >> Sep 30, 2017  3:46 PM Windy KalataMichael, Taylor L, NT wrote: Reason for CRM: patient is calling and states she would like a [facility]. She states her insurance with [employer] requires a referral. She has a appointment on 10/14/17.

## 2017-10-14 ENCOUNTER — Encounter (INDEPENDENT_AMBULATORY_CARE_PROVIDER_SITE_OTHER): Payer: No Typology Code available for payment source

## 2017-10-18 ENCOUNTER — Ambulatory Visit (INDEPENDENT_AMBULATORY_CARE_PROVIDER_SITE_OTHER): Payer: No Typology Code available for payment source | Admitting: Family Medicine

## 2017-10-18 ENCOUNTER — Telehealth: Payer: Self-pay

## 2017-10-18 ENCOUNTER — Encounter (INDEPENDENT_AMBULATORY_CARE_PROVIDER_SITE_OTHER): Payer: Self-pay | Admitting: Family Medicine

## 2017-10-18 VITALS — Ht 67.0 in | Wt 252.0 lb

## 2017-10-18 DIAGNOSIS — Z1331 Encounter for screening for depression: Secondary | ICD-10-CM | POA: Diagnosis not present

## 2017-10-18 DIAGNOSIS — R0602 Shortness of breath: Secondary | ICD-10-CM | POA: Diagnosis not present

## 2017-10-18 DIAGNOSIS — M25561 Pain in right knee: Secondary | ICD-10-CM

## 2017-10-18 DIAGNOSIS — R5383 Other fatigue: Secondary | ICD-10-CM | POA: Diagnosis not present

## 2017-10-18 DIAGNOSIS — Z0289 Encounter for other administrative examinations: Secondary | ICD-10-CM

## 2017-10-18 DIAGNOSIS — Z6839 Body mass index (BMI) 39.0-39.9, adult: Secondary | ICD-10-CM

## 2017-10-18 DIAGNOSIS — E66812 Obesity, class 2: Secondary | ICD-10-CM

## 2017-10-18 DIAGNOSIS — I10 Essential (primary) hypertension: Secondary | ICD-10-CM | POA: Diagnosis not present

## 2017-10-18 DIAGNOSIS — Z9189 Other specified personal risk factors, not elsewhere classified: Secondary | ICD-10-CM

## 2017-10-18 HISTORY — DX: Other fatigue: R53.83

## 2017-10-18 NOTE — Telephone Encounter (Signed)
Copied from CRM [phone]. Topic: Quick Communication - See Telephone Encounter >> Oct 18, 2017 12:17 PM [name], [name] wrote: CRM for notification. See Telephone encounter for: 10/18/17. Pt states R knee pain is causing difficulty walking. She is requesting referral [facility]. Pt has [facility]. Pt hoping for appt tomorrow. Is Dr. [name] an option? If so maybe referral to Dr. [name] at either office so pt can be seen 10/19/17 when she is off work.

## 2017-10-18 NOTE — Telephone Encounter (Signed)
I called and left patient a voicemail. Referral for Dr. [name] has been entered. I advised patient to call the office back to schedule her appointment; he has plenty of availability tomorrow.

## 2017-10-19 ENCOUNTER — Ambulatory Visit (INDEPENDENT_AMBULATORY_CARE_PROVIDER_SITE_OTHER): Payer: No Typology Code available for payment source

## 2017-10-19 ENCOUNTER — Encounter: Payer: Self-pay | Admitting: Family Medicine

## 2017-10-19 ENCOUNTER — Ambulatory Visit (INDEPENDENT_AMBULATORY_CARE_PROVIDER_SITE_OTHER): Payer: No Typology Code available for payment source | Admitting: Family Medicine

## 2017-10-19 VITALS — BP 138/84 | HR 68 | Ht 67.0 in | Wt 252.0 lb

## 2017-10-19 DIAGNOSIS — M25561 Pain in right knee: Secondary | ICD-10-CM

## 2017-10-19 DIAGNOSIS — M25562 Pain in left knee: Secondary | ICD-10-CM

## 2017-10-19 HISTORY — DX: Pain in left knee: M25.562

## 2017-10-19 HISTORY — DX: Pain in right knee: M25.561

## 2017-10-19 LAB — CBC WITH DIFFERENTIAL
BASOS ABS: 0 10*3/uL (ref 0.0–0.2)
Basos: 0 %
EOS (ABSOLUTE): 0.1 10*3/uL (ref 0.0–0.4)
Eos: 1 %
HEMOGLOBIN: 15 g/dL (ref 11.1–15.9)
Hematocrit: 46.3 % (ref 34.0–46.6)
IMMATURE GRANS (ABS): 0 10*3/uL (ref 0.0–0.1)
Immature Granulocytes: 0 %
LYMPHS: 30 %
Lymphocytes Absolute: 2.1 10*3/uL (ref 0.7–3.1)
MCH: 31.9 pg (ref 26.6–33.0)
MCHC: 32.4 g/dL (ref 31.5–35.7)
MCV: 99 fL — ABNORMAL HIGH (ref 79–97)
MONOCYTES: 5 %
Monocytes Absolute: 0.4 10*3/uL (ref 0.1–0.9)
NEUTROS PCT: 64 %
Neutrophils Absolute: 4.5 10*3/uL (ref 1.4–7.0)
RBC: 4.7 x10E6/uL (ref 3.77–5.28)
RDW: 13.9 % (ref 12.3–15.4)
WBC: 7.1 10*3/uL (ref 3.4–10.8)

## 2017-10-19 LAB — LIPID PANEL WITH LDL/HDL RATIO
CHOLESTEROL TOTAL: 233 mg/dL — AB (ref 100–199)
HDL: 53 mg/dL (ref >39)
LDL CALC: 129 mg/dL — AB (ref 0–99)
LDl/HDL Ratio: 2.4 ratio (ref 0.0–3.2)
TRIGLYCERIDES: 253 mg/dL — AB (ref 0–149)
VLDL CHOLESTEROL CAL: 51 mg/dL — AB (ref 5–40)

## 2017-10-19 LAB — COMPREHENSIVE METABOLIC PANEL
ALBUMIN: 4.6 g/dL (ref 3.5–5.5)
ALT: 34 IU/L — AB (ref 0–32)
AST: 35 IU/L (ref 0–40)
Albumin/Globulin Ratio: 1.7 (ref 1.2–2.2)
Alkaline Phosphatase: 55 IU/L (ref 39–117)
BUN/Creatinine Ratio: 20 (ref 9–23)
BUN: 12 mg/dL (ref 6–24)
Bilirubin Total: 0.4 mg/dL (ref 0.0–1.2)
CALCIUM: 9.1 mg/dL (ref 8.7–10.2)
CHLORIDE: 92 mmol/L — AB (ref 96–106)
CO2: 29 mmol/L (ref 20–29)
Creatinine, Ser: 0.6 mg/dL (ref 0.57–1.00)
GFR, EST AFRICAN AMERICAN: 126 mL/min/{1.73_m2} (ref >59)
GFR, EST NON AFRICAN AMERICAN: 109 mL/min/{1.73_m2} (ref >59)
GLUCOSE: 70 mg/dL (ref 65–99)
Globulin, Total: 2.7 g/dL (ref 1.5–4.5)
Potassium: 3.9 mmol/L (ref 3.5–5.2)
Sodium: 138 mmol/L (ref 134–144)
TOTAL PROTEIN: 7.3 g/dL (ref 6.0–8.5)

## 2017-10-19 LAB — INSULIN, RANDOM: INSULIN: 4.2 u[IU]/mL (ref 2.6–24.9)

## 2017-10-19 LAB — T3: T3, Total: 105 ng/dL (ref 71–180)

## 2017-10-19 LAB — T4, FREE: Free T4: 1.14 ng/dL (ref 0.82–1.77)

## 2017-10-19 LAB — HEMOGLOBIN A1C
Est. average glucose Bld gHb Est-mCnc: 105 mg/dL
Hgb A1c MFr Bld: 5.3 % (ref 4.8–5.6)

## 2017-10-19 LAB — VITAMIN D 25 HYDROXY (VIT D DEFICIENCY, FRACTURES): Vit D, 25-Hydroxy: 18 ng/mL — ABNORMAL LOW (ref 30.0–100.0)

## 2017-10-19 LAB — TSH: TSH: 2.23 u[IU]/mL (ref 0.450–4.500)

## 2017-10-19 LAB — FOLATE: Folate: 3.5 ng/mL (ref >3.0)

## 2017-10-19 LAB — VITAMIN B12: Vitamin B-12: 283 pg/mL (ref 232–1245)

## 2017-10-19 MED ORDER — DICLOFENAC SODIUM 2 % TD SOLN: 1.0000 "application " | Freq: Two times a day (BID) | TRANSDERMAL | 3 refills | Status: DC

## 2017-10-19 NOTE — Progress Notes (Signed)
Angela Stark - 47 y.o. female MRN [id number]  Date of birth: [birth year]  SUBJECTIVE:  Including CC & ROS.  Chief Complaint  Patient presents with  . Knee Pain    Angela Stark is a 47 y.o. female that is here today for right and left knee pain. Ongoing for three weeks. Pain is worse in right than left. Located at the anterior and medial aspect of her right knee. Admits to minimal swelling. Denies tingling or numbness in her feet. Pain is constants, worse with flexion. Denies injury or trama. She is a Psychologist, sport and exercise and stands daily. She has been applying ice and taking motrin for the pain. Pain is worse at the end of the day. Has been taking tylenol and ibuprofen with some improvement. Denies any inciting event or prior injury.   Independent review of the left knee x-ray from 2017 shows mild medial joint space narrowing.   Review of Systems  Constitutional: Negative for fever.  HENT: Negative for congestion.   Respiratory: Negative for cough.   Cardiovascular: Negative for chest pain.  Gastrointestinal: Negative for abdominal pain.  Musculoskeletal: Positive for gait problem.  Skin: Negative for color change.  Neurological: Negative for weakness.  Hematological: Negative for adenopathy.  Psychiatric/Behavioral: Negative for agitation.    HISTORY: Past Medical, Surgical, Social, and Family History Reviewed & Updated per EMR.   Pertinent Historical Findings include:  Past Medical History:  Diagnosis Date  . Anemia   . Anxiety   . Back pain   . Depression   . Fatty liver   . GERD (gastroesophageal reflux disease)   . Heart murmur    When pregnant  . Hyperlipidemia   . Hypertension   . Joint pain   . Knee pain   . Muscle stiffness   . Phlebitis   . Shortness of breath   . Shortness of breath on exertion   . Sleep apnea   . Sleep apnea   . Stress   . Swelling of both lower extremities   . Trouble in sleeping     Past Surgical History:  Procedure Laterality  Date  . OVARY SURGERY      No Known Allergies  Family History  Problem Relation Age of Onset  . Hypertension Mother   . Hyperlipidemia Mother   . Diabetes Mother   . Emphysema Mother   . Obesity Mother   . Heart disease Father   . Hypertension Father   . Hyperlipidemia Father   . Obesity Father   . Sleep apnea Father   . Alcoholism Father   . Stroke Brother   . Lung cancer Maternal Grandmother      Social History   Socioeconomic History  . Marital status: Married    Spouse name: Onalee Hua  . Number of children: 3  . Years of education: 40  . Highest education level: Not on file  Occupational History  . Occupation: Scientist, research (medical): [employer]  Social Needs  . Financial resource strain: Not on file  . Food insecurity:    Worry: Not on file    Inability: Not on file  . Transportation needs:    Medical: Not on file    Non-medical: Not on file  Tobacco Use  . Smoking status: Current Every Day Smoker    Packs/day: 0.50    Years: 20.00    Pack years: 10.00    Types: Cigarettes  . Smokeless tobacco: Never Used  Substance and Sexual Activity  .  Alcohol use: No  . Drug use: No  . Sexual activity: Yes    Birth control/protection: None  Lifestyle  . Physical activity:    Days per week: Not on file    Minutes per session: Not on file  . Stress: Not on file  Relationships  . Social connections:    Talks on phone: Not on file    Gets together: Not on file    Attends religious service: Not on file    Active member of club or organization: Not on file    Attends meetings of clubs or organizations: Not on file    Relationship status: Not on file  . Intimate partner violence:    Fear of current or ex partner: Not on file    Emotionally abused: Not on file    Physically abused: Not on file    Forced sexual activity: Not on file  Other Topics Concern  . Not on file  Social History Narrative   Fun: Fishing, camping   Denies religious beliefs effecting health care.       PHYSICAL EXAM:  VS: BP 138/84 (BP Location: Left Arm, Patient Position: Sitting, Cuff Size: Normal)   Pulse 68   Ht 5\' 7"  (1.702 m)   Wt 252 lb (114.3 kg)   SpO2 98%   BMI 39.47 kg/m  Physical Exam Gen: NAD, alert, cooperative with exam, well-appearing ENT: normal lips, normal nasal mucosa,  Eye: normal EOM, normal conjunctiva and lids CV:  no edema, +2 pedal pulses   Resp: no accessory muscle use, non-labored,  Skin: no rashes, no areas of induration  Neuro: normal tone, normal sensation to touch Psych:  normal insight, alert and oriented MSK:  Right and left Knee: Normal to inspection with no erythema or effusion or obvious bony abnormalities. Palpation normal with no warmth, Mild medial joint line tenderness on right  Mild medial patellar pain on right  Mild medial joint line tenderness on left.  ROM full in flexion and extension and lower leg rotation. Ligaments with solid consistent endpoints including  LCL, MCL. Negative Mcmurray's. Non painful patellar compression. Patellar glide without crepitus. Patellar and quadriceps tendons unremarkable. Hamstring and quadriceps strength is normal.  Neurovascularly intact   Limited ultrasound: right and left knee:  Right knee:  Mild effusion  Normal appearing QT and PT  Normal appearing lateral meniscus  Mild medial joint space narrowing.   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 No inciting event. Walks a lot at work  - pennsaid  - vimovo samples. Can order more if it works  - counseled on HEP  - counseled on ice and compression  - if no improvement consider injection   Acute pain of left knee Similar to right knee with small effusion. Previous xray showed degenerative  changes and observed again on US today. 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  - pennsaid  - vimovo samples  - counseled on HEP - counseled on ice and compression  - if no improvement consider injection.

## 2017-10-19 NOTE — Assessment & Plan Note (Signed)
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 No inciting event. Walks a lot at work  - pennsaid  - vimovo samples. Can order more if it works  - counseled on HEP  - counseled on ice and compression  - if no improvement consider injection

## 2017-10-19 NOTE — Assessment & Plan Note (Signed)
Similar to right knee with small effusion. Previous xray showed degenerative changes and observed again on US today. 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  - pennsaid  - vimovo samples  - counseled on HEP - counseled on ice and compression  - if no improvement consider injection.

## 2017-10-19 NOTE — Patient Instructions (Signed)
Nice to meet you  Please try the exercises  Please try the pennsaid  Please try compression and icing  Please try the vimovo samples and let me know if you want more  Please follow up before you trip if you would like to try injections.

## 2017-10-20 NOTE — Progress Notes (Signed)
.  Office: [phone]  /  Fax: [phone]   HPI:   Chief Complaint: OBESITY  Angela Stark (MR# [id number]) is a 47 y.o. female who presents on 10/18/2017 for obesity evaluation and treatment. Current BMI is Body mass index is 39.47 kg/m.Angela Stark has struggled with obesity for years and has been unsuccessful in either losing weight or maintaining long term weight loss. Angela Stark attended our information session and states she is currently in the action stage of change and ready to dedicate time achieving and maintaining a healthier weight.  Angela Stark heard about our clinic from insurance incentive. Skipping breakfast daily.   Angela Stark states her family eats meals together she thinks her family will eat healthier with  her her desired weight loss is 102 lbs she has been heavy most of  her life she started gaining weight at 47 years old her heaviest weight ever was 252 lbs she skips meals frequently she is frequently drinking liquids with calories she frequently makes poor food choices she frequently eats larger portions than normal  she struggles with emotional eating    Fatigue Angela Stark feels her energy is lower than it should be. This has worsened with weight gain and has not worsened recently. Angela Stark admits to daytime somnolence and  admits to waking up still tired. Patient is at risk for obstructive sleep apnea. Patent has a history of symptoms of daytime fatigue. Patient generally gets 5 hours of sleep per night, and states they generally have nightime awakenings. Snoring is present. Apneic episodes are not present. Epworth Sleepiness Score is 6.  Dyspnea on exertion Angela Stark notes increasing shortness of breath with exercising and seems to be worsening over time with weight gain. She notes getting out of breath sooner with activity than she used to. This has not gotten worse recently. EKG-prolonged PR interval. [name] denies  orthopnea.  Hypertension Angela Stark is a 47 y.o. female with hypertension. Angela Stark's blood pressure is elevated today. She denies chest pain, chest pressure, or headache. She notes she is taking medications regularly. She is working weight loss to help control her blood pressure with the goal of decreasing her risk of heart attack and stroke. Angela Stark's blood pressure is not currently controlled.  At risk for cardiovascular disease Angela Stark is at a higher than average risk for cardiovascular disease due to obesity and hypertension. She currently denies any chest pain.  Depression Screen Angela Stark's Food and Mood (modified PHQ-9) score was  Depression screen PHQ 2/9 10/18/2017  Decreased Interest 3  Down, Depressed, Hopeless 3  PHQ - 2 Score 6  Altered sleeping 3  Tired, decreased energy 3  Change in appetite 3  Feeling bad or failure about yourself  3  Trouble concentrating 0  Moving slowly or fidgety/restless 0  Suicidal thoughts 1  PHQ-9 Score 19    ALLERGIES: No Known Allergies  MEDICATIONS: Current Outpatient Medications on File Prior to Visit  Medication Sig Dispense Refill  . amLODipine (NORVASC) 10 MG tablet Take 1 tablet (10 mg total) by mouth daily. 90 tablet 0  . buPROPion (WELLBUTRIN XL) 150 MG 24 hr tablet Take 1 tablet (150 mg total) by mouth daily. 90 tablet 0  . chlorthalidone (HYGROTON) 25 MG tablet Take 1 tablet (25 mg total) by mouth daily. 100 tablet 0  . varenicline (CHANTIX PAK) 0.5 MG X 11 & 1 MG X 42 tablet Take one 0.5 mg tablet by mouth once daily for 3 days, then increase to one 0.5 mg tablet twice daily  for 4 days, then increase to one 1 mg tablet twice daily. (Patient not taking: Reported on 10/19/2017) 53 tablet 0   No current facility-administered medications on file prior to visit.     PAST MEDICAL HISTORY: Past Medical History:  Diagnosis Date  . Anemia   . Anxiety   . Back pain   . Depression   . Fatty liver   . GERD  (gastroesophageal reflux disease)   . Heart murmur    When pregnant  . Hyperlipidemia   . Hypertension   . Joint pain   . Knee pain   . Muscle stiffness   . Phlebitis   . Shortness of breath   . Shortness of breath on exertion   . Sleep apnea   . Sleep apnea   . Stress   . Swelling of both lower extremities   . Trouble in sleeping     PAST SURGICAL HISTORY: Past Surgical History:  Procedure Laterality Date  . OVARY SURGERY      SOCIAL HISTORY: Social History   Tobacco Use  . Smoking status: Current Every Day Smoker    Packs/day: 0.50    Years: 20.00    Pack years: 10.00    Types: Cigarettes  . Smokeless tobacco: Never Used  Substance Use Topics  . Alcohol use: No  . Drug use: No    FAMILY HISTORY: Family History  Problem Relation Age of Onset  . Hypertension Mother   . Hyperlipidemia Mother   . Diabetes Mother   . Emphysema Mother   . Obesity Mother   . Heart disease Father   . Hypertension Father   . Hyperlipidemia Father   . Obesity Father   . Sleep apnea Father   . Alcoholism Father   . Stroke Brother   . Lung cancer Maternal Grandmother     ROS: Review of Systems  Constitutional: Positive for malaise/fatigue. Negative for weight loss.       + Trouble sleeping  HENT:       + Nasal stuffiness  Eyes:       + Vision changes + Floaters  Respiratory: Positive for shortness of breath (with exertion ).   Cardiovascular: Negative for chest pain and orthopnea.       Negative chest pressure  Musculoskeletal: Positive for back pain.       + Muscle or joint pain + Muscle stiffness + Red or swollen joints  Neurological: Negative for headaches.  Psychiatric/Behavioral: Positive for depression. Negative for suicidal ideas.       + Stress    PHYSICAL EXAM: Height 5\' 7"  (1.702 m), weight 252 lb (114.3 kg). Body mass index is 39.47 kg/m. Physical Exam  Constitutional: She is oriented to person, place, and time. She appears well-developed and  well-nourished.  HENT:  Head: Normocephalic and atraumatic.  Nose: Nose normal.  Eyes: EOM are normal. No scleral icterus.  Neck: Normal range of motion. Neck supple. No thyromegaly present.  Cardiovascular: Normal rate and regular rhythm.  Pulmonary/Chest: Effort normal. No respiratory distress.  Abdominal: Soft. There is no tenderness.  + Obesity  Musculoskeletal:  Range of Motion normal in all 4 extremities Trace edema noted in bilateral lower extremities  Neurological: She is alert and oriented to person, place, and time. Coordination normal.  Skin: Skin is warm and dry.  Psychiatric: She has a normal mood and affect. Her behavior is normal.  Vitals reviewed.   RECENT LABS AND TESTS: BMET    Component Value Date/Time  NA 138 10/18/2017 1141   K 3.9 10/18/2017 1141   CL 92 (L) 10/18/2017 1141   CO2 29 10/18/2017 1141   GLUCOSE 70 10/18/2017 1141   GLUCOSE 107 (H) 09/06/2017 0919   BUN 12 10/18/2017 1141   CREATININE 0.60 10/18/2017 1141   CALCIUM 9.1 10/18/2017 1141   GFRNONAA 109 10/18/2017 1141   GFRAA 126 10/18/2017 1141   Lab Results  Component Value Date   HGBA1C 5.3 10/18/2017   Lab Results  Component Value Date   INSULIN 4.2 10/18/2017   CBC    Component Value Date/Time   WBC 7.1 10/18/2017 1141   WBC 5.3 05/28/2017 1144   RBC 4.70 10/18/2017 1141   RBC 4.66 05/28/2017 1144   HGB 15.0 10/18/2017 1141   HCT 46.3 10/18/2017 1141   PLT 218.0 05/28/2017 1144   MCV 99 (H) 10/18/2017 1141   MCH 31.9 10/18/2017 1141   MCH 35.6 (H) 08/20/2011 0445   MCHC 32.4 10/18/2017 1141   MCHC 33.6 05/28/2017 1144   RDW 13.9 10/18/2017 1141   LYMPHSABS 2.1 10/18/2017 1141   MONOABS 0.4 08/17/2011 2140   EOSABS 0.1 10/18/2017 1141   BASOSABS 0.0 10/18/2017 1141   Iron/TIBC/Ferritin/ %Sat No results found for: IRON, TIBC, FERRITIN, IRONPCTSAT Lipid Panel     Component Value Date/Time   CHOL 233 (H) 10/18/2017 1141   TRIG 253 (H) 10/18/2017 1141   HDL 53  10/18/2017 1141   CHOLHDL 4 05/28/2017 1144   VLDL 50.0 (H) 05/28/2017 1144   LDLCALC 129 (H) 10/18/2017 1141   LDLDIRECT 108.0 05/28/2017 1144   Hepatic Function Panel     Component Value Date/Time   PROT 7.3 10/18/2017 1141   ALBUMIN 4.6 10/18/2017 1141   AST 35 10/18/2017 1141   ALT 34 (H) 10/18/2017 1141   ALKPHOS 55 10/18/2017 1141   BILITOT 0.4 10/18/2017 1141   BILIDIR 0.4 (H) 07/30/2015 0957      Component Value Date/Time   TSH 2.230 10/18/2017 1141   Vitamin D No recent labs  ECG  shows NSR with a rate of 63 BPM INDIRECT CALORIMETER done today shows a VO2 of 340 and a REE of 2365. Her calculated basal metabolic rate is [id number] thus her basal metabolic rate is better than expected.    ASSESSMENT AND PLAN: Other fatigue - Plan: EKG 12-Lead, Vitamin B12, CBC With Differential, Folate, Hemoglobin A1c, Insulin, random, Lipid Panel With LDL/HDL Ratio, T3, T4, free, TSH, VITAMIN D 25 Hydroxy (Vit-D Deficiency, Fractures)  Shortness of breath on exertion - Plan: CBC With Differential  Essential hypertension - Plan: Comprehensive metabolic panel  Depression screening  At risk for heart disease  Class 2 severe obesity with serious comorbidity and body mass index (BMI) of 39.0 to 39.9 in adult, unspecified obesity type (HCC)  PLAN:  Fatigue Angela EvansCatherine was informed that her fatigue may be related to obesity, depression or many other causes. Labs will be ordered, and in the meanwhile Angela EvansCatherine has agreed to work on diet, exercise and weight loss to help with fatigue. Proper sleep hygiene was discussed including the need for 7-8 hours of quality sleep each night. A sleep study was not ordered based on symptoms and Epworth score.  Dyspnea on exertion Angela Stark's shortness of breath appears to be obesity related and exercise induced. She has agreed to work on weight loss and gradually increase exercise to treat her exercise induced shortness of breath. If Angela EvansCatherine follows our  instructions and loses weight without improvement of her  shortness of breath, we will plan to refer to pulmonology. We will monitor this condition regularly. Angela Stark agrees to this plan.  Hypertension We discussed sodium restriction, working on healthy weight loss, and a regular exercise program as the means to achieve improved blood pressure control. Angela Stark agreed with this plan and agreed to follow up as directed. We will continue to monitor her blood pressure as well as her progress with the above lifestyle modifications. She will continue her medications and will watch for signs of hypotension as she continues her lifestyle modifications. We will check CMP today and Angela Stark agrees to follow up with our clinic in 2 weeks.  Cardiovascular risk counselling Angela Stark was given extended (15 minutes) coronary artery disease prevention counseling today. She is 47 y.o. female and has risk factors for heart disease including obesity and hypertension. We discussed intensive lifestyle modifications today with an emphasis on specific weight loss instructions and strategies. Pt was also informed of the importance of increasing exercise and decreasing saturated fats to help prevent heart disease.  Depression Screen Angela Stark had a strongly positive depression screening. Depression is commonly associated with obesity and often results in emotional eating behaviors. We will monitor this closely and work on CBT to help improve the non-hunger eating patterns. Referral to Psychology may be required if no improvement is seen as she continues in our clinic.  Obesity Angela Stark is currently in the action stage of change and her goal is to continue with weight loss efforts She has agreed to follow the Category 3 plan + 150 calories Angela Stark has been instructed to work up to a goal of 150 minutes of combined cardio and strengthening exercise per week for weight loss and overall health benefits. We discussed the  following Behavioral Modification Strategies today: increasing lean protein intake, increasing vegetables, work on meal planning and easy cooking plans, and planning for success  Angela Stark has agreed to follow up with our clinic in 2 weeks. She was informed of the importance of frequent follow up visits to maximize her success with intensive lifestyle modifications for her multiple health conditions. She was informed we would discuss her lab results at her next visit unless there is a critical issue that needs to be addressed sooner. Angela Stark agreed to keep her next visit at the agreed upon time to discuss these results.    OBESITY BEHAVIORAL INTERVENTION VISIT  Today's visit was # 1 out of 22.  Starting weight: 252 lbs Starting date: 10/18/17 Today's weight : 252 lbs  Today's date: 10/18/2017 Total lbs lost to date: 0    ASK: We discussed the diagnosis of obesity with Angela Stark today and Angela Stark agreed to give [place] permission to discuss obesity behavioral modification therapy today.  ASSESS: Angela Stark has the diagnosis of obesity and her BMI today is 39.46 Angela Stark is in the action stage of change   ADVISE: Angela Stark was educated on the multiple health risks of obesity as well as the benefit of weight loss to improve her health. She was advised of the need for long term treatment and the importance of lifestyle modifications.  AGREE: Multiple dietary modification options and treatment options were discussed and  Angela Stark agreed to the above obesity treatment plan.   I, Burt Knack, am acting as transcriptionist for Debbra Riding, MD   I have reviewed the above documentation for accuracy and completeness, and I agree with the above. - Debbra Riding, MD

## 2017-10-27 ENCOUNTER — Encounter (INDEPENDENT_AMBULATORY_CARE_PROVIDER_SITE_OTHER): Payer: Self-pay | Admitting: Family Medicine

## 2017-11-01 ENCOUNTER — Ambulatory Visit (INDEPENDENT_AMBULATORY_CARE_PROVIDER_SITE_OTHER): Payer: No Typology Code available for payment source | Admitting: Family Medicine

## 2017-11-01 ENCOUNTER — Encounter: Payer: Self-pay | Admitting: Family Medicine

## 2017-11-01 ENCOUNTER — Ambulatory Visit (INDEPENDENT_AMBULATORY_CARE_PROVIDER_SITE_OTHER): Payer: No Typology Code available for payment source

## 2017-11-01 VITALS — BP 128/82 | HR 86 | Ht 67.0 in

## 2017-11-01 DIAGNOSIS — M25561 Pain in right knee: Secondary | ICD-10-CM

## 2017-11-01 DIAGNOSIS — M25562 Pain in left knee: Secondary | ICD-10-CM

## 2017-11-01 NOTE — Assessment & Plan Note (Signed)
Pain likely associated with degenerative changes and patellofemoral syndrome.  Effusion today. -Aspiration and injection. -Counseled on home exercise therapy. -If no improvement consider updated imaging and physical therapy.

## 2017-11-01 NOTE — Patient Instructions (Signed)
Good to see you  Please try ice and compression  Take tylenol 650 mg three times a day is the best evidence based medicine we have for arthritis.  Glucosamine sulfate 750mg  twice a day is a supplement that has been shown to help moderate to severe arthritis. Vitamin D 2000 IU daily Fish oil 2 grams daily.  Tumeric 500mg  twice daily.  Capsaicin topically up to four times a day may also help with pain.

## 2017-11-01 NOTE — Progress Notes (Signed)
Angela Stark - 47 y.o. female MRN [id number]  Date of birth: 1971/01/05  SUBJECTIVE:  Including CC & ROS.  Chief Complaint  Patient presents with  . Follow-up    Angela Stark is a 47 y.o. female that is here today for bilateral knee pain follow up. She states the pain has not improved. Located at the anterior and medial aspect of her right knee. She has been completing daily exercises with no improvement.  Denies any locking or giving way.  Has never received steroid injections previously.  Independent review of the left knee x-ray from 2017 shows mild medial joint space narrowing.  Review of Systems  Constitutional: Negative for fever.  HENT: Negative for congestion.   Respiratory: Negative for cough.   Cardiovascular: Negative for chest pain.  Gastrointestinal: Negative for abdominal pain.  Musculoskeletal: Negative for back pain.  Skin: Negative for color change.  Neurological: Negative for weakness.  Hematological: Negative for adenopathy.  Psychiatric/Behavioral: Negative for agitation.    HISTORY: Past Medical, Surgical, Social, and Family History Reviewed & Updated per EMR.   Pertinent Historical Findings include:  Past Medical History:  Diagnosis Date  . Anemia   . Anxiety   . Back pain   . Depression   . Fatty liver   . GERD (gastroesophageal reflux disease)   . Heart murmur    When pregnant  . Hyperlipidemia   . Hypertension   . Joint pain   . Knee pain   . Muscle stiffness   . Phlebitis   . Shortness of breath   . Shortness of breath on exertion   . Sleep apnea   . Sleep apnea   . Stress   . Swelling of both lower extremities   . Trouble in sleeping     Past Surgical History:  Procedure Laterality Date  . OVARY SURGERY      No Known Allergies  Family History  Problem Relation Age of Onset  . Hypertension Mother   . Hyperlipidemia Mother   . Diabetes Mother   . Emphysema Mother   . Obesity Mother   . Heart disease Father     . Hypertension Father   . Hyperlipidemia Father   . Obesity Father   . Sleep apnea Father   . Alcoholism Father   . Stroke Brother   . Lung cancer Maternal Grandmother      Social History   Socioeconomic History  . Marital status: Married    Spouse name: Onalee Hua  . Number of children: 3  . Years of education: 40  . Highest education level: Not on file  Occupational History  . Occupation: Scientist, research (medical): [employer]  Social Needs  . Financial resource strain: Not on file  . Food insecurity:    Worry: Not on file    Inability: Not on file  . Transportation needs:    Medical: Not on file    Non-medical: Not on file  Tobacco Use  . Smoking status: Current Every Day Smoker    Packs/day: 0.50    Years: 20.00    Pack years: 10.00    Types: Cigarettes  . Smokeless tobacco: Never Used  Substance and Sexual Activity  . Alcohol use: No  . Drug use: No  . Sexual activity: Yes    Birth control/protection: None  Lifestyle  . Physical activity:    Days per week: Not on file    Minutes per session: Not on file  . Stress: Not  on file  Relationships  . Social connections:    Talks on phone: Not on file    Gets together: Not on file    Attends religious service: Not on file    Active member of club or organization: Not on file    Attends meetings of clubs or organizations: Not on file    Relationship status: Not on file  . Intimate partner violence:    Fear of current or ex partner: Not on file    Emotionally abused: Not on file    Physically abused: Not on file    Forced sexual activity: Not on file  Other Topics Concern  . Not on file  Social History Narrative   Fun: Fishing, camping   Denies religious beliefs effecting health care.      PHYSICAL EXAM:  VS: BP 128/82 (BP Location: Left Arm, Patient Position: Sitting, Cuff Size: Normal)   Pulse 86   Ht 5\' 7"  (1.702 m)   SpO2 98%   BMI 39.47 kg/m  Physical Exam Gen: NAD, alert, cooperative with exam,  well-appearing ENT: normal lips, normal nasal mucosa,  Eye: normal EOM, normal conjunctiva and lids CV:  no edema, +2 pedal pulses   Resp: no accessory muscle use, non-labored,  Skin: no rashes, no areas of induration  Neuro: normal tone, normal sensation to touch Psych:  normal insight, alert and oriented MSK:  Right and left knee: Right knee with effusion. Left knee with no obvious effusion Normal strength resistance with dorsiflexion and plantarflexion.  Normal strength resistance with knee flexion extension. No significant areas of tenderness on the lateral or medial joint line. Normal McMurray's test. Neurovascular intact   Aspiration/Injection Procedure Note Angela BosworthCatherine W Stark [birth year]  Procedure: Aspiration and Injection Indications: Left knee pain  Procedure Details Consent: Risks of procedure as well as the alternatives and risks of each were explained to the (patient/caregiver).  Consent for procedure obtained. Time Out: Verified patient identification, verified procedure, site/side was marked, verified correct patient position, special equipment/implants available, medications/allergies/relevent history reviewed, required imaging and test results available.  Performed.  The area was cleaned with iodine and alcohol swabs.    The left knee superior lateral suprapatellar pouch was injected with 3 cc of 1% lidocaine without epinephrine to anesthetize the tract in the skin.  An 18-gauge inch and half needle was inserted under ultrasound guidance and aspiration was accomplished.  The syringe was switched and a mixture of 1 cc's of 40 mg Depomedrol and 4 cc's of 1% lidocaine was injected.  Ultrasound was used. Images were obtained in Long views showing the injection.    Amount of Fluid Aspirated: 28mL Character of Fluid: clear and straw colored Fluid was sent for:n/a A sterile dressing was applied.  Patient did tolerate procedure well.   Aspiration/Injection Procedure  Note Angela BosworthCatherine W Stark [birth year]  Procedure: Injection Indications: Right knee pain  Procedure Details Consent: Risks of procedure as well as the alternatives and risks of each were explained to the (patient/caregiver).  Consent for procedure obtained. Time Out: Verified patient identification, verified procedure, site/side was marked, verified correct patient position, special equipment/implants available, medications/allergies/relevent history reviewed, required imaging and test results available.  Performed.  The area was cleaned with iodine and alcohol swabs.    The right knee superior lateral suprapatellar pouch was injected using 1 cc's of 40 mg Depomedrol and 4 cc's of 1% lidocaine with a 25 1 1/2" needle.  Ultrasound was used. Images were obtained in Long views showing  the injection.    A sterile dressing was applied.  Patient did tolerate procedure well.      ASSESSMENT & PLAN:   Right knee pain Likely has component of degenerative changes and patellofemoral syndrome as the source. -Injection today. -Counseled on exercise therapy. -If no improvement consider updated x-rays and physical therapy.  Acute pain of left knee Pain likely associated with degenerative changes and patellofemoral syndrome.  Effusion today. -Aspiration and injection. -Counseled on home exercise therapy. -If no improvement consider updated imaging and physical therapy.

## 2017-11-01 NOTE — Assessment & Plan Note (Signed)
Likely has component of degenerative changes and patellofemoral syndrome as the source. -Injection today. -Counseled on exercise therapy. -If no improvement consider updated x-rays and physical therapy.

## 2017-11-02 ENCOUNTER — Ambulatory Visit (INDEPENDENT_AMBULATORY_CARE_PROVIDER_SITE_OTHER): Payer: Self-pay | Admitting: Family Medicine

## 2017-11-02 ENCOUNTER — Telehealth: Payer: Self-pay | Admitting: Family Medicine

## 2017-11-02 ENCOUNTER — Ambulatory Visit: Payer: No Typology Code available for payment source | Admitting: Family Medicine

## 2017-11-02 NOTE — Telephone Encounter (Signed)
Copied from CRM [id number]. Topic: Quick Communication - Rx Refill/Question >> Nov 02, 2017  5:19 PM Raquel SarnaHayes, Teresa G wrote: Diclofenac Sodium (PENNSAID) 2 % SOLN  Needing diagnosis and other medications that was tried previously.  [facility] [phone]

## 2017-11-03 ENCOUNTER — Encounter (INDEPENDENT_AMBULATORY_CARE_PROVIDER_SITE_OTHER): Payer: Self-pay

## 2017-11-03 ENCOUNTER — Ambulatory Visit (INDEPENDENT_AMBULATORY_CARE_PROVIDER_SITE_OTHER): Payer: Self-pay | Admitting: Family Medicine

## 2017-11-03 NOTE — Telephone Encounter (Signed)
Spoke with Angela Stark provided DX code and previous medications

## 2017-11-17 ENCOUNTER — Ambulatory Visit (INDEPENDENT_AMBULATORY_CARE_PROVIDER_SITE_OTHER): Payer: No Typology Code available for payment source | Admitting: Family Medicine

## 2017-11-17 VITALS — BP 154/82 | HR 58 | Temp 98.4°F | Ht 67.0 in | Wt 246.0 lb

## 2017-11-17 DIAGNOSIS — M25561 Pain in right knee: Secondary | ICD-10-CM

## 2017-11-17 DIAGNOSIS — E559 Vitamin D deficiency, unspecified: Secondary | ICD-10-CM

## 2017-11-17 DIAGNOSIS — Z9189 Other specified personal risk factors, not elsewhere classified: Secondary | ICD-10-CM

## 2017-11-17 DIAGNOSIS — I10 Essential (primary) hypertension: Secondary | ICD-10-CM | POA: Diagnosis not present

## 2017-11-17 DIAGNOSIS — Z6838 Body mass index (BMI) 38.0-38.9, adult: Secondary | ICD-10-CM | POA: Diagnosis not present

## 2017-11-17 DIAGNOSIS — M25562 Pain in left knee: Secondary | ICD-10-CM

## 2017-11-17 DIAGNOSIS — E7849 Other hyperlipidemia: Secondary | ICD-10-CM

## 2017-11-17 MED ORDER — VITAMIN D (ERGOCALCIFEROL) 1.25 MG (50000 UNIT) PO CAPS: 50000.0000 [IU] | ORAL_CAPSULE | ORAL | 0 refills | Status: DC

## 2017-11-18 NOTE — Progress Notes (Signed)
Office: [phone]  /  Fax: [phone]   HPI:   Chief Complaint: OBESITY Angela Stark is here to discuss her progress with her obesity treatment plan. She is on the Category 3 plan +150 calories  and is following her eating plan approximately 75 % of the time. She states she is exercising 0 minutes 0 times per week. Elexus found she was getting most of the food in, most of the time. She enjoyed the plan as a whole. Her weight is 246 lb (111.6 kg) today and has had a weight loss of 6 pounds over a period of 4 weeks since her last visit. She has lost 6 lbs since starting treatment with [place].  Hypertension OLAMIDE LAHAIE is a 47 y.o. female with hypertension.Her blood pressure is elevated today, but was controlled previously. LYRICA MCCLARTY denies chest pain, chest pressure or headache. She is working weight loss to help control her blood pressure with the goal of decreasing her risk of heart attack and stroke. Catherines blood pressure is not currently controlled.  Hyperlipidemia Alexys has hyperlipidemia. Her LDL is elevated at 129 and triglycerides are elevated at 253.  Her HDL is within normal limits. Jaelani is attempting to improve her cholesterol levels with intensive lifestyle modification including a low saturated fat diet, exercise and weight loss. She denies any chest pain, claudication or myalgias.  Vitamin D deficiency Chee has a diagnosis of vitamin D deficiency. She is not currently taking vit D and denies nausea, vomiting or muscle weakness.  At risk for osteopenia and osteoporosis Dominica is at higher risk of osteopenia and osteoporosis due to vitamin D deficiency.   Bilateral Knee Pain [place] has bilateral knee pain and she sees a sports medicine doctor.  ALLERGIES: No Known Allergies  MEDICATIONS: Current Outpatient Medications on File Prior to Visit  Medication Sig Dispense Refill  . amLODipine (NORVASC) 10 MG tablet Take 1 tablet (10  mg total) by mouth daily. 90 tablet 0  . buPROPion (WELLBUTRIN XL) 150 MG 24 hr tablet Take 1 tablet (150 mg total) by mouth daily. 90 tablet 0  . chlorthalidone (HYGROTON) 25 MG tablet Take 1 tablet (25 mg total) by mouth daily. 100 tablet 0  . Diclofenac Sodium (PENNSAID) 2 % SOLN Place 1 application onto the skin 2 (two) times daily. 1 Bottle 3  . varenicline (CHANTIX PAK) 0.5 MG X 11 & 1 MG X 42 tablet Take one 0.5 mg tablet by mouth once daily for 3 days, then increase to one 0.5 mg tablet twice daily for 4 days, then increase to one 1 mg tablet twice daily. 53 tablet 0   No current facility-administered medications on file prior to visit.     PAST MEDICAL HISTORY: Past Medical History:  Diagnosis Date  . Anemia   . Anxiety   . Back pain   . Depression   . Fatty liver   . GERD (gastroesophageal reflux disease)   . Heart murmur    When pregnant  . Hyperlipidemia   . Hypertension   . Joint pain   . Knee pain   . Muscle stiffness   . Phlebitis   . Shortness of breath   . Shortness of breath on exertion   . Sleep apnea   . Sleep apnea   . Stress   . Swelling of both lower extremities   . Trouble in sleeping     PAST SURGICAL HISTORY: Past Surgical History:  Procedure Laterality Date  . OVARY SURGERY  SOCIAL HISTORY: Social History   Tobacco Use  . Smoking status: Current Every Day Smoker    Packs/day: 0.50    Years: 20.00    Pack years: 10.00    Types: Cigarettes  . Smokeless tobacco: Never Used  Substance Use Topics  . Alcohol use: No  . Drug use: No    FAMILY HISTORY: Family History  Problem Relation Age of Onset  . Hypertension Mother   . Hyperlipidemia Mother   . Diabetes Mother   . Emphysema Mother   . Obesity Mother   . Heart disease Father   . Hypertension Father   . Hyperlipidemia Father   . Obesity Father   . Sleep apnea Father   . Alcoholism Father   . Stroke Brother   . Lung cancer Maternal Grandmother     ROS: Review of  Systems  Constitutional: Positive for weight loss.  Cardiovascular: Negative for chest pain and claudication.       Negative for chest pressure  Gastrointestinal: Negative for nausea and vomiting.  Musculoskeletal: Negative for myalgias.       Positive for bilateral knee pain Negative for muscle weakness  Neurological: Negative for headaches.    PHYSICAL EXAM: Blood pressure (!) 154/82, pulse (!) 58, temperature 98.4 F (36.9 C), temperature source Oral, height 5\' 7"  (1.702 m), weight 246 lb (111.6 kg), SpO2 98 %. Body mass index is 38.53 kg/m. Physical Exam  Constitutional: She is oriented to person, place, and time. She appears well-developed and well-nourished.  Cardiovascular: Normal rate.  Pulmonary/Chest: Effort normal.  Musculoskeletal: Normal range of motion.  Neurological: She is oriented to person, place, and time.  Skin: Skin is warm and dry.  Psychiatric: She has a normal mood and affect. Her behavior is normal.  Vitals reviewed.   RECENT LABS AND TESTS: BMET    Component Value Date/Time   NA 138 10/18/2017 1141   K 3.9 10/18/2017 1141   CL 92 (L) 10/18/2017 1141   CO2 29 10/18/2017 1141   GLUCOSE 70 10/18/2017 1141   GLUCOSE 107 (H) 09/06/2017 0919   BUN 12 10/18/2017 1141   CREATININE 0.60 10/18/2017 1141   CALCIUM 9.1 10/18/2017 1141   GFRNONAA 109 10/18/2017 1141   GFRAA 126 10/18/2017 1141   Lab Results  Component Value Date   HGBA1C 5.3 10/18/2017   Lab Results  Component Value Date   INSULIN 4.2 10/18/2017   CBC    Component Value Date/Time   WBC 7.1 10/18/2017 1141   WBC 5.3 05/28/2017 1144   RBC 4.70 10/18/2017 1141   RBC 4.66 05/28/2017 1144   HGB 15.0 10/18/2017 1141   HCT 46.3 10/18/2017 1141   PLT 218.0 05/28/2017 1144   MCV 99 (H) 10/18/2017 1141   MCH 31.9 10/18/2017 1141   MCH 35.6 (H) 08/20/2011 0445   MCHC 32.4 10/18/2017 1141   MCHC 33.6 05/28/2017 1144   RDW 13.9 10/18/2017 1141   LYMPHSABS 2.1 10/18/2017 1141    MONOABS 0.4 08/17/2011 2140   EOSABS 0.1 10/18/2017 1141   BASOSABS 0.0 10/18/2017 1141   Iron/TIBC/Ferritin/ %Sat No results found for: IRON, TIBC, FERRITIN, IRONPCTSAT Lipid Panel     Component Value Date/Time   CHOL 233 (H) 10/18/2017 1141   TRIG 253 (H) 10/18/2017 1141   HDL 53 10/18/2017 1141   CHOLHDL 4 05/28/2017 1144   VLDL 50.0 (H) 05/28/2017 1144   LDLCALC 129 (H) 10/18/2017 1141   LDLDIRECT 108.0 05/28/2017 1144   Hepatic Function Panel  Component Value Date/Time   PROT 7.3 10/18/2017 1141   ALBUMIN 4.6 10/18/2017 1141   AST 35 10/18/2017 1141   ALT 34 (H) 10/18/2017 1141   ALKPHOS 55 10/18/2017 1141   BILITOT 0.4 10/18/2017 1141   BILIDIR 0.4 (H) 07/30/2015 0957      Component Value Date/Time   TSH 2.230 10/18/2017 1141   TSH 2.37 05/28/2017 1144   TSH 3.80 12/13/2014 0912   Results for Mikle BosworthGARRISON, Alsha W (MRN [id number]) as of 11/18/2017 13:48  Ref. Range 10/18/2017 11:41  Vitamin D, 25-Hydroxy Latest Ref Range: 30.0 - 100.0 ng/mL 18.0 (L)   ASSESSMENT AND PLAN: Essential hypertension  Other hyperlipidemia  Vitamin D deficiency - Plan: Vitamin D, Ergocalciferol, (DRISDOL) 50000 units CAPS capsule  Pain in both knees, unspecified chronicity  At risk for osteoporosis  Class 2 severe obesity with serious comorbidity and body mass index (BMI) of 38.0 to 38.9 in adult, unspecified obesity type (HCC)  PLAN:  Hypertension We discussed sodium restriction, working on healthy weight loss, and a regular exercise program as the means to achieve improved blood pressure control. Santina EvansCatherine agreed with this plan and agreed to follow up as directed. We will follow up with her blood pressure at the next appointment and will continue to monitor her blood pressure as well as her progress with the above lifestyle modifications. She will continue her medications as prescribed and will watch for signs of hypotension as she continues her lifestyle  modifications.  Hyperlipidemia Santina EvansCatherine was informed of the American Heart Association Guidelines emphasizing intensive lifestyle modifications as the first line treatment for hyperlipidemia. We discussed many lifestyle modifications today in depth, and Santina EvansCatherine will continue to work on decreasing saturated fats such as fatty red meat, butter and many fried foods. She will also increase vegetables and lean protein in her diet and continue to work on exercise and weight loss efforts. We will repeat fasting lipid panel in 3 months.  Vitamin D Deficiency Santina EvansCatherine was informed that low vitamin D levels contributes to fatigue and are associated with obesity, breast, and colon cancer. She agrees to start prescription Vit D @50 ,000 IU every week #4 with no refills and will follow up for routine testing of vitamin D, at least 2-3 times per year. She was informed of the risk of over-replacement of vitamin D and agrees to not increase her dose unless she discusses this with us first. Santina EvansCatherine agrees to follow up as directed.  At risk for osteopenia and osteoporosis Santina EvansCatherine is at risk for osteopenia and osteoporosis due to her vitamin D deficiency. She was encouraged to take her vitamin D and follow her higher calcium diet and increase strengthening exercise to help strengthen her bones and decrease her risk of osteopenia and osteoporosis.  Bilateral Knee Pain Santina EvansCatherine is to follow up with her orthopaedic and sports medicine doctor. She will follow up with our clinic in 2 weeks.  Obesity Santina EvansCatherine is currently in the action stage of change. As such, her goal is to continue with weight loss efforts She has agreed to follow the Category 3 plan Santina EvansCatherine has been instructed to work up to a goal of 150 minutes of combined cardio and strengthening exercise per week for weight loss and overall health benefits. We discussed the following Behavioral Modification Strategies today: increase H2O intake, planning  for success, increasing lean protein intake, increasing vegetables and work on meal planning and easy cooking plans   Santina EvansCatherine has agreed to follow up with our clinic in 2  weeks. She was informed of the importance of frequent follow up visits to maximize her success with intensive lifestyle modifications for her multiple health conditions.   OBESITY BEHAVIORAL INTERVENTION VISIT  Today's visit was # 2   Starting weight: 252 lbs Starting date: 10/18/17 Today's weight : 246 lbs Today's date: 11/17/2017 Total lbs lost to date: 6 At least 15 minutes were spent on discussing the following behavioral intervention visit.   ASK: We discussed the diagnosis of obesity with Mikle Bosworth today and Lygia agreed to give [place] permission to discuss obesity behavioral modification therapy today.  ASSESS: Allsion has the diagnosis of obesity and her BMI today is 38.52 Aizlyn is in the action stage of change   ADVISE: Toshia was educated on the multiple health risks of obesity as well as the benefit of weight loss to improve her health. She was advised of the need for long term treatment and the importance of lifestyle modifications to improve her current health and to decrease her risk of future health problems.  AGREE: Multiple dietary modification options and treatment options were discussed and  Iysha agreed to follow the recommendations documented in the above note.  ARRANGE: Pinkey was educated on the importance of frequent visits to treat obesity as outlined per CMS and USPSTF guidelines and agreed to schedule her next follow up appointment today.  I, [place] Crane, am acting as transcriptionist for Filbert Schilder, MD  I have reviewed the above documentation for accuracy and completeness, and I agree with the above. - Debbra Riding, MD

## 2017-11-24 MED FILL — VIT D2 1.25 MG (50,000 UNIT: 1.25 MG | 28 days supply | Qty: 4 | Fill #0

## 2017-12-06 ENCOUNTER — Ambulatory Visit (INDEPENDENT_AMBULATORY_CARE_PROVIDER_SITE_OTHER): Payer: No Typology Code available for payment source | Admitting: Family Medicine

## 2017-12-06 VITALS — BP 139/83 | HR 64 | Temp 97.7°F | Ht 67.0 in | Wt 246.0 lb

## 2017-12-06 DIAGNOSIS — I10 Essential (primary) hypertension: Secondary | ICD-10-CM

## 2017-12-06 DIAGNOSIS — Z6838 Body mass index (BMI) 38.0-38.9, adult: Secondary | ICD-10-CM

## 2017-12-06 DIAGNOSIS — E559 Vitamin D deficiency, unspecified: Secondary | ICD-10-CM | POA: Diagnosis not present

## 2017-12-07 MED FILL — CHANTIX STARTING MONTH BOX: 0.5 MG X 11 | 28 days supply | Qty: 53 | Fill #0

## 2017-12-08 NOTE — Progress Notes (Signed)
Office: [phone]  /  Fax: [phone]   HPI:   Chief Complaint: OBESITY Angela Stark is here to discuss her progress with her obesity treatment plan. She is on the Category 3 plan and is following her eating plan approximately 50 % of the time. She states she is exercising 0 minutes 0 times per week. Angela Stark had a vacation recently with lots of good cooking and indulgences. She wants to get back on track.   Her weight is 246 lb (111.6 kg) today and has not lost weight since her last visit. She has lost 6 lbs since starting treatment with [place].  Vitamin D deficiency Angela Stark has a diagnosis of vitamin D deficiency. She is currently taking vit D. She admits fatigue and denies nausea, vomiting or muscle weakness.  Hypertension Angela Stark is a 47 y.o. female with hypertension. Angela Stark denies chest pain, chest pressure, or headache. She is working weight loss to help control her blood pressure with the goal of decreasing her risk of heart attack and stroke. [name]'s blood pressure is currently controlled today.  ALLERGIES: No Known Allergies  MEDICATIONS: Current Outpatient Medications on File Prior to Visit  Medication Sig Dispense Refill  . amLODipine (NORVASC) 10 MG tablet Take 1 tablet (10 mg total) by mouth daily. 90 tablet 0  . buPROPion (WELLBUTRIN XL) 150 MG 24 hr tablet Take 1 tablet (150 mg total) by mouth daily. 90 tablet 0  . chlorthalidone (HYGROTON) 25 MG tablet Take 1 tablet (25 mg total) by mouth daily. 100 tablet 0  . Diclofenac Sodium (PENNSAID) 2 % SOLN Place 1 application onto the skin 2 (two) times daily. 1 Bottle 3  . varenicline (CHANTIX PAK) 0.5 MG X 11 & 1 MG X 42 tablet Take one 0.5 mg tablet by mouth once daily for 3 days, then increase to one 0.5 mg tablet twice daily for 4 days, then increase to one 1 mg tablet twice daily. 53 tablet 0  . Vitamin D, Ergocalciferol, (DRISDOL) 50000 units CAPS capsule Take 1 capsule (50,000 Units  total) by mouth every 7 (seven) days. 4 capsule 0   No current facility-administered medications on file prior to visit.     PAST MEDICAL HISTORY: Past Medical History:  Diagnosis Date  . Anemia   . Anxiety   . Back pain   . Depression   . Fatty liver   . GERD (gastroesophageal reflux disease)   . Heart murmur    When pregnant  . Hyperlipidemia   . Hypertension   . Joint pain   . Knee pain   . Muscle stiffness   . Phlebitis   . Shortness of breath   . Shortness of breath on exertion   . Sleep apnea   . Sleep apnea   . Stress   . Swelling of both lower extremities   . Trouble in sleeping     PAST SURGICAL HISTORY: Past Surgical History:  Procedure Laterality Date  . OVARY SURGERY      SOCIAL HISTORY: Social History   Tobacco Use  . Smoking status: Current Every Day Smoker    Packs/day: 0.50    Years: 20.00    Pack years: 10.00    Types: Cigarettes  . Smokeless tobacco: Never Used  Substance Use Topics  . Alcohol use: No  . Drug use: No    FAMILY HISTORY: Family History  Problem Relation Age of Onset  . Hypertension Mother   . Hyperlipidemia Mother   . Diabetes  Mother   . Emphysema Mother   . Obesity Mother   . Heart disease Father   . Hypertension Father   . Hyperlipidemia Father   . Obesity Father   . Sleep apnea Father   . Alcoholism Father   . Stroke Brother   . Lung cancer Maternal Grandmother     ROS: Review of Systems  Constitutional: Positive for malaise/fatigue. Negative for weight loss.  Cardiovascular: Negative for chest pain.       Negative for chest pressure.  Gastrointestinal: Negative for nausea and vomiting.  Musculoskeletal:       Negative for muscle weakness.  Neurological: Negative for headaches.    PHYSICAL EXAM: Blood pressure 139/83, pulse 64, temperature 97.7 F (36.5 C), temperature source Oral, height 5\' 7"  (1.702 m), weight 246 lb (111.6 kg), SpO2 99 %. Body mass index is 38.53 kg/m. Physical Exam    Constitutional: She is oriented to person, place, and time. She appears well-developed and well-nourished.  Cardiovascular: Normal rate.  Pulmonary/Chest: Effort normal.  Musculoskeletal: Normal range of motion.  Neurological: She is oriented to person, place, and time.  Skin: Skin is warm and dry.  Psychiatric: She has a normal mood and affect. Her behavior is normal.  Vitals reviewed.   RECENT LABS AND TESTS: BMET    Component Value Date/Time   NA 138 10/18/2017 1141   K 3.9 10/18/2017 1141   CL 92 (L) 10/18/2017 1141   CO2 29 10/18/2017 1141   GLUCOSE 70 10/18/2017 1141   GLUCOSE 107 (H) 09/06/2017 0919   BUN 12 10/18/2017 1141   CREATININE 0.60 10/18/2017 1141   CALCIUM 9.1 10/18/2017 1141   GFRNONAA 109 10/18/2017 1141   GFRAA 126 10/18/2017 1141   Lab Results  Component Value Date   HGBA1C 5.3 10/18/2017   Lab Results  Component Value Date   INSULIN 4.2 10/18/2017   CBC    Component Value Date/Time   WBC 7.1 10/18/2017 1141   WBC 5.3 05/28/2017 1144   RBC 4.70 10/18/2017 1141   RBC 4.66 05/28/2017 1144   HGB 15.0 10/18/2017 1141   HCT 46.3 10/18/2017 1141   PLT 218.0 05/28/2017 1144   MCV 99 (H) 10/18/2017 1141   MCH 31.9 10/18/2017 1141   MCH 35.6 (H) 08/20/2011 0445   MCHC 32.4 10/18/2017 1141   MCHC 33.6 05/28/2017 1144   RDW 13.9 10/18/2017 1141   LYMPHSABS 2.1 10/18/2017 1141   MONOABS 0.4 08/17/2011 2140   EOSABS 0.1 10/18/2017 1141   BASOSABS 0.0 10/18/2017 1141   Iron/TIBC/Ferritin/ %Sat No results found for: IRON, TIBC, FERRITIN, IRONPCTSAT Lipid Panel     Component Value Date/Time   CHOL 233 (H) 10/18/2017 1141   TRIG 253 (H) 10/18/2017 1141   HDL 53 10/18/2017 1141   CHOLHDL 4 05/28/2017 1144   VLDL 50.0 (H) 05/28/2017 1144   LDLCALC 129 (H) 10/18/2017 1141   LDLDIRECT 108.0 05/28/2017 1144   Hepatic Function Panel     Component Value Date/Time   PROT 7.3 10/18/2017 1141   ALBUMIN 4.6 10/18/2017 1141   AST 35 10/18/2017 1141    ALT 34 (H) 10/18/2017 1141   ALKPHOS 55 10/18/2017 1141   BILITOT 0.4 10/18/2017 1141   BILIDIR 0.4 (H) 07/30/2015 0957      Component Value Date/Time   TSH 2.230 10/18/2017 1141   TSH 2.37 05/28/2017 1144   TSH 3.80 12/13/2014 0912    ASSESSMENT AND PLAN: Vitamin D deficiency  Essential hypertension  Class 2  severe obesity with serious comorbidity and body mass index (BMI) of 38.0 to 38.9 in adult, unspecified obesity type (HCC)  PLAN:  Vitamin D Deficiency Santina EvansCatherine was informed that low vitamin D levels contributes to fatigue and are associated with obesity, breast, and colon cancer. Sheagrees to continue to take prescription Vit D @50 ,000 IU every week with no refills needed and will follow up for routine testing of vitamin D, at least 2-3 times per year. She was informed of the risk of over-replacement of vitamin D and agrees to not increase her dose unless she discusses this with us first.  Hypertension We discussed sodium restriction, working on healthy weight loss, and a regular exercise program as the means to achieve improved blood pressure control. Santina EvansCatherine agreed with this plan and agreed to follow up as directed. We will continue to monitor her blood pressure as well as her progress with the above lifestyle modifications. She will continue her current medications as prescribed and will watch for signs of hypotension as she continues her lifestyle modifications. We will follow up at next appointment in 2 weeks.  Obesity Santina EvansCatherine is currently in the action stage of change. As such, her goal is to continue with weight loss efforts. She has agreed to follow the Category 3 plan. Santina EvansCatherine has been instructed to work up to a goal of 150 minutes of combined cardio and strengthening exercise per week for weight loss and overall health benefits. We discussed the following Behavioral Modification Strategies today: increasing lean protein intake, increasing vegetables, work on  meal planning and easy cooking plans, better snacking choices, and planning for success.  I spent > than 50% of the 15 minute visit on counseling as documented in the note.  Santina EvansCatherine has agreed to follow up with our clinic in 2 weeks. She was informed of the importance of frequent follow up visits to maximize her success with intensive lifestyle modifications for her multiple health conditions.   OBESITY BEHAVIORAL INTERVENTION VISIT  Today's visit was # 3  Starting weight: 252 lbs Starting date: 10/18/17 Today's weight : Weight: 246 lb (111.6 kg)  Today's date: 12/06/2017 Total lbs lost to date: 6  ASK: We discussed the diagnosis of obesity with Mikle Bosworthatherine W [place] today and Santina EvansCatherine agreed to give us permission to discuss obesity behavioral modification therapy today.  ASSESS: Santina EvansCatherine has the diagnosis of obesity and her BMI today is 38.52. Santina EvansCatherine is in the action stage of change.   ADVISE: Santina EvansCatherine was educated on the multiple health risks of obesity as well as the benefit of weight loss to improve her health. She was advised of the need for long term treatment and the importance of lifestyle modifications to improve her current health and to decrease her risk of future health problems.  AGREE: Multiple dietary modification options and treatment options were discussed and Santina EvansCatherine agreed to follow the recommendations documented in the above note.  ARRANGE: Santina EvansCatherine was educated on the importance of frequent visits to treat obesity as outlined per CMS and USPSTF guidelines and agreed to schedule her next follow up appointment today.  I, Kirke Corinara Soares, am acting as Energy managertranscriptionist for Filbert SchilderAlexandria U. Kadolph, MD  I have reviewed the above documentation for accuracy and completeness, and I agree with the above. - Debbra RidingAlexandria Kadolph, MD

## 2017-12-22 ENCOUNTER — Ambulatory Visit: Payer: No Typology Code available for payment source | Admitting: Family Medicine

## 2017-12-22 ENCOUNTER — Ambulatory Visit (INDEPENDENT_AMBULATORY_CARE_PROVIDER_SITE_OTHER): Payer: No Typology Code available for payment source | Admitting: Family Medicine

## 2017-12-22 VITALS — BP 125/82 | HR 60 | Temp 98.4°F | Ht 67.0 in | Wt 246.0 lb

## 2017-12-22 DIAGNOSIS — M170 Bilateral primary osteoarthritis of knee: Secondary | ICD-10-CM

## 2017-12-22 DIAGNOSIS — E559 Vitamin D deficiency, unspecified: Secondary | ICD-10-CM | POA: Diagnosis not present

## 2017-12-22 DIAGNOSIS — Z6838 Body mass index (BMI) 38.0-38.9, adult: Secondary | ICD-10-CM

## 2017-12-22 DIAGNOSIS — Z9189 Other specified personal risk factors, not elsewhere classified: Secondary | ICD-10-CM

## 2017-12-22 DIAGNOSIS — I10 Essential (primary) hypertension: Secondary | ICD-10-CM

## 2017-12-22 MED ORDER — VITAMIN D (ERGOCALCIFEROL) 1.25 MG (50000 UNIT) PO CAPS: 50000.0000 [IU] | ORAL_CAPSULE | ORAL | 0 refills | Status: DC

## 2017-12-22 MED FILL — VIT D2 1.25 MG (50,000 UNIT: 1.25 MG | 28 days supply | Qty: 4 | Fill #0

## 2017-12-22 NOTE — Progress Notes (Signed)
Angela Stark - 47 y.o. female MRN [id number]  Date of birth: 01/14/1971  SUBJECTIVE:  Including CC & ROS.  Chief Complaint  Patient presents with  . Follow-up    Angela Stark is a 47 y.o. female that is here today for bilateral knee pain follow up. Pain has not improved. She received an injection on 11/01/17 bilaterally, she did not feel any improvement. Her left knee is worse than her right. Pain is worse with activity. She feels a pop in her left knee, worse when walking stairs. She takes motrin for the pain.     Review of Systems  Constitutional: Negative for fever.  HENT: Negative for congestion.   Respiratory: Negative for cough.   Cardiovascular: Negative for chest pain.  Gastrointestinal: Negative for abdominal pain.  Musculoskeletal: Positive for gait problem.  Skin: Negative for color change.  Neurological: Negative for weakness.  Hematological: Negative for adenopathy.  Psychiatric/Behavioral: Negative for agitation.    HISTORY: Past Medical, Surgical, Social, and Family History Reviewed & Updated per EMR.   Pertinent Historical Findings include:  Past Medical History:  Diagnosis Date  . Anemia   . Anxiety   . Back pain   . Depression   . Fatty liver   . GERD (gastroesophageal reflux disease)   . Heart murmur    When pregnant  . Hyperlipidemia   . Hypertension   . Joint pain   . Knee pain   . Muscle stiffness   . Phlebitis   . Shortness of breath   . Shortness of breath on exertion   . Sleep apnea   . Sleep apnea   . Stress   . Swelling of both lower extremities   . Trouble in sleeping     Past Surgical History:  Procedure Laterality Date  . OVARY SURGERY      No Known Allergies  Family History  Problem Relation Age of Onset  . Hypertension Mother   . Hyperlipidemia Mother   . Diabetes Mother   . Emphysema Mother   . Obesity Mother   . Heart disease Father   . Hypertension Father   . Hyperlipidemia Father   . Obesity  Father   . Sleep apnea Father   . Alcoholism Father   . Stroke Brother   . Lung cancer Maternal Grandmother      Social History   Socioeconomic History  . Marital status: Married    Spouse name: Onalee Hua  . Number of children: 3  . Years of education: 65  . Highest education level: Not on file  Occupational History  . Occupation: Scientist, research (medical): [employer]  Social Needs  . Financial resource strain: Not on file  . Food insecurity:    Worry: Not on file    Inability: Not on file  . Transportation needs:    Medical: Not on file    Non-medical: Not on file  Tobacco Use  . Smoking status: Current Every Day Smoker    Packs/day: 0.50    Years: 20.00    Pack years: 10.00    Types: Cigarettes  . Smokeless tobacco: Never Used  Substance and Sexual Activity  . Alcohol use: No  . Drug use: No  . Sexual activity: Yes    Birth control/protection: None  Lifestyle  . Physical activity:    Days per week: Not on file    Minutes per session: Not on file  . Stress: Not on file  Relationships  . Social  connections:    Talks on phone: Not on file    Gets together: Not on file    Attends religious service: Not on file    Active member of club or organization: Not on file    Attends meetings of clubs or organizations: Not on file    Relationship status: Not on file  . Intimate partner violence:    Fear of current or ex partner: Not on file    Emotionally abused: Not on file    Physically abused: Not on file    Forced sexual activity: Not on file  Other Topics Concern  . Not on file  Social History Narrative   Fun: Fishing, camping   Denies religious beliefs effecting health care.      PHYSICAL EXAM:  VS: BP 136/74   Pulse 82   Ht 5\' 7"  (1.702 m)   Wt 248 lb (112.5 kg)   SpO2 98%   BMI 38.84 kg/m  Physical Exam Gen: NAD, alert, cooperative with exam, well-appearing ENT: normal lips, normal nasal mucosa,  Eye: normal EOM, normal conjunctiva and lids CV:  no edema,  +2 pedal pulses   Resp: no accessory muscle use, non-labored,  Skin: no rashes, no areas of induration  Neuro: normal tone, normal sensation to touch Psych:  normal insight, alert and oriented MSK:  Right and left knee:  No obvious effusion  Normal ROM  Normal strength  Mild TTP peripatellar and medial joint line  Negative McMurray's  Neurovascularly intact       ASSESSMENT & PLAN:   Acute pain of left knee Pain is ongoing and worse than her right. Mild improvement with steroid injection. 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  - xray today  - PT  - try gel injections.   Right knee pain Less painful than left. 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  - xrays today  - refer to PT  - try gel injections.

## 2017-12-22 NOTE — Progress Notes (Deleted)
Angela Stark - 47 y.o. female MRN [id number]  Date of birth: 10/27/1970  SUBJECTIVE:  Including CC & ROS.  No chief complaint on file.   Angela Stark is a 47 y.o. female that is  ***.  ***   Review of Systems  HISTORY: Past Medical, Surgical, Social, and Family History Reviewed & Updated per EMR.   Pertinent Historical Findings include:  Past Medical History:  Diagnosis Date  . Anemia   . Anxiety   . Back pain   . Depression   . Fatty liver   . GERD (gastroesophageal reflux disease)   . Heart murmur    When pregnant  . Hyperlipidemia   . Hypertension   . Joint pain   . Knee pain   . Muscle stiffness   . Phlebitis   . Shortness of breath   . Shortness of breath on exertion   . Sleep apnea   . Sleep apnea   . Stress   . Swelling of both lower extremities   . Trouble in sleeping     Past Surgical History:  Procedure Laterality Date  . OVARY SURGERY      No Known Allergies  Family History  Problem Relation Age of Onset  . Hypertension Mother   . Hyperlipidemia Mother   . Diabetes Mother   . Emphysema Mother   . Obesity Mother   . Heart disease Father   . Hypertension Father   . Hyperlipidemia Father   . Obesity Father   . Sleep apnea Father   . Alcoholism Father   . Stroke Brother   . Lung cancer Maternal Grandmother      Social History   Socioeconomic History  . Marital status: Married    Spouse name: David  . Number of children: 3  . Years of education: 14  . Highest education level: Not on file  Occupational History  . Occupation: CNA    Employer: [employer]  Social Needs  . Financial resource strain: Not on file  . Food insecurity:    Worry: Not on file    Inability: Not on file  . Transportation needs:    Medical: Not on file    Non-medical: Not on file  Tobacco Use  . Smoking status: Current Every Day Smoker    Packs/day: 0.50    Years: 20.00    Pack years: 10.00    Types: Cigarettes  . Smokeless tobacco:  Never Used  Substance and Sexual Activity  . Alcohol use: No  . Drug use: No  . Sexual activity: Yes    Birth control/protection: None  Lifestyle  . Physical activity:    Days per week: Not on file    Minutes per session: Not on file  . Stress: Not on file  Relationships  . Social connections:    Talks on phone: Not on file    Gets together: Not on file    Attends religious service: Not on file    Active member of club or organization: Not on file    Attends meetings of clubs or organizations: Not on file    Relationship status: Not on file  . Intimate partner violence:    Fear of current or ex partner: Not on file    Emotionally abused: Not on file    Physically abused: Not on file    Forced sexual activity: Not on file  Other Topics Concern  . Not on file  Social History Narrative   Fun:   Fishing, camping   Denies religious beliefs effecting health care.      PHYSICAL EXAM:  VS: There were no vitals taken for this visit. Physical Exam Gen: NAD, alert, cooperative with exam, well-appearing ENT: normal lips, normal nasal mucosa,  Eye: normal EOM, normal conjunctiva and lids CV:  no edema, +2 pedal pulses   Resp: no accessory muscle use, non-labored,  GI: no masses or tenderness, no hernia  Skin: no rashes, no areas of induration  Neuro: normal tone, normal sensation to touch Psych:  normal insight, alert and oriented MSK:  ***      ASSESSMENT & PLAN:   No problem-specific Assessment & Plan notes found for this encounter.   

## 2017-12-22 NOTE — Progress Notes (Signed)
Office: [phone]  /  Fax: [phone]   HPI:   Chief Complaint: OBESITY Angela Stark is here to discuss her progress with her obesity treatment plan. She is on the Category 4 plan and is following her eating plan approximately 0 % of the time. She states she is lifting weights 15 minutes 3 times per week. Angela Stark reports that the last few weeks have not been the best in terms of planning and prepping. Her weight is 246 lb (111.6 kg) today and she has maintained weight over a period of 2 weeks since her last visit. She has lost 6 lbs since starting treatment with [place].  Vitamin D deficiency Angela Stark has a diagnosis of vitamin D deficiency. Angela Stark is currently taking vit D and she admits to fatigue, but she denies nausea, vomiting or muscle weakness.  At risk for osteopenia and osteoporosis Angela Stark is at higher risk of osteopenia and osteoporosis due to vitamin D deficiency.   Hypertension Angela Stark is a 47 y.o. female with hypertension. Angela Stark denies chest pain, chest pressure or headache. She is working weight loss to help control her blood pressure with the goal of decreasing her risk of heart attack and stroke. Angela Stark blood pressure is currently controlled.     ALLERGIES: No Known Allergies  MEDICATIONS: Current Outpatient Medications on File Prior to Visit  Medication Sig Dispense Refill  . amLODipine (NORVASC) 10 MG tablet Take 1 tablet (10 mg total) by mouth daily. 90 tablet 0  . buPROPion (WELLBUTRIN XL) 150 MG 24 hr tablet Take 1 tablet (150 mg total) by mouth daily. 90 tablet 0  . chlorthalidone (HYGROTON) 25 MG tablet Take 1 tablet (25 mg total) by mouth daily. 100 tablet 0  . Diclofenac Sodium (PENNSAID) 2 % SOLN Place 1 application onto the skin 2 (two) times daily. 1 Bottle 3  . varenicline (CHANTIX PAK) 0.5 MG X 11 & 1 MG X 42 tablet Take one 0.5 mg tablet by mouth once daily for 3 days, then increase to one 0.5 mg tablet twice  daily for 4 days, then increase to one 1 mg tablet twice daily. 53 tablet 0   No current facility-administered medications on file prior to visit.     PAST MEDICAL HISTORY: Past Medical History:  Diagnosis Date  . Anemia   . Anxiety   . Back pain   . Depression   . Fatty liver   . GERD (gastroesophageal reflux disease)   . Heart murmur    When pregnant  . Hyperlipidemia   . Hypertension   . Joint pain   . Knee pain   . Muscle stiffness   . Phlebitis   . Shortness of breath   . Shortness of breath on exertion   . Sleep apnea   . Sleep apnea   . Stress   . Swelling of both lower extremities   . Trouble in sleeping     PAST SURGICAL HISTORY: Past Surgical History:  Procedure Laterality Date  . OVARY SURGERY      SOCIAL HISTORY: Social History   Tobacco Use  . Smoking status: Current Every Day Smoker    Packs/day: 0.50    Years: 20.00    Pack years: 10.00    Types: Cigarettes  . Smokeless tobacco: Never Used  Substance Use Topics  . Alcohol use: No  . Drug use: No    FAMILY HISTORY: Family History  Problem Relation Age of Onset  . Hypertension Mother   .  Hyperlipidemia Mother   . Diabetes Mother   . Emphysema Mother   . Obesity Mother   . Heart disease Father   . Hypertension Father   . Hyperlipidemia Father   . Obesity Father   . Sleep apnea Father   . Alcoholism Father   . Stroke Brother   . Lung cancer Maternal Grandmother     ROS: Review of Systems  Constitutional: Positive for malaise/fatigue. Negative for weight loss.  Cardiovascular: Negative for chest pain.       Negative for chest pressure  Gastrointestinal: Negative for nausea and vomiting.  Musculoskeletal:       Negative for muscle weakness  Neurological: Negative for headaches.    PHYSICAL EXAM: Blood pressure 125/82, pulse 60, temperature 98.4 F (36.9 C), temperature source Oral, height 5\' 7"  (1.702 m), weight 246 lb (111.6 kg), SpO2 98 %. Body mass index is 38.53  kg/m. Physical Exam  Constitutional: She is oriented to person, place, and time. She appears well-developed and well-nourished.  Cardiovascular: Normal rate.  Pulmonary/Chest: Effort normal.  Musculoskeletal: Normal range of motion.  Neurological: She is oriented to person, place, and time.  Skin: Skin is warm and dry.  Psychiatric: She has a normal mood and affect. Her behavior is normal.  Vitals reviewed.   RECENT LABS AND TESTS: BMET    Component Value Date/Time   NA 138 10/18/2017 1141   K 3.9 10/18/2017 1141   CL 92 (L) 10/18/2017 1141   CO2 29 10/18/2017 1141   GLUCOSE 70 10/18/2017 1141   GLUCOSE 107 (H) 09/06/2017 0919   BUN 12 10/18/2017 1141   CREATININE 0.60 10/18/2017 1141   CALCIUM 9.1 10/18/2017 1141   GFRNONAA 109 10/18/2017 1141   GFRAA 126 10/18/2017 1141   Lab Results  Component Value Date   HGBA1C 5.3 10/18/2017   Lab Results  Component Value Date   INSULIN 4.2 10/18/2017   CBC    Component Value Date/Time   WBC 7.1 10/18/2017 1141   WBC 5.3 05/28/2017 1144   RBC 4.70 10/18/2017 1141   RBC 4.66 05/28/2017 1144   HGB 15.0 10/18/2017 1141   HCT 46.3 10/18/2017 1141   PLT 218.0 05/28/2017 1144   MCV 99 (H) 10/18/2017 1141   MCH 31.9 10/18/2017 1141   MCH 35.6 (H) 08/20/2011 0445   MCHC 32.4 10/18/2017 1141   MCHC 33.6 05/28/2017 1144   RDW 13.9 10/18/2017 1141   LYMPHSABS 2.1 10/18/2017 1141   MONOABS 0.4 08/17/2011 2140   EOSABS 0.1 10/18/2017 1141   BASOSABS 0.0 10/18/2017 1141   Iron/TIBC/Ferritin/ %Sat No results found for: IRON, TIBC, FERRITIN, IRONPCTSAT Lipid Panel     Component Value Date/Time   CHOL 233 (H) 10/18/2017 1141   TRIG 253 (H) 10/18/2017 1141   HDL 53 10/18/2017 1141   CHOLHDL 4 05/28/2017 1144   VLDL 50.0 (H) 05/28/2017 1144   LDLCALC 129 (H) 10/18/2017 1141   LDLDIRECT 108.0 05/28/2017 1144   Hepatic Function Panel     Component Value Date/Time   PROT 7.3 10/18/2017 1141   ALBUMIN 4.6 10/18/2017 1141    AST 35 10/18/2017 1141   ALT 34 (H) 10/18/2017 1141   ALKPHOS 55 10/18/2017 1141   BILITOT 0.4 10/18/2017 1141   BILIDIR 0.4 (H) 07/30/2015 0957      Component Value Date/Time   TSH 2.230 10/18/2017 1141   TSH 2.37 05/28/2017 1144   TSH 3.80 12/13/2014 0912   Results for Angela, Stark (MRN [id number]) as  of 12/22/2017 16:55  Ref. Range 10/18/2017 11:41  Vitamin D, 25-Hydroxy Latest Ref Range: 30.0 - 100.0 ng/mL 18.0 (L)   ASSESSMENT AND PLAN: Vitamin D deficiency - Plan: Vitamin D, Ergocalciferol, (DRISDOL) 50000 units CAPS capsule  Essential hypertension  At risk for osteoporosis  Class 2 severe obesity with serious comorbidity and body mass index (BMI) of 38.0 to 38.9 in adult, unspecified obesity type (HCC)  PLAN:  Vitamin D Deficiency Angela Stark was informed that low vitamin D levels contributes to fatigue and are associated with obesity, breast, and colon cancer. She agrees to continue to take prescription Vit D @50 ,000 IU every week #4 with no refills and will follow up for routine testing of vitamin D, at least 2-3 times per year. She was informed of the risk of over-replacement of vitamin D and agrees to not increase her dose unless she discusses this with [place] first. Angela Stark agrees to follow up as directed.  At risk for osteopenia and osteoporosis Angela Stark was given extended  (15 minutes) osteoporosis prevention counseling today. Angela Stark is at risk for osteopenia and osteoporosis due to her vitamin D deficiency. She was encouraged to take her vitamin D and follow her higher calcium diet and increase strengthening exercise to help strengthen her bones and decrease her risk of osteopenia and osteoporosis.  Hypertension We discussed sodium restriction, working on healthy weight loss, and a regular exercise program as the means to achieve improved blood pressure control. Brandace agreed with this plan and agreed to follow up as directed. We will continue to monitor her  blood pressure as well as her progress with the above lifestyle modifications. She will continue her medications as prescribed and will watch for signs of hypotension as she continues her lifestyle modifications.  Obesity Angela Stark is currently in the action stage of change. As such, her goal is to continue with weight loss efforts She has agreed to follow the Category 4 plan Anberlyn has been instructed to work up to a goal of 150 minutes of combined cardio and strengthening exercise per week for weight loss and overall health benefits. We discussed the following Behavioral Modification Strategies today: planning for success, increasing lean protein intake, increasing vegetables and work on meal planning and easy cooking plans  Angela Stark has agreed to follow up with our clinic in 2 weeks. She was informed of the importance of frequent follow up visits to maximize her success with intensive lifestyle modifications for her multiple health conditions.   OBESITY BEHAVIORAL INTERVENTION VISIT  Today's visit was # 4   Starting weight: 252 lbs Starting date: 10/18/17 Today's weight : 246 lbs  Today's date: 12/22/2017 Total lbs lost to date: 6   ASK: We discussed the diagnosis of obesity with Angela Stark today and Angela Stark agreed to give [place] permission to discuss obesity behavioral modification therapy today.  ASSESS: Angela Stark has the diagnosis of obesity and her BMI today is 38.52 Angela Stark is in the action stage of change   ADVISE: Angela Stark was educated on the multiple health risks of obesity as well as the benefit of weight loss to improve her health. She was advised of the need for long term treatment and the importance of lifestyle modifications to improve her current health and to decrease her risk of future health problems.  AGREE: Multiple dietary modification options and treatment options were discussed and  Angela Stark agreed to follow the recommendations documented in  the above note.  ARRANGE: Angela Stark was educated on the importance of frequent visits to  treat obesity as outlined per CMS and USPSTF guidelines and agreed to schedule her next follow up appointment today.  I, Doreene Nest, am acting as transcriptionist for Eber Jones, MD  I have reviewed the above documentation for accuracy and completeness, and I agree with the above. - Ilene Qua, MD

## 2017-12-22 NOTE — Progress Notes (Deleted)
Angela Stark - 47 y.o. female MRN [id number]  Date of birth: [birth year]  SUBJECTIVE:  Including CC & ROS.  No chief complaint on file.   Angela Stark is a 47 y.o. female that is  ***.  ***   Review of Systems  HISTORY: Past Medical, Surgical, Social, and Family History Reviewed & Updated per EMR.   Pertinent Historical Findings include:  Past Medical History:  Diagnosis Date  . Anemia   . Anxiety   . Back pain   . Depression   . Fatty liver   . GERD (gastroesophageal reflux disease)   . Heart murmur    When pregnant  . Hyperlipidemia   . Hypertension   . Joint pain   . Knee pain   . Muscle stiffness   . Phlebitis   . Shortness of breath   . Shortness of breath on exertion   . Sleep apnea   . Sleep apnea   . Stress   . Swelling of both lower extremities   . Trouble in sleeping     Past Surgical History:  Procedure Laterality Date  . OVARY SURGERY      No Known Allergies  Family History  Problem Relation Age of Onset  . Hypertension Mother   . Hyperlipidemia Mother   . Diabetes Mother   . Emphysema Mother   . Obesity Mother   . Heart disease Father   . Hypertension Father   . Hyperlipidemia Father   . Obesity Father   . Sleep apnea Father   . Alcoholism Father   . Stroke Brother   . Lung cancer Maternal Grandmother      Social History   Socioeconomic History  . Marital status: Married    Spouse name: Angela Stark  . Number of children: 3  . Years of education: 81  . Highest education level: Not on file  Occupational History  . Occupation: Scientist, research (medical): [employer]  Social Needs  . Financial resource strain: Not on file  . Food insecurity:    Worry: Not on file    Inability: Not on file  . Transportation needs:    Medical: Not on file    Non-medical: Not on file  Tobacco Use  . Smoking status: Current Every Day Smoker    Packs/day: 0.50    Years: 20.00    Pack years: 10.00    Types: Cigarettes  . Smokeless tobacco:  Never Used  Substance and Sexual Activity  . Alcohol use: No  . Drug use: No  . Sexual activity: Yes    Birth control/protection: None  Lifestyle  . Physical activity:    Days per week: Not on file    Minutes per session: Not on file  . Stress: Not on file  Relationships  . Social connections:    Talks on phone: Not on file    Gets together: Not on file    Attends religious service: Not on file    Active member of club or organization: Not on file    Attends meetings of clubs or organizations: Not on file    Relationship status: Not on file  . Intimate partner violence:    Fear of current or ex partner: Not on file    Emotionally abused: Not on file    Physically abused: Not on file    Forced sexual activity: Not on file  Other Topics Concern  . Not on file  Social History Narrative   Fun:  Fishing, camping   Denies religious beliefs effecting health care.      PHYSICAL EXAM:  VS: There were no vitals taken for this visit. Physical Exam Gen: NAD, alert, cooperative with exam, well-appearing ENT: normal lips, normal nasal mucosa,  Eye: normal EOM, normal conjunctiva and lids CV:  no edema, +2 pedal pulses   Resp: no accessory muscle use, non-labored,  GI: no masses or tenderness, no hernia  Skin: no rashes, no areas of induration  Neuro: normal tone, normal sensation to touch Psych:  normal insight, alert and oriented MSK:  ***      ASSESSMENT & PLAN:   No problem-specific Assessment & Plan notes found for this encounter.

## 2017-12-23 ENCOUNTER — Encounter: Payer: Self-pay | Admitting: Family Medicine

## 2017-12-23 ENCOUNTER — Ambulatory Visit (INDEPENDENT_AMBULATORY_CARE_PROVIDER_SITE_OTHER): Payer: No Typology Code available for payment source

## 2017-12-23 ENCOUNTER — Ambulatory Visit (INDEPENDENT_AMBULATORY_CARE_PROVIDER_SITE_OTHER): Payer: No Typology Code available for payment source | Admitting: Family Medicine

## 2017-12-23 VITALS — BP 136/74 | HR 82 | Ht 67.0 in | Wt 248.0 lb

## 2017-12-23 DIAGNOSIS — M25561 Pain in right knee: Secondary | ICD-10-CM | POA: Diagnosis not present

## 2017-12-23 DIAGNOSIS — G8929 Other chronic pain: Secondary | ICD-10-CM

## 2017-12-23 DIAGNOSIS — M25562 Pain in left knee: Secondary | ICD-10-CM

## 2017-12-23 NOTE — Patient Instructions (Signed)
Good to see you  We will call you once the gel injections are in  Please try physical therapy  These are supplements to help with inflammation.  Glucosamine sulfate 750mg  twice a day is a supplement that has been shown to help moderate to severe arthritis. Vitamin D 2000 IU daily Fish oil 2 grams daily.  Tumeric 500mg  twice daily.  Capsaicin topically up to four times a day may also help with pain.

## 2017-12-23 NOTE — Assessment & Plan Note (Signed)
Less painful than left. 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  - xrays today  - refer to PT  - try gel injections.

## 2017-12-23 NOTE — Assessment & Plan Note (Addendum)
Pain is ongoing and worse than her right. Mild improvement with steroid injection. 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  - xray today  - PT  - try gel injections.

## 2017-12-24 ENCOUNTER — Telehealth: Payer: Self-pay | Admitting: Family Medicine

## 2017-12-24 NOTE — Telephone Encounter (Signed)
Patient returned call- she was informed of results. Patient has questions about if she is a candidate for injections- please let her know. Can call at anytime- [phone].

## 2017-12-24 NOTE — Telephone Encounter (Signed)
Left VM for patient. If she calls back please have her speak with a nurse/CMA and inform that she has mild degenerative changes in her knees. The [facility] can report results to patient.   If any questions then please take the best time and phone number to call and I will try to call her back.   Myra Rude, MD [facility] 12/24/2017, 8:20 AM

## 2017-12-24 NOTE — Telephone Encounter (Signed)
Can you help with this? Thanks

## 2017-12-27 NOTE — Telephone Encounter (Signed)
Left message on VM.

## 2017-12-31 NOTE — Telephone Encounter (Signed)
Spoke with patient-scheduled appointment for hymovis injections.

## 2017-12-31 NOTE — Telephone Encounter (Signed)
Angela Stark, Pt would like you to call her back regarding her injections. Thanks [phone]

## 2018-01-03 ENCOUNTER — Ambulatory Visit: Payer: Self-pay | Admitting: Family Medicine

## 2018-01-03 NOTE — Progress Notes (Deleted)
Angela Stark - 47 y.o. female MRN [id number]  Date of birth: 05/13/1970  SUBJECTIVE:  Including CC & ROS.  No chief complaint on file.   Angela Stark is a 47 y.o. female that is  ***.  ***   Review of Systems  HISTORY: Past Medical, Surgical, Social, and Family History Reviewed & Updated per EMR.   Pertinent Historical Findings include:  Past Medical History:  Diagnosis Date  . Anemia   . Anxiety   . Back pain   . Depression   . Fatty liver   . GERD (gastroesophageal reflux disease)   . Heart murmur    When pregnant  . Hyperlipidemia   . Hypertension   . Joint pain   . Knee pain   . Muscle stiffness   . Phlebitis   . Shortness of breath   . Shortness of breath on exertion   . Sleep apnea   . Sleep apnea   . Stress   . Swelling of both lower extremities   . Trouble in sleeping     Past Surgical History:  Procedure Laterality Date  . OVARY SURGERY      No Known Allergies  Family History  Problem Relation Age of Onset  . Hypertension Mother   . Hyperlipidemia Mother   . Diabetes Mother   . Emphysema Mother   . Obesity Mother   . Heart disease Father   . Hypertension Father   . Hyperlipidemia Father   . Obesity Father   . Sleep apnea Father   . Alcoholism Father   . Stroke Brother   . Lung cancer Maternal Grandmother      Social History   Socioeconomic History  . Marital status: Married    Spouse name: David  . Number of children: 3  . Years of education: 14  . Highest education level: Not on file  Occupational History  . Occupation: CNA    Employer: [employer]  Social Needs  . Financial resource strain: Not on file  . Food insecurity:    Worry: Not on file    Inability: Not on file  . Transportation needs:    Medical: Not on file    Non-medical: Not on file  Tobacco Use  . Smoking status: Current Every Day Smoker    Packs/day: 0.50    Years: 20.00    Pack years: 10.00    Types: Cigarettes  . Smokeless tobacco:  Never Used  Substance and Sexual Activity  . Alcohol use: No  . Drug use: No  . Sexual activity: Yes    Birth control/protection: None  Lifestyle  . Physical activity:    Days per week: Not on file    Minutes per session: Not on file  . Stress: Not on file  Relationships  . Social connections:    Talks on phone: Not on file    Gets together: Not on file    Attends religious service: Not on file    Active member of club or organization: Not on file    Attends meetings of clubs or organizations: Not on file    Relationship status: Not on file  . Intimate partner violence:    Fear of current or ex partner: Not on file    Emotionally abused: Not on file    Physically abused: Not on file    Forced sexual activity: Not on file  Other Topics Concern  . Not on file  Social History Narrative   Fun:   Fishing, camping   Denies religious beliefs effecting health care.      PHYSICAL EXAM:  VS: There were no vitals taken for this visit. Physical Exam Gen: NAD, alert, cooperative with exam, well-appearing ENT: normal lips, normal nasal mucosa,  Eye: normal EOM, normal conjunctiva and lids CV:  no edema, +2 pedal pulses   Resp: no accessory muscle use, non-labored,  GI: no masses or tenderness, no hernia  Skin: no rashes, no areas of induration  Neuro: normal tone, normal sensation to touch Psych:  normal insight, alert and oriented MSK:  ***      ASSESSMENT & PLAN:   No problem-specific Assessment & Plan notes found for this encounter.   

## 2018-01-05 ENCOUNTER — Ambulatory Visit (INDEPENDENT_AMBULATORY_CARE_PROVIDER_SITE_OTHER): Payer: No Typology Code available for payment source | Admitting: Family Medicine

## 2018-01-05 VITALS — BP 126/79 | HR 66 | Temp 98.2°F | Ht 67.0 in | Wt 245.0 lb

## 2018-01-05 DIAGNOSIS — Z6838 Body mass index (BMI) 38.0-38.9, adult: Secondary | ICD-10-CM | POA: Diagnosis not present

## 2018-01-05 DIAGNOSIS — I10 Essential (primary) hypertension: Secondary | ICD-10-CM | POA: Diagnosis not present

## 2018-01-05 DIAGNOSIS — E559 Vitamin D deficiency, unspecified: Secondary | ICD-10-CM

## 2018-01-10 NOTE — Progress Notes (Signed)
Office: [phone]  /  Fax: [phone]   HPI:   Chief Complaint: OBESITY Angela Stark is here to discuss her progress with her obesity treatment plan. She is on the Category 4 plan and is following her eating plan approximately 50 % of the time. She states she is doing weight training for 15 minutes 3 times per week. Angela Stark has noticed a struggle with meal prep and motivation in the past few weeks. She is trying to get back on track with meal planning.  Her weight is 245 lb (111.1 kg) today and has had a weight loss of 1 pound over a period of 2 weeks since her last visit. She has lost 7 lbs since starting treatment with [place].  Hypertension Angela Stark is a 47 y.o. female with hypertension. Angela Stark's blood pressure is controlled and she denies chest pain, chest pressure, or headaches. She is working weight loss to help control her blood pressure with the goal of decreasing her risk of heart attack and stroke.   Vitamin D Deficiency Angela Stark has a diagnosis of vitamin D deficiency. She is currently taking prescription Vit D. She notes improving fatigue and denies nausea, vomiting or muscle weakness.  ALLERGIES: No Known Allergies  MEDICATIONS: Current Outpatient Medications on File Prior to Visit  Medication Sig Dispense Refill  . amLODipine (NORVASC) 10 MG tablet Take 1 tablet (10 mg total) by mouth daily. 90 tablet 0  . buPROPion (WELLBUTRIN XL) 150 MG 24 hr tablet Take 1 tablet (150 mg total) by mouth daily. 90 tablet 0  . chlorthalidone (HYGROTON) 25 MG tablet Take 1 tablet (25 mg total) by mouth daily. 100 tablet 0  . Diclofenac Sodium (PENNSAID) 2 % SOLN Place 1 application onto the skin 2 (two) times daily. 1 Bottle 3  . varenicline (CHANTIX PAK) 0.5 MG X 11 & 1 MG X 42 tablet Take one 0.5 mg tablet by mouth once daily for 3 days, then increase to one 0.5 mg tablet twice daily for 4 days, then increase to one 1 mg tablet twice daily. 53 tablet 0  . Vitamin D,  Ergocalciferol, (DRISDOL) 50000 units CAPS capsule Take 1 capsule (50,000 Units total) by mouth every 7 (seven) days. 4 capsule 0   No current facility-administered medications on file prior to visit.     PAST MEDICAL HISTORY: Past Medical History:  Diagnosis Date  . Anemia   . Anxiety   . Back pain   . Depression   . Fatty liver   . GERD (gastroesophageal reflux disease)   . Heart murmur    When pregnant  . Hyperlipidemia   . Hypertension   . Joint pain   . Knee pain   . Muscle stiffness   . Phlebitis   . Shortness of breath   . Shortness of breath on exertion   . Sleep apnea   . Sleep apnea   . Stress   . Swelling of both lower extremities   . Trouble in sleeping     PAST SURGICAL HISTORY: Past Surgical History:  Procedure Laterality Date  . OVARY SURGERY      SOCIAL HISTORY: Social History   Tobacco Use  . Smoking status: Current Every Day Smoker    Packs/day: 0.50    Years: 20.00    Pack years: 10.00    Types: Cigarettes  . Smokeless tobacco: Never Used  Substance Use Topics  . Alcohol use: No  . Drug use: No    FAMILY HISTORY: Family History  Problem Relation Age of Onset  . Hypertension Mother   . Hyperlipidemia Mother   . Diabetes Mother   . Emphysema Mother   . Obesity Mother   . Heart disease Father   . Hypertension Father   . Hyperlipidemia Father   . Obesity Father   . Sleep apnea Father   . Alcoholism Father   . Stroke Brother   . Lung cancer Maternal Grandmother     ROS: Review of Systems  Constitutional: Positive for malaise/fatigue and weight loss.  Cardiovascular: Negative for chest pain.       Negative chest pressure  Gastrointestinal: Negative for nausea and vomiting.  Musculoskeletal:       Negative muscle weakness  Neurological: Negative for headaches.    PHYSICAL EXAM: Blood pressure 126/79, pulse 66, temperature 98.2 F (36.8 C), temperature source Oral, height 5\' 7"  (1.702 m), weight 245 lb (111.1 kg), SpO2 98  %. Body mass index is 38.37 kg/m. Physical Exam  Constitutional: She is oriented to person, place, and time. She appears well-developed and well-nourished.  Cardiovascular: Normal rate.  Pulmonary/Chest: Effort normal.  Musculoskeletal: Normal range of motion.  Neurological: She is oriented to person, place, and time.  Skin: Skin is warm and dry.  Psychiatric: She has a normal mood and affect. Her behavior is normal.  Vitals reviewed.   RECENT LABS AND TESTS: BMET    Component Value Date/Time   NA 138 10/18/2017 1141   K 3.9 10/18/2017 1141   CL 92 (L) 10/18/2017 1141   CO2 29 10/18/2017 1141   GLUCOSE 70 10/18/2017 1141   GLUCOSE 107 (H) 09/06/2017 0919   BUN 12 10/18/2017 1141   CREATININE 0.60 10/18/2017 1141   CALCIUM 9.1 10/18/2017 1141   GFRNONAA 109 10/18/2017 1141   GFRAA 126 10/18/2017 1141   Lab Results  Component Value Date   HGBA1C 5.3 10/18/2017   Lab Results  Component Value Date   INSULIN 4.2 10/18/2017   CBC    Component Value Date/Time   WBC 7.1 10/18/2017 1141   WBC 5.3 05/28/2017 1144   RBC 4.70 10/18/2017 1141   RBC 4.66 05/28/2017 1144   HGB 15.0 10/18/2017 1141   HCT 46.3 10/18/2017 1141   PLT 218.0 05/28/2017 1144   MCV 99 (H) 10/18/2017 1141   MCH 31.9 10/18/2017 1141   MCH 35.6 (H) 08/20/2011 0445   MCHC 32.4 10/18/2017 1141   MCHC 33.6 05/28/2017 1144   RDW 13.9 10/18/2017 1141   LYMPHSABS 2.1 10/18/2017 1141   MONOABS 0.4 08/17/2011 2140   EOSABS 0.1 10/18/2017 1141   BASOSABS 0.0 10/18/2017 1141   Iron/TIBC/Ferritin/ %Sat No results found for: IRON, TIBC, FERRITIN, IRONPCTSAT Lipid Panel     Component Value Date/Time   CHOL 233 (H) 10/18/2017 1141   TRIG 253 (H) 10/18/2017 1141   HDL 53 10/18/2017 1141   CHOLHDL 4 05/28/2017 1144   VLDL 50.0 (H) 05/28/2017 1144   LDLCALC 129 (H) 10/18/2017 1141   LDLDIRECT 108.0 05/28/2017 1144   Hepatic Function Panel     Component Value Date/Time   PROT 7.3 10/18/2017 1141    ALBUMIN 4.6 10/18/2017 1141   AST 35 10/18/2017 1141   ALT 34 (H) 10/18/2017 1141   ALKPHOS 55 10/18/2017 1141   BILITOT 0.4 10/18/2017 1141   BILIDIR 0.4 (H) 07/30/2015 0957      Component Value Date/Time   TSH 2.230 10/18/2017 1141   TSH 2.37 05/28/2017 1144   TSH 3.80 12/13/2014 0912  Results for AIMAN, SONN (MRN [id number]) as of 01/10/2018 09:07  Ref. Range 10/18/2017 11:41  Vitamin D, 25-Hydroxy Latest Ref Range: 30.0 - 100.0 ng/mL 18.0 (L)    ASSESSMENT AND PLAN: Essential hypertension  Vitamin D deficiency  Class 2 severe obesity with serious comorbidity and body mass index (BMI) of 38.0 to 38.9 in adult, unspecified obesity type (HCC)  PLAN:  Hypertension We discussed sodium restriction, working on healthy weight loss, and a regular exercise program as the means to achieve improved blood pressure control. Angela Stark agreed with this plan and agreed to follow up as directed. We will continue to monitor her blood pressure as well as her progress with the above lifestyle modifications. Angela Stark agrees to continue taking amlodipine and chlorthalidone, and will watch for signs of hypotension as she continues her lifestyle modifications. Angela Stark agrees to follow up with our clinic in 2 weeks.  Vitamin D Deficiency Angela Stark was informed that low vitamin D levels contributes to fatigue and are associated with obesity, breast, and colon cancer. Cerys agrees to continue taking prescription Vit D @50 ,000 IU every week and will follow up for routine testing of vitamin D, at least 2-3 times per year. She was informed of the risk of over-replacement of vitamin D and agrees to not increase her dose unless she discusses this with [place] first. Angela Stark agrees to follow up with our clinic in 2 weeks.  I spent > than 50% of the 15 minute visit on counseling as documented in the note.  Obesity Angela Stark is currently in the action stage of change. As such, her goal is to continue  with weight loss efforts She has agreed to follow the Category 4 plan Angela Stark has been instructed to work up to a goal of 150 minutes of combined cardio and strengthening exercise per week for weight loss and overall health benefits. We discussed the following Behavioral Modification Strategies today: decreasing simple carbohydrates, work on meal planning and easy cooking plans, and planning for success   Angela Stark has agreed to follow up with our clinic in 2 weeks. She was informed of the importance of frequent follow up visits to maximize her success with intensive lifestyle modifications for her multiple health conditions.   OBESITY BEHAVIORAL INTERVENTION VISIT  Today's visit was # 5   Starting weight: 252 lbs Starting date: 10/18/17 Today's weight : 245 lbs  Today's date: 01/05/2018 Total lbs lost to date: 7    ASK: We discussed the diagnosis of obesity with Angela Stark today and Angela Stark agreed to give [place] permission to discuss obesity behavioral modification therapy today.  ASSESS: Angela Stark has the diagnosis of obesity and her BMI today is 38.36 Angela Stark is in the action stage of change   ADVISE: Angela Stark was educated on the multiple health risks of obesity as well as the benefit of weight loss to improve her health. She was advised of the need for long term treatment and the importance of lifestyle modifications to improve her current health and to decrease her risk of future health problems.  AGREE: Multiple dietary modification options and treatment options were discussed and  Angela Stark agreed to follow the recommendations documented in the above note.  ARRANGE: Angela Stark was educated on the importance of frequent visits to treat obesity as outlined per CMS and USPSTF guidelines and agreed to schedule her next follow up appointment today.  I, Burt Knack, am acting as transcriptionist for Debbra Riding, MD  I have reviewed the above documentation  for accuracy and  completeness, and I agree with the above. - Ilene Qua, MD

## 2018-01-11 NOTE — Progress Notes (Addendum)
Angela Stark - 47 y.o. female MRN [id number]  Date of birth: Aug 24, 1970  SUBJECTIVE:  Including CC & ROS.  Chief Complaint  Patient presents with  . Knee Pain    Angela Stark is a 47 y.o. female that is here for bilateral knee pain. Pain has not improved. Denies changes in her health.  The pain is localized to the knee.  Pain is experienced in the medial component.  She has some swelling of the left knee.  Denies any locking or giving way.  There is received steroid injections with little to no improvement.  Pain is moderate in severity.  Pain is intermittent.  Pain is worse at the end of the day when she has been walking.  I, Milas Kocher, CMA serving as a Neurosurgeon for [employer]   Independent review of the left and right knee x-ray from 10/3 shows mild tricompartmental degenerative changes.   Review of Systems  Constitutional: Negative for fever.  HENT: Negative for congestion.   Respiratory: Negative for cough.   Cardiovascular: Negative for chest pain.  Gastrointestinal: Negative for abdominal pain.  Musculoskeletal: Positive for arthralgias and joint swelling.  Skin: Negative for color change.  Neurological: Negative for weakness.  Hematological: Negative for adenopathy.  Psychiatric/Behavioral: Negative for agitation.    HISTORY: Past Medical, Surgical, Social, and Family History Reviewed & Updated per EMR.   Pertinent Historical Findings include:  Past Medical History:  Diagnosis Date  . Anemia   . Anxiety   . Back pain   . Depression   . Fatty liver   . GERD (gastroesophageal reflux disease)   . Heart murmur    When pregnant  . Hyperlipidemia   . Hypertension   . Joint pain   . Knee pain   . Muscle stiffness   . Phlebitis   . Shortness of breath   . Shortness of breath on exertion   . Stark apnea   . Stark apnea   . Stress   . Swelling of both lower extremities   . Trouble in sleeping     Past Surgical History:  Procedure  Laterality Date  . OVARY SURGERY      No Known Allergies  Family History  Problem Relation Age of Onset  . Hypertension Mother   . Hyperlipidemia Mother   . Diabetes Mother   . Emphysema Mother   . Obesity Mother   . Heart disease Father   . Hypertension Father   . Hyperlipidemia Father   . Obesity Father   . Stark apnea Father   . Alcoholism Father   . Stroke Brother   . Lung cancer Maternal Grandmother      Social History   Socioeconomic History  . Marital status: Married    Spouse name: Onalee Hua  . Number of children: 3  . Years of education: 60  . Highest education level: Not on file  Occupational History  . Occupation: Scientist, research (medical): [employer]  Social Needs  . Financial resource strain: Not on file  . Food insecurity:    Worry: Not on file    Inability: Not on file  . Transportation needs:    Medical: Not on file    Non-medical: Not on file  Tobacco Use  . Smoking status: Current Every Day Smoker    Packs/day: 0.50    Years: 20.00    Pack years: 10.00    Types: Cigarettes  . Smokeless tobacco: Never Used  Substance and Sexual  Activity  . Alcohol use: No  . Drug use: No  . Sexual activity: Yes    Birth control/protection: None  Lifestyle  . Physical activity:    Days per week: Not on file    Minutes per session: Not on file  . Stress: Not on file  Relationships  . Social connections:    Talks on phone: Not on file    Gets together: Not on file    Attends religious service: Not on file    Active member of club or organization: Not on file    Attends meetings of clubs or organizations: Not on file    Relationship status: Not on file  . Intimate partner violence:    Fear of current or ex partner: Not on file    Emotionally abused: Not on file    Physically abused: Not on file    Forced sexual activity: Not on file  Other Topics Concern  . Not on file  Social History Narrative   Fun: Fishing, camping   Denies religious beliefs effecting  health care.      PHYSICAL EXAM:  VS: BP 130/78   Pulse 90   Ht 5\' 7"  (1.702 m)   Wt 246 lb (111.6 kg)   SpO2 98%   BMI 38.53 kg/m  Physical Exam Gen: NAD, alert, cooperative with exam, well-appearing ENT: normal lips, normal nasal mucosa,  Eye: normal EOM, normal conjunctiva and lids CV:  no edema, +2 pedal pulses   Resp: no accessory muscle use, non-labored,  Skin: no rashes, no areas of induration  Neuro: normal tone, normal sensation to touch Psych:  normal insight, alert and oriented MSK:  Left and right knee: Mild effusion in left knee. Normal range of motion. Normal strength resistance. Some instability with valgus testing. Negative Murray's testing. No pain with patellar grind. Neurovascular intact   Aspiration/Injection Procedure Note Angela Stark Apr 05, 1970  Procedure: Aspiration and Injection Indications: left knee pain   Procedure Details Consent: Risks of procedure as well as the alternatives and risks of each were explained to the (patient/caregiver).  Consent for procedure obtained. Time Out: Verified patient identification, verified procedure, site/side was marked, verified correct patient position, special equipment/implants available, medications/allergies/relevent history reviewed, required imaging and test results available.  Performed.  The area was cleaned with iodine and alcohol swabs.    The left knee superior lateral suprapatellar pouch was injected 3 cc's of 1% lidocaine with a 22 1 1/2" needle.  An 18-gauge inch and half needle was then inserted for aspiration.  The syringe was then switched and a mixture 24 mg/8mL Hymovis was injected ultrasound was used. Images were obtained in Long views showing the injection.    Amount of Fluid Aspirated: 15mL Character of Fluid: clear and straw colored Fluid was sent for:n/a A sterile dressing was applied.  Patient did tolerate procedure well.   Aspiration/Injection Procedure Note Angela Stark 1971/01/20  Procedure: Injection Indications: right knee pain   Procedure Details Consent: Risks of procedure as well as the alternatives and risks of each were explained to the (patient/caregiver).  Consent for procedure obtained. Time Out: Verified patient identification, verified procedure, site/side was marked, verified correct patient position, special equipment/implants available, medications/allergies/relevent history reviewed, required imaging and test results available.  Performed.  The area was cleaned with iodine and alcohol swabs.    The right knee superior lateral suprapatellar pouch was injected using 3 cc's of 1% lidocaine with a 22 1 1/2" needle.  The  syringe was then switched and a mixture containing 24 mg/3 mL Hymovis was then injected.  ultrasound was used. Images were obtained in Long views showing the injection.    A sterile dressing was applied.  Patient did tolerate procedure well.     ASSESSMENT & PLAN:   Primary osteoarthritis of both knees Pain could be associated with degenerative changes seen on x-ray.  Had limited improvement with steroid. - try hymovis bilaterally. Patient supplied - counseled on supportive care  - f/u in one week for second series of injections.    The above documentation has been reviewed and is accurate and complete. Clare Gandy, MD 01/25/2018, 1:37 PM>

## 2018-01-12 ENCOUNTER — Ambulatory Visit (INDEPENDENT_AMBULATORY_CARE_PROVIDER_SITE_OTHER): Payer: No Typology Code available for payment source | Admitting: Family Medicine

## 2018-01-12 ENCOUNTER — Encounter: Payer: Self-pay | Admitting: Family Medicine

## 2018-01-12 ENCOUNTER — Ambulatory Visit: Payer: Self-pay

## 2018-01-12 VITALS — BP 130/78 | HR 90 | Ht 67.0 in | Wt 246.0 lb

## 2018-01-12 DIAGNOSIS — M25561 Pain in right knee: Secondary | ICD-10-CM

## 2018-01-12 DIAGNOSIS — M170 Bilateral primary osteoarthritis of knee: Secondary | ICD-10-CM

## 2018-01-12 DIAGNOSIS — M25562 Pain in left knee: Principal | ICD-10-CM

## 2018-01-12 HISTORY — DX: Bilateral primary osteoarthritis of knee: M17.0

## 2018-01-12 NOTE — Patient Instructions (Signed)
Good to see you  Please see me back in one week  Please try to ice if needed

## 2018-01-12 NOTE — Assessment & Plan Note (Addendum)
Pain could be associated with degenerative changes seen on x-ray.  Had limited improvement with steroid. - try hymovis bilaterally. Patient supplied - counseled on supportive care  - f/u in one week for second series of injections.

## 2018-01-19 ENCOUNTER — Ambulatory Visit (INDEPENDENT_AMBULATORY_CARE_PROVIDER_SITE_OTHER): Payer: No Typology Code available for payment source | Admitting: Physician Assistant

## 2018-01-19 ENCOUNTER — Encounter (INDEPENDENT_AMBULATORY_CARE_PROVIDER_SITE_OTHER): Payer: Self-pay

## 2018-01-20 ENCOUNTER — Encounter: Payer: Self-pay | Admitting: Family Medicine

## 2018-01-20 ENCOUNTER — Ambulatory Visit (INDEPENDENT_AMBULATORY_CARE_PROVIDER_SITE_OTHER): Payer: No Typology Code available for payment source | Admitting: Family Medicine

## 2018-01-20 ENCOUNTER — Ambulatory Visit: Payer: Self-pay

## 2018-01-20 VITALS — BP 112/82 | HR 75 | Temp 98.5°F | Ht 67.0 in | Wt 246.0 lb

## 2018-01-20 DIAGNOSIS — M170 Bilateral primary osteoarthritis of knee: Secondary | ICD-10-CM

## 2018-01-20 NOTE — Patient Instructions (Signed)
Good to see you  Please continue to ice  Please see me back in 3-4 weeks if you would like.

## 2018-01-20 NOTE — Progress Notes (Addendum)
KIRBIE STODGHILL - 47 y.o. female MRN [id number]  Date of birth: 25-Mar-1970  SUBJECTIVE:  Including CC & ROS.  Chief Complaint  Patient presents with  . Follow-up    Pt sts the pain in her knees have improved since injection. Pt has not had to take over the counter medications for pain. Pt pain level 2/10 w/ movement.     MERISA JULIO is a 47 y.o. female that is is presenting for her second and final series of hymovis.   Review of Systems  HISTORY: Past Medical, Surgical, Social, and Family History Reviewed & Updated per EMR.   Pertinent Historical Findings include:  Past Medical History:  Diagnosis Date  . Anemia   . Anxiety   . Back pain   . Depression   . Fatty liver   . GERD (gastroesophageal reflux disease)   . Heart murmur    When pregnant  . Hyperlipidemia   . Hypertension   . Joint pain   . Knee pain   . Muscle stiffness   . Phlebitis   . Shortness of breath   . Shortness of breath on exertion   . Sleep apnea   . Sleep apnea   . Stress   . Swelling of both lower extremities   . Trouble in sleeping     Past Surgical History:  Procedure Laterality Date  . OVARY SURGERY      No Known Allergies  Family History  Problem Relation Age of Onset  . Hypertension Mother   . Hyperlipidemia Mother   . Diabetes Mother   . Emphysema Mother   . Obesity Mother   . Heart disease Father   . Hypertension Father   . Hyperlipidemia Father   . Obesity Father   . Sleep apnea Father   . Alcoholism Father   . Stroke Brother   . Lung cancer Maternal Grandmother      Social History   Socioeconomic History  . Marital status: Married    Spouse name: Onalee Hua  . Number of children: 3  . Years of education: 5  . Highest education level: Not on file  Occupational History  . Occupation: Scientist, research (medical): [employer]  Social Needs  . Financial resource strain: Not on file  . Food insecurity:    Worry: Not on file    Inability: Not on file  .  Transportation needs:    Medical: Not on file    Non-medical: Not on file  Tobacco Use  . Smoking status: Current Every Day Smoker    Packs/day: 0.50    Years: 20.00    Pack years: 10.00    Types: Cigarettes  . Smokeless tobacco: Never Used  Substance and Sexual Activity  . Alcohol use: No  . Drug use: No  . Sexual activity: Yes    Birth control/protection: None  Lifestyle  . Physical activity:    Days per week: Not on file    Minutes per session: Not on file  . Stress: Not on file  Relationships  . Social connections:    Talks on phone: Not on file    Gets together: Not on file    Attends religious service: Not on file    Active member of club or organization: Not on file    Attends meetings of clubs or organizations: Not on file    Relationship status: Not on file  . Intimate partner violence:    Fear of current or ex partner:  Not on file    Emotionally abused: Not on file    Physically abused: Not on file    Forced sexual activity: Not on file  Other Topics Concern  . Not on file  Social History Narrative   Fun: Fishing, camping   Denies religious beliefs effecting health care.      PHYSICAL EXAM:  VS: BP 112/82 (BP Location: Right Arm, Patient Position: Sitting, Cuff Size: Normal)   Pulse 75   Temp 98.5 F (36.9 C) (Oral)   Ht 5\' 7"  (1.702 m)   Wt 246 lb (111.6 kg)   SpO2 95%   BMI 38.53 kg/m  Physical Exam Gen: NAD, alert, cooperative with exam, well-appearing   Aspiration/Injection Procedure Note DANYELE SMEJKAL 1970-10-02  Procedure: Aspiration and Injection Indications: left knee pain   Procedure Details Consent: Risks of procedure as well as the alternatives and risks of each were explained to the (patient/caregiver).  Consent for procedure obtained. Time Out: Verified patient identification, verified procedure, site/side was marked, verified correct patient position, special equipment/implants available, medications/allergies/relevent  history reviewed, required imaging and test results available.  Performed.  The area was cleaned with iodine and alcohol swabs.    The left knee superior lateral suprapatellar pouch was injected using 4 cc's of 1% lidocaine with a 22 1 1/2" needle.  An 18-gauge needle inch and half was inserted to achieve aspiration.  The syringe was then switched and an injectate of 24 mg/3 mL hymovis was injected. Ultrasound was used. Images were obtained in Long views showing the injection.    Amount of Fluid Aspirated: 20mL Character of Fluid: bloody and gelatinous Fluid was sent ZOX:WRUE count, crystal identification, protein and glucose A sterile dressing was applied.  Patient did tolerate procedure well.   Aspiration/Injection Procedure Note SHANEKQUA SCHAPER Dec 24, 1970  Procedure: Injection Indications: Right knee pain  Procedure Details Consent: Risks of procedure as well as the alternatives and risks of each were explained to the (patient/caregiver).  Consent for procedure obtained. Time Out: Verified patient identification, verified procedure, site/side was marked, verified correct patient position, special equipment/implants available, medications/allergies/relevent history reviewed, required imaging and test results available.  Performed.  The area was cleaned with iodine and alcohol swabs.    The right knee superior lateral suprapatellar pouch was injected using 4 cc's of 1% lidocaine with a 22 1 1/2" needle.  The syringe was then switched out and an injectate of 24 mg/3 mL hymovis was injected. Ultrasound was used. Images were obtained in Long views showing the injection.    A sterile dressing was applied.  Patient did tolerate procedure well.       ASSESSMENT & PLAN:   Primary osteoarthritis of both knees Completed the series of Hymovis. Patient supplied hymovis.  -Aspiration of the left knee.  Had a bloody tinge and was sent for analysis. - can follow up in 3-4 weeks.

## 2018-01-20 NOTE — Assessment & Plan Note (Addendum)
Completed the series of Hymovis. Patient supplied hymovis.  -Aspiration of the left knee.  Had a bloody tinge and was sent for analysis. - can follow up in 3-4 weeks.

## 2018-01-21 LAB — SYNOVIAL CELL COUNT + DIFF, W/ CRYSTALS
Basophils, %: 2 % — ABNORMAL HIGH
Eosinophils-Synovial: 6 % — ABNORMAL HIGH (ref 0–2)
Lymphocytes-Synovial Fld: 50 % (ref 0–74)
Monocyte/Macrophage: 12 % (ref 0–69)
Neutrophil, Synovial: 30 % — ABNORMAL HIGH (ref 0–24)
SYNOVIOCYTES, %: 0 % (ref 0–15)
WBC, SYNOVIAL: 240 {cells}/uL — AB (ref <150)

## 2018-01-25 ENCOUNTER — Encounter (INDEPENDENT_AMBULATORY_CARE_PROVIDER_SITE_OTHER): Payer: Self-pay | Admitting: Family Medicine

## 2018-01-25 ENCOUNTER — Telehealth: Payer: Self-pay | Admitting: Family Medicine

## 2018-01-25 NOTE — Telephone Encounter (Signed)
Pt. Given results and verbalizes understanding. 

## 2018-01-25 NOTE — Telephone Encounter (Signed)
Left VM for patient. If she calls back please have her speak with a nurse/CMA and inform that her cell analysis didn't show an infection. The [facility] can report results to patient.   If any questions then please take the best time and phone number to call and I will try to call her back.   Myra Rude, MD [facility] 01/25/2018, 9:48 AM

## 2018-02-23 ENCOUNTER — Telehealth: Payer: Self-pay

## 2018-02-23 NOTE — Addendum Note (Signed)
Addended by: Nadene RubinsKREMER, Oletta Buehring A on: 02/23/2018 11:22 AM   Modules accepted: Orders

## 2018-02-23 NOTE — Telephone Encounter (Signed)
Have referred patient to orthopedic for bilateral knee pain.

## 2018-02-23 NOTE — Telephone Encounter (Signed)
Copied from CRM [id number]. Topic: Referral - Request for Referral >> Feb 23, 2018 10:07 AM Lynne LoganHudson, Caryn D wrote: Has patient seen PCP for this complaint? Yes *If NO, is insurance requiring patient see PCP for this issue before PCP can refer them? Referral for which specialty: Orthopedic Specialist Pref[facility] provider/office: [facility] (As long as they are in network with [facility]) or a high level Orthopedic Specialist Reason for referral: Pain in knees after walking/while lying down

## 2018-02-23 NOTE — Telephone Encounter (Signed)
Referral has been placed. 

## 2018-02-24 NOTE — Telephone Encounter (Signed)
Referral team can we help the pt on this request. Please help.

## 2018-02-24 NOTE — Telephone Encounter (Signed)
I have updated referral to [facility] per patient request. I have called and spoke to patient and informed her that I received message and I was processing the referral and faxing the referral to them today. Patient aware.

## 2018-02-24 NOTE — Telephone Encounter (Signed)
Pt called to see if referral could be changed to [facility]. She stated she has tried to get in contact with the current place she was referred to but she cannot get them on the phone and she would prefer to go with [facility]. Please advise.

## 2018-03-01 MED FILL — MELOXICAM 15 MG TABLET: 15 | 30 days supply | Qty: 30 | Fill #0

## 2018-03-31 ENCOUNTER — Ambulatory Visit (INDEPENDENT_AMBULATORY_CARE_PROVIDER_SITE_OTHER): Payer: No Typology Code available for payment source

## 2018-03-31 ENCOUNTER — Encounter: Payer: Self-pay | Admitting: Family Medicine

## 2018-03-31 ENCOUNTER — Ambulatory Visit (INDEPENDENT_AMBULATORY_CARE_PROVIDER_SITE_OTHER): Payer: No Typology Code available for payment source | Admitting: Family Medicine

## 2018-03-31 VITALS — BP 148/96 | HR 63 | Temp 98.2°F | Ht 67.0 in | Wt 246.0 lb

## 2018-03-31 DIAGNOSIS — M170 Bilateral primary osteoarthritis of knee: Secondary | ICD-10-CM

## 2018-03-31 NOTE — Progress Notes (Signed)
[name] - 48 y.o. female MRN [id number]  Date of birth: 1970/05/21  SUBJECTIVE:  Including CC & ROS.  Chief Complaint  Patient presents with  . Knee Pain    fluid around right knee    Angela Stark is a 48 y.o. female that is  Presenting with right knee pain and swelling. She last received a steroid injection in December. She also finished gel injections in October. Denies any inciting event or trauma. Pain is occurring over the medial joint line. Has tried pennsaid in the past. Denies any locking or giving way. Has pain after walking. Has trouble with putting on her pants and socks. Pain is moderate to severe.     Review of Systems  Constitutional: Negative for fever.  HENT: Negative for congestion.   Respiratory: Negative for cough.   Cardiovascular: Negative for chest pain.  Gastrointestinal: Negative for abdominal pain.  Musculoskeletal: Positive for arthralgias and joint swelling.  Skin: Negative for color change.  Neurological: Negative for weakness.  Hematological: Negative for adenopathy.  Psychiatric/Behavioral: Negative for agitation.    HISTORY: Past Medical, Surgical, Social, and Family History Reviewed & Updated per EMR.   Pertinent Historical Findings include:  Past Medical History:  Diagnosis Date  . Anemia   . Anxiety   . Back pain   . Depression   . Fatty liver   . GERD (gastroesophageal reflux disease)   . Heart murmur    When pregnant  . Hyperlipidemia   . Hypertension   . Joint pain   . Knee pain   . Muscle stiffness   . Phlebitis   . Shortness of breath   . Shortness of breath on exertion   . Sleep apnea   . Sleep apnea   . Stress   . Swelling of both lower extremities   . Trouble in sleeping     Past Surgical History:  Procedure Laterality Date  . OVARY SURGERY      No Known Allergies  Family History  Problem Relation Age of Onset  . Hypertension Mother   . Hyperlipidemia Mother   . Diabetes Mother   .  Emphysema Mother   . Obesity Mother   . Heart disease Father   . Hypertension Father   . Hyperlipidemia Father   . Obesity Father   . Sleep apnea Father   . Alcoholism Father   . Stroke Brother   . Lung cancer Maternal Grandmother      Social History   Socioeconomic History  . Marital status: Married    Spouse name: Angela Stark  . Number of children: 3  . Years of education: 60  . Highest education level: Not on file  Occupational History  . Occupation: Scientist, research (medical): [employer]  Social Needs  . Financial resource strain: Not on file  . Food insecurity:    Worry: Not on file    Inability: Not on file  . Transportation needs:    Medical: Not on file    Non-medical: Not on file  Tobacco Use  . Smoking status: Current Every Day Smoker    Packs/day: 0.50    Years: 20.00    Pack years: 10.00    Types: Cigarettes  . Smokeless tobacco: Never Used  Substance and Sexual Activity  . Alcohol use: No  . Drug use: No  . Sexual activity: Yes    Birth control/protection: None  Lifestyle  . Physical activity:    Days per week: Not on  file    Minutes per session: Not on file  . Stress: Not on file  Relationships  . Social connections:    Talks on phone: Not on file    Gets together: Not on file    Attends religious service: Not on file    Active member of club or organization: Not on file    Attends meetings of clubs or organizations: Not on file    Relationship status: Not on file  . Intimate partner violence:    Fear of current or ex partner: Not on file    Emotionally abused: Not on file    Physically abused: Not on file    Forced sexual activity: Not on file  Other Topics Concern  . Not on file  Social History Narrative   Fun: Fishing, camping   Denies religious beliefs effecting health care.      PHYSICAL EXAM:  VS: BP (!) 148/96   Pulse 63   Temp 98.2 F (36.8 C) (Oral)   Ht 5\' 7"  (1.702 m)   Wt 246 lb (111.6 kg)   SpO2 95%   BMI 38.53 kg/m  Physical  Exam Gen: NAD, alert, cooperative with exam, well-appearing ENT: normal lips, normal nasal mucosa,  Eye: normal EOM, normal conjunctiva and lids CV:  no edema, +2 pedal pulses   Resp: no accessory muscle use, non-labored,  Skin: no rashes, no areas of induration  Neuro: normal tone, normal sensation to touch Psych:  normal insight, alert and oriented MSK:  Right knee:  Obvious effusion  Limited ROM in flexion and extension  Normal strength to resistance  Neurovascularly intact    Aspiration/Injection Procedure Note VERDINE DRING 1971-01-01  Procedure: Aspiration and Injection Indications: right knee pain   Procedure Details Consent: Risks of procedure as well as the alternatives and risks of each were explained to the (patient/caregiver).  Consent for procedure obtained. Time Out: Verified patient identification, verified procedure, site/side was marked, verified correct patient position, special equipment/implants available, medications/allergies/relevent history reviewed, required imaging and test results available.  Performed.  The area was cleaned with iodine and alcohol swabs.    The right knee superior lateral suprapatellar pouch was injected using 4 cc's of 1% lidocaine on a 22-gauge inch and a half needle to anesthetize the skin in the tract.  An 18-gauge inch and a half needle was inserted for aspiration.  The syringe was then switched and a mixture containing 4 cc's of 0.25% bupivacaine and 1 cc of 30 mg Toradol was injected.  Ultrasound was used. Images were obtained in long views showing the injection.    Amount of Fluid Aspirated: 52mL Character of Fluid: clear and straw colored Fluid was sent for:n/a  A sterile dressing was applied.  Patient did tolerate procedure well.        ASSESSMENT & PLAN:   Primary osteoarthritis of both knees Acute on chronic exacerbation of her right knee pain.  Has completed gel injections a couple months ago.  No recent  steroid injection. -Aspiration and injection today. -Counseled on home exercise therapy and supportive care. -Provided Pennsaid samples. -Could consider physical therapy at some point.

## 2018-03-31 NOTE — Assessment & Plan Note (Signed)
Acute on chronic exacerbation of her right knee pain.  Has completed gel injections a couple months ago.  No recent steroid injection. -Aspiration and injection today. -Counseled on home exercise therapy and supportive care. -Provided Pennsaid samples. -Could consider physical therapy at some point.

## 2018-03-31 NOTE — Patient Instructions (Signed)
Good to see you  Please try physical therapy  Please try ice on the knees  Please try the pennsaid  Please see me back in 4-6 weeks if your symptoms have not improved.

## 2018-04-21 MED FILL — MELOXICAM 15 MG TABLET: 15 | 30 days supply | Qty: 30 | Fill #1

## 2018-04-27 ENCOUNTER — Ambulatory Visit (INDEPENDENT_AMBULATORY_CARE_PROVIDER_SITE_OTHER): Payer: No Typology Code available for payment source

## 2018-04-27 ENCOUNTER — Ambulatory Visit (INDEPENDENT_AMBULATORY_CARE_PROVIDER_SITE_OTHER): Payer: No Typology Code available for payment source | Admitting: Family Medicine

## 2018-04-27 ENCOUNTER — Encounter: Payer: Self-pay | Admitting: Family Medicine

## 2018-04-27 VITALS — BP 130/82 | Ht 67.0 in

## 2018-04-27 DIAGNOSIS — M170 Bilateral primary osteoarthritis of knee: Secondary | ICD-10-CM

## 2018-04-27 NOTE — Patient Instructions (Signed)
Good to see you  Please try compression on your knee  Please use ice if needed  Please see [place] back in 4-5 weeks if no better.

## 2018-04-27 NOTE — Progress Notes (Signed)
Angela Stark - 48 y.o. female MRN [id number]  Date of birth: [birth year]  SUBJECTIVE:  Including CC & ROS.  Chief Complaint  Patient presents with  . Follow-up    bilateral knee pain    Angela Stark is a 48 y.o. female that is presenting with acute on chronic right knee pain and swelling.  She received the injection early in January and that helped for a few days.  Reports the swelling has returned.  Her symptoms seem to be worsening.  She is tried compression and over-the-counter medications with limited improvement.  Denies any inciting event.  Worse at the end of the day and with prolonged walking.  No mechanical symptoms.  Localized to the knee.  Symptoms are moderate to severe.  Pain is sharp and stabbing..    Review of Systems  Constitutional: Negative for fever.  HENT: Negative for congestion.   Respiratory: Negative for cough.   Cardiovascular: Negative for chest pain.  Gastrointestinal: Negative for abdominal pain.  Musculoskeletal: Positive for joint swelling.  Skin: Negative for color change.  Neurological: Negative for weakness.  Hematological: Negative for adenopathy.  Psychiatric/Behavioral: Negative for agitation.    HISTORY: Past Medical, Surgical, Social, and Family History Reviewed & Updated per EMR.   Pertinent Historical Findings include:  Past Medical History:  Diagnosis Date  . Anemia   . Anxiety   . Back pain   . Depression   . Fatty liver   . GERD (gastroesophageal reflux disease)   . Heart murmur    When pregnant  . Hyperlipidemia   . Hypertension   . Joint pain   . Knee pain   . Muscle stiffness   . Phlebitis   . Shortness of breath   . Shortness of breath on exertion   . Sleep apnea   . Sleep apnea   . Stress   . Swelling of both lower extremities   . Trouble in sleeping     Past Surgical History:  Procedure Laterality Date  . OVARY SURGERY      No Known Allergies  Family History  Problem Relation Age of Onset  .  Hypertension Mother   . Hyperlipidemia Mother   . Diabetes Mother   . Emphysema Mother   . Obesity Mother   . Heart disease Father   . Hypertension Father   . Hyperlipidemia Father   . Obesity Father   . Sleep apnea Father   . Alcoholism Father   . Stroke Brother   . Lung cancer Maternal Grandmother      Social History   Socioeconomic History  . Marital status: Married    Spouse name: Onalee Hua  . Number of children: 3  . Years of education: 44  . Highest education level: Not on file  Occupational History  . Occupation: Scientist, research (medical): [employer]  Social Needs  . Financial resource strain: Not on file  . Food insecurity:    Worry: Not on file    Inability: Not on file  . Transportation needs:    Medical: Not on file    Non-medical: Not on file  Tobacco Use  . Smoking status: Current Every Day Smoker    Packs/day: 0.50    Years: 20.00    Pack years: 10.00    Types: Cigarettes  . Smokeless tobacco: Never Used  Substance and Sexual Activity  . Alcohol use: No  . Drug use: No  . Sexual activity: Yes    Birth control/protection:  None  Lifestyle  . Physical activity:    Days per week: Not on file    Minutes per session: Not on file  . Stress: Not on file  Relationships  . Social connections:    Talks on phone: Not on file    Gets together: Not on file    Attends religious service: Not on file    Active member of club or organization: Not on file    Attends meetings of clubs or organizations: Not on file    Relationship status: Not on file  . Intimate partner violence:    Fear of current or ex partner: Not on file    Emotionally abused: Not on file    Physically abused: Not on file    Forced sexual activity: Not on file  Other Topics Concern  . Not on file  Social History Narrative   Fun: Fishing, camping   Denies religious beliefs effecting health care.      PHYSICAL EXAM:  VS: BP 130/82   Ht 5\' 7"  (1.702 m)   BMI 38.53 kg/m  Physical Exam Gen:  NAD, alert, cooperative with exam, well-appearing ENT: normal lips, normal nasal mucosa,  Eye: normal EOM, normal conjunctiva and lids CV:  no edema, +2 pedal pulses   Resp: no accessory muscle use, non-labored,  Skin: no rashes, no areas of induration  Neuro: normal tone, normal sensation to touch Psych:  normal insight, alert and oriented MSK:  Right knee: Obvious effusion. Some limited extension to about few degrees short of complete. Limited flexion to about 110 degrees. Normal strength resistance. No instability. Negative McMurray's test. Neurovascular intact   Aspiration/Injection Procedure Note Angela Stark [birth year]  Procedure: Aspiration Indications: Right knee pain and swelling  Procedure Details Consent: Risks of procedure as well as the alternatives and risks of each were explained to the (patient/caregiver).  Consent for procedure obtained. Time Out: Verified patient identification, verified procedure, site/side was marked, verified correct patient position, special equipment/implants available, medications/allergies/relevent history reviewed, required imaging and test results available.  Performed.  The area was cleaned with iodine and alcohol swabs.    The right knee superior lateral suprapatellar pouch was injected using 3 cc's of 0.25% bupivacaine with a 22 1 1/2" needle.  An 18-gauge inch and half needle was used for aspiration.  Ultrasound was used. Images were obtained in long views showing the injection.    Amount of Fluid Aspirated: 40mL Character of Fluid: clear and straw colored Fluid was sent for:n/a  A sterile dressing was applied.  Patient did tolerate procedure well.       ASSESSMENT & PLAN:   Primary osteoarthritis of both knees Acute on chronic exacerbation of her underlying knee pain and swelling.  It is too soon for steroid injection. -Aspiration today. -Counseled on supportive care and home exercise therapy. -Could consider  another series of gel injections

## 2018-04-28 NOTE — Assessment & Plan Note (Signed)
Acute on chronic exacerbation of her underlying knee pain and swelling.  It is too soon for steroid injection. -Aspiration today. -Counseled on supportive care and home exercise therapy. -Could consider another series of gel injections

## 2018-05-24 ENCOUNTER — Other Ambulatory Visit: Payer: Self-pay | Admitting: Family Medicine

## 2018-05-24 DIAGNOSIS — Z6841 Body Mass Index (BMI) 40.0 and over, adult: Secondary | ICD-10-CM

## 2018-05-24 DIAGNOSIS — F17200 Nicotine dependence, unspecified, uncomplicated: Secondary | ICD-10-CM

## 2018-05-24 DIAGNOSIS — I10 Essential (primary) hypertension: Secondary | ICD-10-CM

## 2018-05-24 DIAGNOSIS — F419 Anxiety disorder, unspecified: Principal | ICD-10-CM

## 2018-05-24 DIAGNOSIS — F329 Major depressive disorder, single episode, unspecified: Secondary | ICD-10-CM

## 2018-05-24 MED FILL — AMLODIPINE BESYLATE 10 MG T: 10 | 90 days supply | Qty: 90 | Fill #0

## 2018-05-24 MED FILL — CHLORTHALIDONE 25 MG TABS: 25 | 90 days supply | Qty: 90 | Fill #0

## 2018-05-24 MED FILL — buPROPion HCL ER (XL) 150 M: 150 | 90 days supply | Qty: 90 | Fill #0

## 2018-05-24 NOTE — Telephone Encounter (Signed)
Needs office visit.

## 2018-05-26 NOTE — Progress Notes (Signed)
erroneus

## 2018-06-13 ENCOUNTER — Telehealth: Payer: Self-pay | Admitting: Family Medicine

## 2018-06-13 MED ORDER — IBUPROFEN-FAMOTIDINE 800-26.6 MG PO TABS: 1.0000 | ORAL_TABLET | Freq: Three times a day (TID) | ORAL | 3 refills | Status: DC

## 2018-06-13 NOTE — Telephone Encounter (Signed)
Copied from CRM [id number]. Topic: Quick Communication - Rx Refill/Question >> Jun 13, 2018 12:48 PM Zada Girt, Lumin L wrote: Medication: duexis (for joint pain in knees)  Has the patient contacted their pharmacy? Yes.   (Agent: If no, request that the patient contact the pharmacy for the refill.) (Agent: If yes, when and what did the pharmacy advise?)  Preferred Pharmacy (with phone number or street name): [facility] - [place], [place] - [address] [facility]. [address] [place] [address] Phone: [phone] Fax: [phone]  Agent: Please be advised that RX refills may take up to 3 business days. We ask that you follow-up with your pharmacy.

## 2018-06-13 NOTE — Telephone Encounter (Signed)
Dr. Schmitz please advise? 

## 2018-06-13 NOTE — Telephone Encounter (Signed)
Unable to leave VM for patient. If she calls back please have her speak with a nurse/CMA and inform I filled the duexis but we are advising against using anti-inflammatories due to COVID-19. The [facility] can report results to patient.   If any questions then please take the best time and phone number to call and I will try to call her back.   Myra Rude, MD [facility] 06/13/2018, 4:02 PM

## 2018-06-13 NOTE — Telephone Encounter (Signed)
error 

## 2018-06-28 ENCOUNTER — Telehealth: Payer: Self-pay | Admitting: Family Medicine

## 2018-06-28 NOTE — Telephone Encounter (Signed)
Called number provided, no answer, unable to leave VM. Will try again.

## 2018-06-28 NOTE — Telephone Encounter (Signed)
Prior authorizaiton department at [facility] pharmacy called with questions regarding duexis.  [phone]

## 2018-06-29 NOTE — Telephone Encounter (Signed)
Contacted prior authorization department reguarding duexis and answered their questions.

## 2018-07-20 ENCOUNTER — Ambulatory Visit (INDEPENDENT_AMBULATORY_CARE_PROVIDER_SITE_OTHER): Payer: No Typology Code available for payment source | Admitting: Family Medicine

## 2018-07-20 ENCOUNTER — Ambulatory Visit (INDEPENDENT_AMBULATORY_CARE_PROVIDER_SITE_OTHER): Payer: No Typology Code available for payment source

## 2018-07-20 ENCOUNTER — Encounter: Payer: Self-pay | Admitting: Family Medicine

## 2018-07-20 ENCOUNTER — Other Ambulatory Visit: Payer: Self-pay

## 2018-07-20 VITALS — BP 142/90 | HR 74 | Temp 98.3°F | Ht 67.0 in

## 2018-07-20 DIAGNOSIS — M170 Bilateral primary osteoarthritis of knee: Secondary | ICD-10-CM

## 2018-07-20 NOTE — Assessment & Plan Note (Signed)
Acute worsening of degenerative changes. Mild effusion  - b/l injection  - provided pennsaid samples - counseled on HEP  - could try gel injections.

## 2018-07-20 NOTE — Patient Instructions (Signed)
Good to see you  Please try the exercises  Take tylenol 650 mg three times a day is the best evidence based medicine we have for arthritis.  Glucosamine sulfate 750mg  twice a day is a supplement that has been shown to help moderate to severe arthritis. Vitamin D 2000 IU daily Fish oil 2 grams daily.  Tumeric 500mg  twice daily.  Capsaicin topically up to four times a day may also help with pain. Please send me a message on MyChart with any questions or updates.

## 2018-07-20 NOTE — Progress Notes (Signed)
Angela Stark - 48 y.o. female MRN [id number]  Date of birth: 04-04-1970  SUBJECTIVE:  Including CC & ROS.  Chief Complaint  Patient presents with  . Pain    right and left knee pain/ wants to check for fluid and injections    Angela Stark is a 48 y.o. female that is  Presenting with acute on chronic worsening bilateral knee pain. The pain is occurring over the joint line. The pain started a few weeks ago. Denies an inciting event. The pain is throbbing and worse at night or if she has been walking all day. She has swelling of each knee. The pain is intermittent and localized to the knee. Has tried physical therapy. No mechanical symptoms. No redness or warmth.    Review of Systems  Constitutional: Negative for fever.  HENT: Negative for congestion.   Respiratory: Negative for cough.   Cardiovascular: Negative for chest pain.  Gastrointestinal: Negative for abdominal pain.  Musculoskeletal: Positive for joint swelling.  Skin: Negative for color change.  Neurological: Negative for weakness.  Hematological: Negative for adenopathy.    HISTORY: Past Medical, Surgical, Social, and Family History Reviewed & Updated per EMR.   Pertinent Historical Findings include:  Past Medical History:  Diagnosis Date  . Anemia   . Anxiety   . Back pain   . Depression   . Fatty liver   . GERD (gastroesophageal reflux disease)   . Heart murmur    When pregnant  . Hyperlipidemia   . Hypertension   . Joint pain   . Knee pain   . Muscle stiffness   . Phlebitis   . Shortness of breath   . Shortness of breath on exertion   . Sleep apnea   . Sleep apnea   . Stress   . Swelling of both lower extremities   . Trouble in sleeping     Past Surgical History:  Procedure Laterality Date  . OVARY SURGERY      No Known Allergies  Family History  Problem Relation Age of Onset  . Hypertension Mother   . Hyperlipidemia Mother   . Diabetes Mother   . Emphysema Mother   .  Obesity Mother   . Heart disease Father   . Hypertension Father   . Hyperlipidemia Father   . Obesity Father   . Sleep apnea Father   . Alcoholism Father   . Stroke Brother   . Lung cancer Maternal Grandmother      Social History   Socioeconomic History  . Marital status: Married    Spouse name: Onalee Hua  . Number of children: 3  . Years of education: 65  . Highest education level: Not on file  Occupational History  . Occupation: Scientist, research (medical): [employer]  Social Needs  . Financial resource strain: Not on file  . Food insecurity:    Worry: Not on file    Inability: Not on file  . Transportation needs:    Medical: Not on file    Non-medical: Not on file  Tobacco Use  . Smoking status: Current Every Day Smoker    Packs/day: 0.50    Years: 20.00    Pack years: 10.00    Types: Cigarettes  . Smokeless tobacco: Never Used  Substance and Sexual Activity  . Alcohol use: No  . Drug use: No  . Sexual activity: Yes    Birth control/protection: None  Lifestyle  . Physical activity:    Days per  week: Not on file    Minutes per session: Not on file  . Stress: Not on file  Relationships  . Social connections:    Talks on phone: Not on file    Gets together: Not on file    Attends religious service: Not on file    Active member of club or organization: Not on file    Attends meetings of clubs or organizations: Not on file    Relationship status: Not on file  . Intimate partner violence:    Fear of current or ex partner: Not on file    Emotionally abused: Not on file    Physically abused: Not on file    Forced sexual activity: Not on file  Other Topics Concern  . Not on file  Social History Narrative   Fun: Fishing, camping   Denies religious beliefs effecting health care.      PHYSICAL EXAM:  VS: BP (!) 142/90   Pulse 74   Temp 98.3 F (36.8 C) (Oral)   Ht  (1.702 m)   SpO2 97%   BMI 38.53 kg/m  Physical Exam Gen: NAD, alert, cooperative with exam,  well-appearing ENT: normal lips, normal nasal mucosa,  Eye: normal EOM, normal conjunctiva and lids CV:  no edema, +2 pedal pulses   Resp: no accessory muscle use, non-labored,  GI: no masses or tenderness, no hernia  Skin: no rashes, no areas of induration  Neuro: normal tone, normal sensation to touch Psych:  normal insight, alert and oriented MSK:  Left and right knee:  Mild effusion  Normal ROM  TTP of the medial and lateral joint line  Instability with valgus and varus stress testing.  No pain with patellar grind.  Neurovascularly intact.    Aspiration/Injection Procedure Note Angela Stark October 09, 1970  Procedure: Injection Indications: right knee pain   Procedure Details Consent: Risks of procedure as well as the alternatives and risks of each were explained to the (patient/caregiver).  Consent for procedure obtained. Time Out: Verified patient identification, verified procedure, site/side was marked, verified correct patient position, special equipment/implants available, medications/allergies/relevent history reviewed, required imaging and test results available.  Performed.  The area was cleaned with iodine and alcohol swabs.    The right knee superiorlateral suprapatellar pouch was injected using 1 cc's of 40 mg  kenalog and 4 cc's of 0.25% bupvacaine with a 22 1 1/2" needle.  Ultrasound was used. Images were obtained in long views showing the injection.     A sterile dressing was applied.  Patient did tolerate procedure well.   Aspiration/Injection Procedure Note Angela Stark 07/07/1970  Procedure: Injection Indications: left knee pain   Procedure Details Consent: Risks of procedure as well as the alternatives and risks of each were explained to the (patient/caregiver).  Consent for procedure obtained. Time Out: Verified patient identification, verified procedure, site/side was marked, verified correct patient position, special equipment/implants  available, medications/allergies/relevent history reviewed, required imaging and test results available.  Performed.  The area was cleaned with iodine and alcohol swabs.    The left knee superiorlateral suprapatellar was injected using 1 cc's of 40 mg kenalog and 4 cc's of 0.25% bupvacaine with a 22 1 1/2" needle.  Ultrasound was used. Images were obtained in long views showing the injection.     A sterile dressing was applied.  Patient did tolerate procedure well.        ASSESSMENT & PLAN:   Primary osteoarthritis of both knees Acute worsening of  degenerative changes. Mild effusion  - b/l injection  - provided pennsaid samples - counseled on HEP  - could try gel injections.

## 2018-09-09 ENCOUNTER — Encounter: Payer: Self-pay | Admitting: Family Medicine

## 2018-09-09 ENCOUNTER — Ambulatory Visit (INDEPENDENT_AMBULATORY_CARE_PROVIDER_SITE_OTHER): Payer: No Typology Code available for payment source | Admitting: Family Medicine

## 2018-09-09 VITALS — BP 128/80 | HR 62 | Ht 67.0 in | Wt 252.2 lb

## 2018-09-09 DIAGNOSIS — I10 Essential (primary) hypertension: Secondary | ICD-10-CM | POA: Diagnosis not present

## 2018-09-09 DIAGNOSIS — F32A Depression, unspecified: Secondary | ICD-10-CM

## 2018-09-09 DIAGNOSIS — E7849 Other hyperlipidemia: Secondary | ICD-10-CM

## 2018-09-09 DIAGNOSIS — E559 Vitamin D deficiency, unspecified: Secondary | ICD-10-CM

## 2018-09-09 DIAGNOSIS — F17200 Nicotine dependence, unspecified, uncomplicated: Secondary | ICD-10-CM

## 2018-09-09 DIAGNOSIS — F1021 Alcohol dependence, in remission: Secondary | ICD-10-CM

## 2018-09-09 DIAGNOSIS — Z6841 Body Mass Index (BMI) 40.0 and over, adult: Secondary | ICD-10-CM

## 2018-09-09 DIAGNOSIS — F419 Anxiety disorder, unspecified: Secondary | ICD-10-CM

## 2018-09-09 DIAGNOSIS — F329 Major depressive disorder, single episode, unspecified: Secondary | ICD-10-CM

## 2018-09-09 LAB — VITAMIN D 25 HYDROXY (VIT D DEFICIENCY, FRACTURES): VITD: 27.57 ng/mL — ABNORMAL LOW (ref 30.00–100.00)

## 2018-09-09 MED ORDER — BUPROPION HCL ER (XL) 150 MG PO TB24: 150.0000 mg | ORAL_TABLET | Freq: Every day | ORAL | 1 refills | Status: DC

## 2018-09-09 MED ORDER — CHLORTHALIDONE 25 MG PO TABS: 25.0000 mg | ORAL_TABLET | Freq: Every day | ORAL | 0 refills | Status: DC

## 2018-09-09 MED ORDER — AMLODIPINE BESYLATE 10 MG PO TABS: 10.0000 mg | ORAL_TABLET | Freq: Every day | ORAL | 1 refills | Status: DC

## 2018-09-09 NOTE — Progress Notes (Signed)
Established Patient Office Visit  Subjective:  Patient ID: Angela BosworthCatherine W Stark, female    DOB: [birth year]  Age: 48 y.o. MRN: [id number]  CC:  Chief Complaint  Patient presents with   Annual Exam    HPI Angela BosworthCatherine W Caples presents for follow-up of her hypertension, elevated cholesterol, depression, vitamin D deficiency, obesity and recovery.  Blood pressure is responded well to the Norvasc and chlorthalidone.  She is tolerating these medications without issue.  She continues to exercise by achieving 10,000 steps daily mostly at her job she tells me there is some home weight lifting and resistance training.  Depression has responded well to the Wellbutrin.  She is no longer taking a vitamin D supplement.  She is no longer being seen at weight loss management.  She has been able to lose 8 pounds over the last while.  She is has been sober for 6 months now.  She is currently not attending meetings.  She has not started her Chantix yet but is vaping some in lieu of smoking.  He does plan on beginning this Chantix in a future date.  Past Medical History:  Diagnosis Date   Anemia    Anxiety    Back pain    Depression    Fatty liver    GERD (gastroesophageal reflux disease)    Heart murmur    When pregnant   Hyperlipidemia    Hypertension    Joint pain    Knee pain    Muscle stiffness    Phlebitis    Shortness of breath    Shortness of breath on exertion    Sleep apnea    Sleep apnea    Stress    Swelling of both lower extremities    Trouble in sleeping     Past Surgical History:  Procedure Laterality Date   OVARY SURGERY      Family History  Problem Relation Age of Onset   Hypertension Mother    Hyperlipidemia Mother    Diabetes Mother    Emphysema Mother    Obesity Mother    Heart disease Father    Hypertension Father    Hyperlipidemia Father    Obesity Father    Sleep apnea Father    Alcoholism Father    Stroke Brother     Lung cancer Maternal Grandmother     Social History   Socioeconomic History   Marital status: Married    Spouse name: [name]   Number of children: 3   Years of education: 14   Highest education level: Not on file  Occupational History   Occupation: Scientist, research (medical)CNA    Employer: [employer]  Social [employer]   Financial resource strain: Not on file   Food insecurity    Worry: Not on file    Inability: Not on file   Transportation needs    Medical: Not on file    Non-medical: Not on file  Tobacco Use   Smoking status: Current Every Day Smoker    Packs/day: 0.50    Years: 20.00    Pack years: 10.00    Types: Cigarettes   Smokeless tobacco: Never Used  Substance and Sexual Activity   Alcohol use: No   Drug use: No   Sexual activity: Yes    Birth control/protection: None  Lifestyle   Physical activity    Days per week: Not on file    Minutes per session: Not on file   Stress: Not on file  Relationships  Social Musicianconnections    Talks on phone: Not on file    Gets together: Not on file    Attends religious service: Not on file    Active member of club or organization: Not on file    Attends meetings of clubs or organizations: Not on file    Relationship status: Not on file   Intimate partner violence    Fear of current or ex partner: Not on file    Emotionally abused: Not on file    Physically abused: Not on file    Forced sexual activity: Not on file  Other Topics Concern   Not on file  Social History Narrative   Fun: Fishing, camping   Denies religious beliefs effecting health care.     Outpatient Medications Prior to Visit  Medication Sig Dispense Refill   Diclofenac Sodium (PENNSAID) 2 % SOLN Place 1 application onto the skin 2 (two) times daily. 1 Bottle 3   Ibuprofen-Famotidine 800-26.6 MG TABS Take 1 tablet by mouth 3 (three) times daily. 90 tablet 3   varenicline (CHANTIX PAK) 0.5 MG X 11 & 1 MG X 42 tablet Take one 0.5 mg tablet by mouth once daily  for 3 days, then increase to one 0.5 mg tablet twice daily for 4 days, then increase to one 1 mg tablet twice daily. 53 tablet 0   Vitamin D, Ergocalciferol, (DRISDOL) 50000 units CAPS capsule Take 1 capsule (50,000 Units total) by mouth every 7 (seven) days. 4 capsule 0   amLODipine (NORVASC) 10 MG tablet TAKE 1 TABLET BY MOUTH DAILY. 90 tablet 0   buPROPion (WELLBUTRIN XL) 150 MG 24 hr tablet TAKE 1 TABLET BY MOUTH DAILY. 90 tablet 0   chlorthalidone (HYGROTON) 25 MG tablet Take 1 tablet (25 mg total) by mouth daily. 100 tablet 0   No facility-administered medications prior to visit.     No Known Allergies  ROS Review of Systems  Constitutional: Negative for chills, diaphoresis, fatigue, fever and unexpected weight change.  HENT: Negative.   Eyes: Negative for photophobia and visual disturbance.  Respiratory: Negative.   Cardiovascular: Negative.   Gastrointestinal: Negative.   Endocrine: Negative for polyphagia and polyuria.  Genitourinary: Negative.   Musculoskeletal: Positive for arthralgias. Negative for gait problem.  Skin: Negative for pallor and rash.  Allergic/Immunologic: Negative for immunocompromised state.  Neurological: Negative for light-headedness and headaches.  Hematological: Does not bruise/bleed easily.  Psychiatric/Behavioral: Negative.  Negative for dysphoric mood. The patient is not nervous/anxious.       Objective:    Physical Exam  Constitutional: She is oriented to person, place, and time. She appears well-developed and well-nourished. No distress.  HENT:  Head: Normocephalic and atraumatic.  Right Ear: External ear normal.  Left Ear: External ear normal.  Mouth/Throat: Oropharynx is clear and moist. No oropharyngeal exudate.  Eyes: Pupils are equal, round, and reactive to light. Conjunctivae are normal. Right eye exhibits no discharge. Left eye exhibits no discharge. No scleral icterus.  Neck: No JVD present. No tracheal deviation present.    Cardiovascular: Normal rate, regular rhythm and normal heart sounds.  Pulmonary/Chest: Effort normal and breath sounds normal. No stridor.  Abdominal: Bowel sounds are normal.  Musculoskeletal:        General: No edema.  Neurological: She is alert and oriented to person, place, and time.  Skin: Skin is warm and dry. She is not diaphoretic.  Psychiatric: She has a normal mood and affect. Her behavior is normal.    BP  128/80    Pulse 62    Ht 5\' 7"  (1.702 m)    Wt 252 lb 4 oz (114.4 kg)    SpO2 97%    BMI 39.51 kg/m  Wt Readings from Last 3 Encounters:  09/09/18 252 lb 4 oz (114.4 kg)  03/31/18 246 lb (111.6 kg)  01/20/18 246 lb (111.6 kg)   BP Readings from Last 3 Encounters:  09/09/18 128/80  07/20/18 (!) 142/90  04/27/18 130/82   Guideline developer:  UpToDate (see UpToDate for funding source) Date Released: June 2014  There are no preventive care reminders to display for this patient.  There are no preventive care reminders to display for this patient.  Lab Results  Component Value Date   TSH 2.230 10/18/2017   Lab Results  Component Value Date   WBC 7.1 10/18/2017   HGB 15.0 10/18/2017   HCT 46.3 10/18/2017   MCV 99 (H) 10/18/2017   PLT 218.0 05/28/2017   Lab Results  Component Value Date   NA 138 10/18/2017   K 3.9 10/18/2017   CO2 29 10/18/2017   GLUCOSE 70 10/18/2017   BUN 12 10/18/2017   CREATININE 0.60 10/18/2017   BILITOT 0.4 10/18/2017   ALKPHOS 55 10/18/2017   AST 35 10/18/2017   ALT 34 (H) 10/18/2017   PROT 7.3 10/18/2017   ALBUMIN 4.6 10/18/2017   CALCIUM 9.1 10/18/2017   GFR 113.92 09/06/2017   Lab Results  Component Value Date   CHOL 233 (H) 10/18/2017   Lab Results  Component Value Date   HDL 53 10/18/2017   Lab Results  Component Value Date   LDLCALC 129 (H) 10/18/2017   Lab Results  Component Value Date   TRIG 253 (H) 10/18/2017   Lab Results  Component Value Date   CHOLHDL 4 05/28/2017   Lab Results  Component Value  Date   HGBA1C 5.3 10/18/2017      Assessment & Plan:   Problem List Items Addressed This Visit      Cardiovascular and Mediastinum   Essential hypertension - Primary   Relevant Medications   amLODipine (NORVASC) 10 MG tablet   chlorthalidone (HYGROTON) 25 MG tablet   Other Relevant Orders   CBC   Comprehensive metabolic panel   Urinalysis, Routine w reflex microscopic   Microalbumin / creatinine urine ratio     Other   Anxiety and depression   Relevant Medications   buPROPion (WELLBUTRIN XL) 150 MG 24 hr tablet   Tobacco use disorder   Relevant Medications   buPROPion (WELLBUTRIN XL) 150 MG 24 hr tablet   Class 3 severe obesity due to excess calories with body mass index (BMI) of 40.0 to 44.9 in adult [facility](HCC)   Relevant Medications   buPROPion (WELLBUTRIN XL) 150 MG 24 hr tablet   Recovering alcoholic (HCC)    Other Visit Diagnoses    Other hyperlipidemia       Relevant Medications   amLODipine (NORVASC) 10 MG tablet   chlorthalidone (HYGROTON) 25 MG tablet   Other Relevant Orders   Comprehensive metabolic panel   LDL cholesterol, direct   Lipid panel   Vitamin D deficiency       Relevant Orders   VITAMIN D 25 Hydroxy (Vit-D Deficiency, Fractures)      Meds ordered this encounter  Medications   amLODipine (NORVASC) 10 MG tablet    Sig: Take 1 tablet (10 mg total) by mouth daily.    Dispense:  90 tablet  Refill:  1   buPROPion (WELLBUTRIN XL) 150 MG 24 hr tablet    Sig: Take 1 tablet (150 mg total) by mouth daily.    Dispense:  90 tablet    Refill:  1   chlorthalidone (HYGROTON) 25 MG tablet    Sig: Take 1 tablet (25 mg total) by mouth daily.    Dispense:  100 tablet    Refill:  0    Follow-up: Return in about 6 months (around 03/11/2019), or if symptoms worsen or fail to improve.   Patient was given information on the Mediterranean diet.  Refilled above medications and she will follow-up in 6 months.

## 2018-09-09 NOTE — Patient Instructions (Signed)
Mediterranean Diet  A Mediterranean diet refers to food and lifestyle choices that are based on the traditions of countries located on the [place]. This way of eating has been shown to help prevent certain conditions and improve outcomes for people who have chronic diseases, like kidney disease and heart disease.  What are tips for following this plan?  Lifestyle   Cook and eat meals together with your family, when possible.   Drink enough fluid to keep your urine clear or pale yellow.   Be physically active every day. This includes:  ? Aerobic exercise like running or swimming.  ? Leisure activities like gardening, walking, or housework.   Get 7-8 hours of sleep each night.   If recommended by your health care provider, drink red wine in moderation. This means 1 glass a day for nonpregnant women and 2 glasses a day for men. A glass of wine equals 5 oz (150 mL).  Reading food labels     Check the serving size of packaged foods. For foods such as rice and pasta, the serving size refers to the amount of cooked product, not dry.   Check the total fat in packaged foods. Avoid foods that have saturated fat or trans fats.   Check the ingredients list for added sugars, such as corn syrup.  Shopping   At the grocery store, buy most of your food from the areas near the walls of the store. This includes:  ? Fresh fruits and vegetables (produce).  ? Grains, beans, nuts, and seeds. Some of these may be available in unpackaged forms or large amounts (in bulk).  ? Fresh seafood.  ? Poultry and eggs.  ? Low-fat dairy products.   Buy whole ingredients instead of prepackaged foods.   Buy fresh fruits and vegetables in-season from local farmers markets.   Buy frozen fruits and vegetables in resealable bags.   If you do not have access to quality fresh seafood, buy precooked frozen shrimp or canned fish, such as tuna, salmon, or sardines.   Buy small amounts of raw or cooked vegetables, salads, or olives from  the deli or salad bar at your store.   Stock your pantry so you always have certain foods on hand, such as olive oil, canned tuna, canned tomatoes, rice, pasta, and beans.  Cooking   Cook foods with extra-virgin olive oil instead of using butter or other vegetable oils.   Have meat as a side dish, and have vegetables or grains as your main dish. This means having meat in small portions or adding small amounts of meat to foods like pasta or stew.   Use beans or vegetables instead of meat in common dishes like chili or lasagna.   Experiment with different cooking methods. Try roasting or broiling vegetables instead of steaming or sauteing them.   Add frozen vegetables to soups, stews, pasta, or rice.   Add nuts or seeds for added healthy fat at each meal. You can add these to yogurt, salads, or vegetable dishes.   Marinate fish or vegetables using olive oil, lemon juice, garlic, and fresh herbs.  Meal planning     Plan to eat 1 vegetarian meal one day each week. Try to work up to 2 vegetarian meals, if possible.   Eat seafood 2 or more times a week.   Have healthy snacks readily available, such as:  ? Vegetable sticks with hummus.  ? Greek yogurt.  ? Fruit and nut trail mix.   Eat balanced   meals throughout the week. This includes:  ? Fruit: 2-3 servings a day  ? Vegetables: 4-5 servings a day  ? Low-fat dairy: 2 servings a day  ? Fish, poultry, or lean meat: 1 serving a day  ? Beans and legumes: 2 or more servings a week  ? Nuts and seeds: 1-2 servings a day  ? Whole grains: 6-8 servings a day  ? Extra-virgin olive oil: 3-4 servings a day   Limit red meat and sweets to only a few servings a month  What are my food choices?   Mediterranean diet  ? Recommended  ? Grains: Whole-grain pasta. Brown rice. Bulgar wheat. Polenta. Couscous. Whole-wheat bread. Oatmeal. Quinoa.  ? Vegetables: Artichokes. Beets. Broccoli. Cabbage. Carrots. Eggplant. Green beans. Chard. Kale. Spinach. Onions. Leeks. Peas. Squash.  Tomatoes. Peppers. Radishes.  ? Fruits: Apples. Apricots. Avocado. Berries. Bananas. Cherries. Dates. Figs. Grapes. Lemons. Melon. Oranges. Peaches. Plums. Pomegranate.  ? Meats and other protein foods: Beans. Almonds. Sunflower seeds. Pine nuts. Peanuts. Cod. Salmon. Scallops. Shrimp. Tuna. Tilapia. Clams. Oysters. Eggs.  ? Dairy: Low-fat milk. Cheese. Greek yogurt.  ? Beverages: Water. Red wine. Herbal tea.  ? Fats and oils: Extra virgin olive oil. Avocado oil. Grape seed oil.  ? Sweets and desserts: Greek yogurt with honey. Baked apples. Poached pears. Trail mix.  ? Seasoning and other foods: Basil. Cilantro. Coriander. Cumin. Mint. Parsley. Sage. Rosemary. Tarragon. Garlic. Oregano. Thyme. Pepper. Balsalmic vinegar. Tahini. Hummus. Tomato sauce. Olives. Mushrooms.  ? Limit these  ? Grains: Prepackaged pasta or rice dishes. Prepackaged cereal with added sugar.  ? Vegetables: Deep fried potatoes (french fries).  ? Fruits: Fruit canned in syrup.  ? Meats and other protein foods: Beef. Pork. Lamb. Poultry with skin. Hot dogs. Bacon.  ? Dairy: Ice cream. Sour cream. Whole milk.  ? Beverages: Juice. Sugar-sweetened soft drinks. Beer. Liquor and spirits.  ? Fats and oils: Butter. Canola oil. Vegetable oil. Beef fat (tallow). Lard.  ? Sweets and desserts: Cookies. Cakes. Pies. Candy.  ? Seasoning and other foods: Mayonnaise. Premade sauces and marinades.  ? The items listed may not be a complete list. Talk with your dietitian about what dietary choices are right for you.  Summary   The Mediterranean diet includes both food and lifestyle choices.   Eat a variety of fresh fruits and vegetables, beans, nuts, seeds, and whole grains.   Limit the amount of red meat and sweets that you eat.   Talk with your health care provider about whether it is safe for you to drink red wine in moderation. This means 1 glass a day for nonpregnant women and 2 glasses a day for men. A glass of wine equals 5 oz (150 mL).  This information  is not intended to replace advice given to you by your health care provider. Make sure you discuss any questions you have with your health care provider.  Document Released: 10/31/2015 Document Revised: 12/03/2015 Document Reviewed: 10/31/2015  Elsevier Interactive Patient Education  2019 [employer]

## 2018-09-10 LAB — LIPID PANEL
Cholesterol: 238 mg/dL — ABNORMAL HIGH (ref <200)
HDL: 43 mg/dL — ABNORMAL LOW (ref >=50)
LDL Cholesterol (Calc): 163 mg/dL (calc) — ABNORMAL HIGH
Non-HDL Cholesterol (Calc): 195 mg/dL (calc) — ABNORMAL HIGH (ref <130)
Total CHOL/HDL Ratio: 5.5 (calc) — ABNORMAL HIGH (ref <5.0)
Triglycerides: 169 mg/dL — ABNORMAL HIGH (ref <150)

## 2018-09-10 LAB — CBC
HCT: 46.5 % — ABNORMAL HIGH (ref 35.0–45.0)
Hemoglobin: 15.8 g/dL — ABNORMAL HIGH (ref 11.7–15.5)
MCH: 32 pg (ref 27.0–33.0)
MCHC: 34 g/dL (ref 32.0–36.0)
MCV: 94.1 fL (ref 80.0–100.0)
MPV: 11.3 fL (ref 7.5–12.5)
Platelets: 294 10*3/uL (ref 140–400)
RBC: 4.94 10*6/uL (ref 3.80–5.10)
RDW: 12.9 % (ref 11.0–15.0)
WBC: 6.7 10*3/uL (ref 3.8–10.8)

## 2018-09-10 LAB — MICROALBUMIN / CREATININE URINE RATIO
Creatinine, Urine: 19 mg/dL — ABNORMAL LOW (ref 20–275)
Microalb Creat Ratio: 26 mcg/mg creat (ref <30)
Microalb, Ur: 0.5 mg/dL

## 2018-09-10 LAB — COMPREHENSIVE METABOLIC PANEL
AG Ratio: 1.7 (calc) (ref 1.0–2.5)
ALT: 37 U/L — ABNORMAL HIGH (ref 6–29)
AST: 33 U/L (ref 10–35)
Albumin: 4.2 g/dL (ref 3.6–5.1)
Alkaline phosphatase (APISO): 52 U/L (ref 31–125)
BUN: 9 mg/dL (ref 7–25)
CO2: 28 mmol/L (ref 20–32)
Calcium: 9.2 mg/dL (ref 8.6–10.2)
Chloride: 94 mmol/L — ABNORMAL LOW (ref 98–110)
Creat: 0.6 mg/dL (ref 0.50–1.10)
Globulin: 2.5 g/dL (calc) (ref 1.9–3.7)
Glucose, Bld: 99 mg/dL (ref 65–99)
Potassium: 3.4 mmol/L — ABNORMAL LOW (ref 3.5–5.3)
Sodium: 136 mmol/L (ref 135–146)
Total Bilirubin: 0.7 mg/dL (ref 0.2–1.2)
Total Protein: 6.7 g/dL (ref 6.1–8.1)

## 2018-09-10 LAB — URINALYSIS, ROUTINE W REFLEX MICROSCOPIC
Bilirubin Urine: NEGATIVE
Glucose, UA: NEGATIVE
Hgb urine dipstick: NEGATIVE
Ketones, ur: NEGATIVE
Leukocytes,Ua: NEGATIVE
Nitrite: NEGATIVE
Protein, ur: NEGATIVE
Specific Gravity, Urine: 1.003 (ref 1.001–1.03)
pH: 7.5 (ref 5.0–8.0)

## 2018-09-10 LAB — LDL CHOLESTEROL, DIRECT: Direct LDL: 173 mg/dL — ABNORMAL HIGH (ref <100)

## 2018-09-12 MED ORDER — VITAMIN D (ERGOCALCIFEROL) 1.25 MG (50000 UNIT) PO CAPS: 50000.0000 [IU] | ORAL_CAPSULE | ORAL | 1 refills | Status: DC

## 2018-09-12 MED FILL — VIT D2 1.25 MG (50,000 UNIT: 1.25 MG | 84 days supply | Qty: 12 | Fill #0

## 2018-09-12 NOTE — Addendum Note (Signed)
Addended by: Jon Billings on: 09/12/2018 08:43 AM   Modules accepted: Orders

## 2018-10-24 ENCOUNTER — Other Ambulatory Visit: Payer: Self-pay | Admitting: Family Medicine

## 2018-10-24 DIAGNOSIS — F419 Anxiety disorder, unspecified: Secondary | ICD-10-CM

## 2018-10-24 DIAGNOSIS — I10 Essential (primary) hypertension: Secondary | ICD-10-CM

## 2018-10-24 DIAGNOSIS — F17200 Nicotine dependence, unspecified, uncomplicated: Secondary | ICD-10-CM

## 2018-10-24 DIAGNOSIS — Z6841 Body Mass Index (BMI) 40.0 and over, adult: Secondary | ICD-10-CM

## 2018-10-24 DIAGNOSIS — F329 Major depressive disorder, single episode, unspecified: Secondary | ICD-10-CM

## 2018-10-24 MED FILL — buPROPion HCL ER (XL) 150 M: 150 | 90 days supply | Qty: 90 | Fill #0

## 2018-10-24 MED FILL — CHLORTHALIDONE 25 MG TABS: 25 | 90 days supply | Qty: 90 | Fill #0

## 2018-10-24 MED FILL — AMLODIPINE BESYLATE 10 MG T: 10 | 90 days supply | Qty: 90 | Fill #0

## 2019-03-30 ENCOUNTER — Other Ambulatory Visit: Payer: Self-pay | Admitting: Family Medicine

## 2019-03-30 DIAGNOSIS — F17200 Nicotine dependence, unspecified, uncomplicated: Secondary | ICD-10-CM

## 2019-03-30 DIAGNOSIS — Z6841 Body Mass Index (BMI) 40.0 and over, adult: Secondary | ICD-10-CM

## 2019-03-30 DIAGNOSIS — F419 Anxiety disorder, unspecified: Secondary | ICD-10-CM

## 2019-03-30 DIAGNOSIS — F329 Major depressive disorder, single episode, unspecified: Secondary | ICD-10-CM

## 2019-03-30 MED FILL — CHLORTHALIDONE 25 MG TABS: 25 | 90 days supply | Qty: 90 | Fill #1

## 2019-03-30 MED FILL — AMLODIPINE BESYLATE 10 MG T: 10 | 90 days supply | Qty: 90 | Fill #0

## 2019-03-30 MED FILL — buPROPion HCL ER (XL) 150 M: 150 | 90 days supply | Qty: 90 | Fill #0

## 2019-03-30 NOTE — Telephone Encounter (Signed)
Sent mychart message that appointment is needed.

## 2019-04-03 NOTE — Telephone Encounter (Signed)
Attempted to reach patient. No answer. Vm left to call back  

## 2019-04-04 MED FILL — VIT D2 1.25 MG (50,000 UNIT: 1.25 MG | 84 days supply | Qty: 12 | Fill #1

## 2019-04-10 ENCOUNTER — Ambulatory Visit: Payer: No Typology Code available for payment source | Admitting: Family Medicine

## 2019-09-18 ENCOUNTER — Other Ambulatory Visit: Payer: Self-pay

## 2019-09-19 ENCOUNTER — Ambulatory Visit (INDEPENDENT_AMBULATORY_CARE_PROVIDER_SITE_OTHER): Payer: No Typology Code available for payment source | Admitting: Family Medicine

## 2019-09-19 ENCOUNTER — Encounter: Payer: Self-pay | Admitting: Family Medicine

## 2019-09-19 VITALS — BP 128/78 | HR 72 | Temp 97.6°F | Ht 66.0 in | Wt 256.2 lb

## 2019-09-19 DIAGNOSIS — F17200 Nicotine dependence, unspecified, uncomplicated: Secondary | ICD-10-CM

## 2019-09-19 DIAGNOSIS — I10 Essential (primary) hypertension: Secondary | ICD-10-CM | POA: Diagnosis not present

## 2019-09-19 DIAGNOSIS — F419 Anxiety disorder, unspecified: Secondary | ICD-10-CM

## 2019-09-19 DIAGNOSIS — M170 Bilateral primary osteoarthritis of knee: Secondary | ICD-10-CM

## 2019-09-19 DIAGNOSIS — F329 Major depressive disorder, single episode, unspecified: Secondary | ICD-10-CM

## 2019-09-19 DIAGNOSIS — F32A Depression, unspecified: Secondary | ICD-10-CM

## 2019-09-19 DIAGNOSIS — E7849 Other hyperlipidemia: Secondary | ICD-10-CM

## 2019-09-19 DIAGNOSIS — E559 Vitamin D deficiency, unspecified: Secondary | ICD-10-CM | POA: Diagnosis not present

## 2019-09-19 NOTE — Progress Notes (Addendum)
Established Patient Office Visit  Subjective:  Patient ID: Angela Stark, female    DOB: 1970-10-13  Age: 49 y.o. MRN: [id number]  CC:  Chief Complaint  Patient presents with   Annual Exam    CPE, no concerns.     HPI Angela BosworthCatherine W Stark presents for follow-up of hypertension, elevated LDL cholesterol, depression with anxiety, tobacco use and arthritis of both knees.  Blood pressure controlled with amlodipine and chlorthalidone.  She has a 6-hour fasting for today's blood work, continues with Wellbutrin for anxiety and depression.  Decreased smoking down to 4 to 5 cigarettes every other day.  Has not started Chantix yet.  Continue sobriety journey.  Had a relapse in May but quickly rejoin the program and has daily contact with her sponsor.  Past Medical History:  Diagnosis Date   Anemia    Anxiety    Back pain    Depression    Fatty liver    GERD (gastroesophageal reflux disease)    Heart murmur    When pregnant   Hyperlipidemia    Hypertension    Joint pain    Knee pain    Muscle stiffness    Phlebitis    Shortness of breath    Shortness of breath on exertion    Sleep apnea    Sleep apnea    Stress    Swelling of both lower extremities    Trouble in sleeping     Past Surgical History:  Procedure Laterality Date   OVARY SURGERY      Family History  Problem Relation Age of Onset   Hypertension Mother    Hyperlipidemia Mother    Diabetes Mother    Emphysema Mother    Obesity Mother    Heart disease Father    Hypertension Father    Hyperlipidemia Father    Obesity Father    Sleep apnea Father    Alcoholism Father    Stroke Brother    Lung cancer Maternal Grandmother     Social History   Socioeconomic History   Marital status: Married    Spouse name: [name]   Number of children: 3   Years of education: 14   Highest education level: Not on file  Occupational History   Occupation: Scientist, research (medical)CNA    Employer: [employer]  Tobacco Use   Smoking status: Current Every Day Smoker    Packs/day: 0.50    Years: 20.00    Pack years: 10.00    Types: Cigarettes   Smokeless tobacco: Never Used  Building services engineerVaping Use   Vaping Use: Never used  Substance and Sexual Activity   Alcohol use: No   Drug use: No   Sexual activity: Yes    Birth control/protection: None  Other Topics Concern   Not on file  Social History Narrative   Fun: Fishing, camping   Denies religious beliefs effecting health care.    Social Determinants of Health   Financial Resource Strain:    Difficulty of Paying Living Expenses:   Food Insecurity:    Worried About Programme researcher, broadcasting/film/videounning Out of Food in the Last Year:    Baristaan Out of Food in the Last Year:   Transportation Needs:    Freight forwarderLack of Transportation (Medical):    Lack of Transportation (Non-Medical):   Physical Activity:    Days of Exercise per Week:    Minutes of Exercise per Session:   Stress:    Feeling of Stress :   Social Connections:  Frequency of Communication with Friends and Family:    Frequency of Social Gatherings with Friends and Family:    Attends Religious Services:    Active Member of Clubs or Organizations:    Attends Engineer, structural:    Marital Status:   Intimate Partner Violence:    Fear of Current or Ex-Partner:    Emotionally Abused:    Physically Abused:    Sexually Abused:     Outpatient Medications Prior to Visit  Medication Sig Dispense Refill   amLODipine (NORVASC) 10 MG tablet TAKE 1 TABLET BY MOUTH DAILY. 90 tablet 0   buPROPion (WELLBUTRIN XL) 150 MG 24 hr tablet TAKE 1 TABLET BY MOUTH DAILY. 90 tablet 0   chlorthalidone (HYGROTON) 25 MG tablet TAKE 1 TABLET BY MOUTH DAILY. 100 tablet 1   Ibuprofen-Famotidine 800-26.6 MG TABS Take 1 tablet by mouth 3 (three) times daily. 90 tablet 3   Vitamin D, Ergocalciferol, (DRISDOL) 1.25 MG (50000 UT) CAPS capsule Take 1 capsule (50,000 Units total) by mouth every 7 (seven) days.  12 capsule 1   Diclofenac Sodium (PENNSAID) 2 % SOLN Place 1 application onto the skin 2 (two) times daily. (Patient not taking: Reported on 09/19/2019) 1 Bottle 3   varenicline (CHANTIX PAK) 0.5 MG X 11 & 1 MG X 42 tablet Take one 0.5 mg tablet by mouth once daily for 3 days, then increase to one 0.5 mg tablet twice daily for 4 days, then increase to one 1 mg tablet twice daily. (Patient not taking: Reported on 09/19/2019) 53 tablet 0   No facility-administered medications prior to visit.    No Known Allergies  ROS Review of Systems  Constitutional: Negative.   HENT: Negative.   Eyes: Negative for photophobia and visual disturbance.  Respiratory: Negative.   Cardiovascular: Negative.   Gastrointestinal: Negative.   Endocrine: Negative for polyphagia and polyuria.  Genitourinary: Negative.   Musculoskeletal: Positive for arthralgias and joint swelling.  Skin: Negative for pallor and rash.  Allergic/Immunologic: Negative for immunocompromised state.  Neurological: Negative for tremors, speech difficulty and light-headedness.  Hematological: Does not bruise/bleed easily.   Depression screen [facility] 2/9 09/19/2019 09/19/2019 09/09/2018  Decreased Interest 0 0 0  Down, Depressed, Hopeless 0 0 0  PHQ - 2 Score 0 0 0  Altered sleeping 1 - 0  Tired, decreased energy 0 - 0  Change in appetite 0 - 0  Feeling bad or failure about yourself  0 - 0  Trouble concentrating 0 - 0  Moving slowly or fidgety/restless 0 - 0  Suicidal thoughts 0 - 0  PHQ-9 Score 1 - 0  Difficult doing work/chores Not difficult at all - -      Objective:    Physical Exam Vitals and nursing note reviewed.  Constitutional:      General: She is not in acute distress.    Appearance: Normal appearance. She is not ill-appearing, toxic-appearing or diaphoretic.  HENT:     Head: Normocephalic and atraumatic.     Right Ear: Tympanic membrane, ear canal and external ear normal.     Left Ear: Tympanic membrane, ear canal  and external ear normal.     Mouth/Throat:     Mouth: Mucous membranes are moist.     Pharynx: Oropharynx is clear. No oropharyngeal exudate or posterior oropharyngeal erythema.  Eyes:     General:        Right eye: No discharge.        Left eye: No discharge.  Conjunctiva/sclera: Conjunctivae normal.  Cardiovascular:     Rate and Rhythm: Normal rate and regular rhythm.  Pulmonary:     Effort: Pulmonary effort is normal.     Breath sounds: Normal breath sounds.  Abdominal:     General: Bowel sounds are normal.  Musculoskeletal:     Cervical back: No rigidity or tenderness.     Right lower leg: Edema present.     Left lower leg: No edema (trace to 1 plus).  Lymphadenopathy:     Cervical: No cervical adenopathy.  Skin:    General: Skin is warm and dry.  Neurological:     Mental Status: She is alert and oriented to person, place, and time.  Psychiatric:        Mood and Affect: Mood normal.        Behavior: Behavior normal.     BP 128/78    Pulse 72    Temp 97.6 F (36.4 C) (Tympanic)    Ht 5\' 6"  (1.676 m)    Wt 256 lb 3.2 oz (116.2 kg)    SpO2 97%    BMI 41.35 kg/m  Wt Readings from Last 3 Encounters:  09/19/19 256 lb 3.2 oz (116.2 kg)  09/09/18 252 lb 4 oz (114.4 kg)  03/31/18 246 lb (111.6 kg)     Health Maintenance Due  Topic Date Due   Hepatitis C Screening  Never done    There are no preventive care reminders to display for this patient.  Lab Results  Component Value Date   TSH 2.230 10/18/2017   Lab Results  Component Value Date   WBC 6.7 09/09/2018   HGB 15.8 (H) 09/09/2018   HCT 46.5 (H) 09/09/2018   MCV 94.1 09/09/2018   PLT 294 09/09/2018   Lab Results  Component Value Date   NA 136 09/09/2018   K 3.4 (L) 09/09/2018   CO2 28 09/09/2018   GLUCOSE 99 09/09/2018   BUN 9 09/09/2018   CREATININE 0.60 09/09/2018   BILITOT 0.7 09/09/2018   ALKPHOS 55 10/18/2017   AST 33 09/09/2018   ALT 37 (H) 09/09/2018   PROT 6.7 09/09/2018   ALBUMIN 4.6  10/18/2017   CALCIUM 9.2 09/09/2018   GFR 113.92 09/06/2017   Lab Results  Component Value Date   CHOL 238 (H) 09/09/2018   Lab Results  Component Value Date   HDL 43 (L) 09/09/2018   Lab Results  Component Value Date   LDLCALC 163 (H) 09/09/2018   Lab Results  Component Value Date   TRIG 169 (H) 09/09/2018   Lab Results  Component Value Date   CHOLHDL 5.5 (H) 09/09/2018   Lab Results  Component Value Date   HGBA1C 5.3 10/18/2017      Assessment & Plan:   Problem List Items Addressed This Visit      Cardiovascular and Mediastinum   Essential hypertension - Primary   Relevant Orders   CBC   Comprehensive metabolic panel   Urinalysis, Routine w reflex microscopic   Microalbumin / creatinine urine ratio     Musculoskeletal and Integument   Primary osteoarthritis of both knees   Relevant Orders   Ambulatory referral to Sports Medicine     Other   Anxiety and depression   Tobacco use disorder    Other Visit Diagnoses    Other hyperlipidemia       Relevant Orders   Comprehensive metabolic panel   LDL cholesterol, direct   Lipid panel   Vitamin D  deficiency       Relevant Orders   VITAMIN D 25 Hydroxy (Vit-D Deficiency, Fractures)      No orders of the defined types were placed in this encounter.   Follow-up: Return in about 6 months (around 03/20/2020).  Consider support hose for lower extremity swelling.  Information given on coping to quit smoking advised her to continue to try to quit.  She had tics may not be necessary.  Discussed possible side effects of Chantix again.  We will discuss weight at next visit. Mliss Sax, MD

## 2019-09-19 NOTE — Patient Instructions (Signed)
Coping with Quitting Smoking  Quitting smoking is a physical and mental challenge. You will face cravings, withdrawal symptoms, and temptation. Before quitting, work with your health care provider to make a plan that can help you cope. Preparation can help you quit and keep you from giving in. How can I cope with cravings? Cravings usually last for 5-10 minutes. If you get through it, the craving will pass. Consider taking the following actions to help you cope with cravings:  Keep your mouth busy: ? Chew sugar-free gum. ? Suck on hard candies or a straw. ? Brush your teeth.  Keep your hands and body busy: ? Immediately change to a different activity when you feel a craving. ? Squeeze or play with a ball. ? Do an activity or a hobby, like making bead jewelry, practicing needlepoint, or working with wood. ? Mix up your normal routine. ? Take a short exercise break. Go for a quick walk or run up and down stairs. ? Spend time in public places where smoking is not allowed.  Focus on doing something kind or helpful for someone else.  Call a friend or family member to talk during a craving.  Join a support group.  Call a quit line, such as 1-800-QUIT-NOW.  Talk with your health care provider about medicines that might help you cope with cravings and make quitting easier for you. How can I deal with withdrawal symptoms? Your body may experience negative effects as it tries to get used to not having nicotine in the system. These effects are called withdrawal symptoms. They may include:  Feeling hungrier than normal.  Trouble concentrating.  Irritability.  Trouble sleeping.  Feeling depressed.  Restlessness and agitation.  Craving a cigarette. To manage withdrawal symptoms:  Avoid places, people, and activities that trigger your cravings.  Remember why you want to quit.  Get plenty of sleep.  Avoid coffee and other caffeinated drinks. These may worsen some of your  symptoms. How can I handle social situations? Social situations can be difficult when you are quitting smoking, especially in the first few weeks. To manage this, you can:  Avoid parties, bars, and other social situations where people might be smoking.  Avoid alcohol.  Leave right away if you have the urge to smoke.  Explain to your family and friends that you are quitting smoking. Ask for understanding and support.  Plan activities with friends or family where smoking is not an option. What are some ways I can cope with stress? Wanting to smoke may cause stress, and stress can make you want to smoke. Find ways to manage your stress. Relaxation techniques can help. For example:  Breathe slowly and deeply, in through your nose and out through your mouth.  Listen to soothing, relaxing music.  Talk with a family member or friend about your stress.  Light a candle.  Soak in a bath or take a shower.  Think about a peaceful place. What are some ways I can prevent weight gain? Be aware that many people gain weight after they quit smoking. However, not everyone does. To keep from gaining weight, have a plan in place before you quit and stick to the plan after you quit. Your plan should include:  Having healthy snacks. When you have a craving, it may help to: ? Eat plain popcorn, crunchy carrots, celery, or other cut vegetables. ? Chew sugar-free gum.  Changing how you eat: ? Eat small portion sizes at meals. ? Eat 4-6 small meals   throughout the day instead of 1-2 large meals a day. ? Be mindful when you eat. Do not watch television or do other things that might distract you as you eat.  Exercising regularly: ? Make time to exercise each day. If you do not have time for a long workout, do short bouts of exercise for 5-10 minutes several times a day. ? Do some form of strengthening exercise, like weight lifting, and some form of aerobic exercise, like running or swimming.  Drinking  plenty of water or other low-calorie or no-calorie drinks. Drink 6-8 glasses of water daily, or as much as instructed by your health care provider. Summary  Quitting smoking is a physical and mental challenge. You will face cravings, withdrawal symptoms, and temptation to smoke again. Preparation can help you as you go through these challenges.  You can cope with cravings by keeping your mouth busy (such as by chewing gum), keeping your body and hands busy, and making calls to family, friends, or a helpline for people who want to quit smoking.  You can cope with withdrawal symptoms by avoiding places where people smoke, avoiding drinks with caffeine, and getting plenty of rest.  Ask your health care provider about the different ways to prevent weight gain, avoid stress, and handle social situations. This information is not intended to replace advice given to you by your health care provider. Make sure you discuss any questions you have with your health care provider. Document Revised: 02/19/2017 Document Reviewed: 03/06/2016 Elsevier Patient Education  2020 Elsevier Inc.  

## 2019-09-20 LAB — COMPREHENSIVE METABOLIC PANEL
ALT: 29 U/L (ref 0–35)
AST: 35 U/L (ref 0–37)
Albumin: 4.3 g/dL (ref 3.5–5.2)
Alkaline Phosphatase: 53 U/L (ref 39–117)
BUN: 12 mg/dL (ref 6–23)
CO2: 28 mEq/L (ref 19–32)
Calcium: 9.4 mg/dL (ref 8.4–10.5)
Chloride: 95 mEq/L — ABNORMAL LOW (ref 96–112)
Creatinine, Ser: 0.6 mg/dL (ref 0.40–1.20)
GFR: 106.27 mL/min (ref >60.00)
Glucose, Bld: 75 mg/dL (ref 70–99)
Potassium: 3.5 mEq/L (ref 3.5–5.1)
Sodium: 134 mEq/L — ABNORMAL LOW (ref 135–145)
Total Bilirubin: 0.5 mg/dL (ref 0.2–1.2)
Total Protein: 7 g/dL (ref 6.0–8.3)

## 2019-09-20 LAB — CBC
HCT: 44.7 % (ref 36.0–46.0)
Hemoglobin: 15.2 g/dL — ABNORMAL HIGH (ref 12.0–15.0)
MCHC: 34 g/dL (ref 30.0–36.0)
MCV: 97.5 fl (ref 78.0–100.0)
Platelets: 214 10*3/uL (ref 150.0–400.0)
RBC: 4.59 Mil/uL (ref 3.87–5.11)
RDW: 15.3 % (ref 11.5–15.5)
WBC: 10.4 10*3/uL (ref 4.0–10.5)

## 2019-09-20 LAB — MICROALBUMIN / CREATININE URINE RATIO
Creatinine,U: 12.9 mg/dL
Microalb Creat Ratio: 5.4 mg/g (ref 0.0–30.0)
Microalb, Ur: <0.7 mg/dL (ref 0.0–1.9)

## 2019-09-20 LAB — URINALYSIS, ROUTINE W REFLEX MICROSCOPIC
Bilirubin Urine: NEGATIVE
Hgb urine dipstick: NEGATIVE
Ketones, ur: NEGATIVE
Leukocytes,Ua: NEGATIVE
Nitrite: NEGATIVE
RBC / HPF: NONE SEEN (ref 0–?)
Specific Gravity, Urine: <=1.005 — AB (ref 1.000–1.030)
Total Protein, Urine: NEGATIVE
Urine Glucose: NEGATIVE
Urobilinogen, UA: 0.2 (ref 0.0–1.0)
WBC, UA: NONE SEEN (ref 0–?)
pH: 6.5 (ref 5.0–8.0)

## 2019-09-20 LAB — LIPID PANEL
Cholesterol: 234 mg/dL — ABNORMAL HIGH (ref 0–200)
HDL: 45.4 mg/dL (ref >39.00)
LDL Cholesterol: 155 mg/dL — ABNORMAL HIGH (ref 0–99)
NonHDL: 188.81
Total CHOL/HDL Ratio: 5
Triglycerides: 170 mg/dL — ABNORMAL HIGH (ref 0.0–149.0)
VLDL: 34 mg/dL (ref 0.0–40.0)

## 2019-09-20 LAB — VITAMIN D 25 HYDROXY (VIT D DEFICIENCY, FRACTURES): VITD: 33.09 ng/mL (ref 30.00–100.00)

## 2019-09-20 LAB — LDL CHOLESTEROL, DIRECT: Direct LDL: 167 mg/dL

## 2019-09-26 ENCOUNTER — Ambulatory Visit: Payer: No Typology Code available for payment source | Admitting: Family Medicine

## 2019-09-26 NOTE — Progress Notes (Deleted)
NECHUMA BOVEN - 49 y.o. female MRN [id number]  Date of birth: 09/03/1970  SUBJECTIVE:  Including CC & ROS.  No chief complaint on file.   ARNETRA TERRIS is a 49 y.o. female that is  ***.  ***   Review of Systems See HPI   HISTORY: Past Medical, Surgical, Social, and Family History Reviewed & Updated per EMR.   Pertinent Historical Findings include:  Past Medical History:  Diagnosis Date  . Anemia   . Anxiety   . Back pain   . Depression   . Fatty liver   . GERD (gastroesophageal reflux disease)   . Heart murmur    When pregnant  . Hyperlipidemia   . Hypertension   . Joint pain   . Knee pain   . Muscle stiffness   . Phlebitis   . Shortness of breath   . Shortness of breath on exertion   . Sleep apnea   . Sleep apnea   . Stress   . Swelling of both lower extremities   . Trouble in sleeping     Past Surgical History:  Procedure Laterality Date  . OVARY SURGERY      Family History  Problem Relation Age of Onset  . Hypertension Mother   . Hyperlipidemia Mother   . Diabetes Mother   . Emphysema Mother   . Obesity Mother   . Heart disease Father   . Hypertension Father   . Hyperlipidemia Father   . Obesity Father   . Sleep apnea Father   . Alcoholism Father   . Stroke Brother   . Lung cancer Maternal Grandmother     Social History   Socioeconomic History  . Marital status: Married    Spouse name: Onalee Hua  . Number of children: 3  . Years of education: 71  . Highest education level: Not on file  Occupational History  . Occupation: Scientist, research (medical): [employer]  Tobacco Use  . Smoking status: Current Every Day Smoker    Packs/day: 0.50    Years: 20.00    Pack years: 10.00    Types: Cigarettes  . Smokeless tobacco: Never Used  Vaping Use  . Vaping Use: Never used  Substance and Sexual Activity  . Alcohol use: No  . Drug use: No  . Sexual activity: Yes    Birth control/protection: None  Other Topics Concern  . Not on file    Social History Narrative   Fun: Fishing, camping   Denies religious beliefs effecting health care.    Social Determinants of Health   Financial Resource Strain:   . Difficulty of Paying Living Expenses:   Food Insecurity:   . Worried About Programme researcher, broadcasting/film/video in the Last Year:   . Barista in the Last Year:   Transportation Needs:   . Freight forwarder (Medical):   Marland Kitchen Lack of Transportation (Non-Medical):   Physical Activity:   . Days of Exercise per Week:   . Minutes of Exercise per Session:   Stress:   . Feeling of Stress :   Social Connections:   . Frequency of Communication with Friends and Family:   . Frequency of Social Gatherings with Friends and Family:   . Attends Religious Services:   . Active Member of Clubs or Organizations:   . Attends Banker Meetings:   Marland Kitchen Marital Status:   Intimate Partner Violence:   . Fear of Current or Ex-Partner:   .  Emotionally Abused:   Marland Kitchen Physically Abused:   . Sexually Abused:      PHYSICAL EXAM:  VS: There were no vitals taken for this visit. Physical Exam Gen: NAD, alert, cooperative with exam, well-appearing MSK:  ***      ASSESSMENT & PLAN:   No problem-specific Assessment & Plan notes found for this encounter.

## 2019-10-12 ENCOUNTER — Other Ambulatory Visit: Payer: Self-pay | Admitting: Family Medicine

## 2019-10-12 DIAGNOSIS — I10 Essential (primary) hypertension: Secondary | ICD-10-CM

## 2019-10-12 MED FILL — AMLODIPINE BESYLATE 10 MG T: 10 | 90 days supply | Qty: 90 | Fill #0

## 2019-10-27 ENCOUNTER — Other Ambulatory Visit: Payer: Self-pay | Admitting: Family Medicine

## 2019-10-27 DIAGNOSIS — I10 Essential (primary) hypertension: Secondary | ICD-10-CM

## 2019-10-27 DIAGNOSIS — F17200 Nicotine dependence, unspecified, uncomplicated: Secondary | ICD-10-CM

## 2019-10-27 DIAGNOSIS — F32A Depression, unspecified: Secondary | ICD-10-CM

## 2019-10-27 MED FILL — CHLORTHALIDONE 25 MG TABS: 25 | 90 days supply | Qty: 90 | Fill #0

## 2019-10-27 MED FILL — buPROPion HCL ER (XL) 150 M: 150 | 90 days supply | Qty: 90 | Fill #0

## 2019-10-27 NOTE — Telephone Encounter (Signed)
Last OV 09/19/19 Last fill for both meds 10/24/18  #90/0  #100/1

## 2019-11-02 ENCOUNTER — Other Ambulatory Visit: Payer: Self-pay

## 2019-11-03 ENCOUNTER — Ambulatory Visit (INDEPENDENT_AMBULATORY_CARE_PROVIDER_SITE_OTHER): Payer: No Typology Code available for payment source | Admitting: Family Medicine

## 2019-11-03 ENCOUNTER — Encounter: Payer: Self-pay | Admitting: Family Medicine

## 2019-11-03 VITALS — BP 138/76 | HR 74 | Temp 97.0°F | Ht 66.0 in | Wt 260.0 lb

## 2019-11-03 DIAGNOSIS — F17200 Nicotine dependence, unspecified, uncomplicated: Secondary | ICD-10-CM | POA: Diagnosis not present

## 2019-11-03 DIAGNOSIS — I809 Phlebitis and thrombophlebitis of unspecified site: Secondary | ICD-10-CM

## 2019-11-03 DIAGNOSIS — E7800 Pure hypercholesterolemia, unspecified: Secondary | ICD-10-CM | POA: Insufficient documentation

## 2019-11-03 DIAGNOSIS — I10 Essential (primary) hypertension: Secondary | ICD-10-CM | POA: Diagnosis not present

## 2019-11-03 MED ORDER — CEPHALEXIN 500 MG PO CAPS: 500.0000 mg | ORAL_CAPSULE | Freq: Three times a day (TID) | ORAL | 0 refills | Status: AC

## 2019-11-03 MED FILL — CEPHALEXIN 500 MG CAPSULE: 500 | 10 days supply | Qty: 30 | Fill #0

## 2019-11-03 NOTE — Patient Instructions (Addendum)
Preventing High Cholesterol Cholesterol is a white, waxy substance similar to fat that the human body needs to help build cells. The liver makes all the cholesterol that a person's body needs. Having high cholesterol (hypercholesterolemia) increases a person's risk for heart disease and stroke. Extra (excess) cholesterol comes from the food the person eats. High cholesterol can often be prevented with diet and lifestyle changes. If you already have high cholesterol, you can control it with diet and lifestyle changes and with medicine. How can high cholesterol affect me? If you have high cholesterol, deposits (plaques) may build up on the walls of your arteries. The arteries are the blood vessels that carry blood away from your heart. Plaques make the arteries narrower and stiffer. This can limit or block blood flow and cause blood clots to form. Blood clots:  Are tiny balls of cells that form in your blood.  Can move to the heart or brain, causing a heart attack or stroke. Plaques in arteries greatly increase your risk for heart attack and stroke.Making diet and lifestyle changes can reduce your risk for these conditions that may threaten your life. What can increase my risk? This condition is more likely to develop in people who:  Eat foods that are high in saturated fat or cholesterol. Saturated fat is mostly found in: ? Foods that contain animal fat, such as red meat and some dairy products. ? Certain fatty foods made from plants, such as tropical oils.  Are overweight.  Are not getting enough exercise.  Have a family history of high cholesterol. What actions can I take to prevent this? Nutrition   Eat less saturated fat.  Avoid trans fats (partially hydrogenated oils). These are often found in margarine and in some baked goods, fried foods, and snacks bought in packages.  Avoid precooked or cured meat, such as sausages or meat loaves.  Avoid foods and drinks that have added  sugars.  Eat more fruits, vegetables, and whole grains.  Choose healthy sources of protein, such as fish, poultry, lean cuts of red meat, beans, peas, lentils, and nuts.  Choose healthy sources of fat, such as: ? Nuts. ? Vegetable oils, especially olive oil. ? Fish that have healthy fats (omega-3 fatty acids), such as mackerel or salmon. The items listed above may not be a complete list of recommended foods and beverages. Contact a dietitian for more information. Lifestyle  Lose weight if you are overweight. Losing 5-10 lb (2.3-4.5 kg) can help prevent or control high cholesterol. It can also lower your risk for diabetes and high blood pressure. Ask your health care provider to help you with a diet and exercise plan to lose weight safely.  Do not use any products that contain nicotine or tobacco, such as cigarettes, e-cigarettes, and chewing tobacco. If you need help quitting, ask your health care provider.  Limit your alcohol intake. ? Do not drink alcohol if:  Your health care provider tells you not to drink.  You are pregnant, may be pregnant, or are planning to become pregnant. ? If you drink alcohol:  Limit how much you use to:  0-1 drink a day for women.  0-2 drinks a day for men.  Be aware of how much alcohol is in your drink. In the [place] one drink equals one 12 oz bottle of beer (355 mL), one 5 oz glass of wine (148 mL), or one 1 oz glass of hard liquor (44 mL). Activity   Get enough exercise. Each week, do at   least 150 minutes of exercise that takes a medium level of effort (moderate-intensity exercise). ? This is exercise that:  Makes your heart beat faster and makes you breathe harder than usual.  Allows you to still be able to talk. ? You could exercise in short sessions several times a day or longer sessions a few times a week. For example, on 5 days each week, you could walk fast or ride your bike 3 times a day for 10 minutes each time.  Do exercises as told  by your health care provider. Medicines  In addition to diet and lifestyle changes, your health care provider may recommend medicines to help lower cholesterol. This may be a medicine to lower the amount of cholesterol your liver makes. You may need medicine if: ? Diet and lifestyle changes do not lower your cholesterol enough. ? You have high cholesterol and other risk factors for heart disease or stroke.  Take over-the-counter and prescription medicines only as told by your health care provider. General information  Manage your risk factors for high cholesterol. Talk with your health care provider about all your risk factors and how to lower your risk.  Manage other conditions that you have, such as diabetes or high blood pressure (hypertension).  Have blood tests to check your cholesterol levels at regular points in time as told by your health care provider.  Keep all follow-up visits as told by your health care provider. This is important. Where to find more information  American Heart Association: www.heart.org  National Heart, Lung, and Blood Institute: [employer].is Summary  High cholesterol increases your risk for heart disease and stroke. By keeping your cholesterol level low, you can reduce your risk for these conditions.  High cholesterol can often be prevented with diet and lifestyle changes.  Work with your health care provider to manage your risk factors, and have your blood tested regularly. This information is not intended to replace advice given to you by your health care provider. Make sure you discuss any questions you have with your health care provider. Document Revised: 07/01/2018 Document Reviewed: 11/16/2015 Elsevier Patient Education  2020 [employer] Inc[employer]  Phlebitis Phlebitis is soreness and swelling (inflammation) of a vein. This can occur in your arms, legs, or torso (trunk), as well as deeper inside your body. Phlebitis is usually not serious when it  occurs close to the surface of the body. However, it can cause serious problems when it occurs deeper inside the body. What are the causes? Phlebitis can be caused by:  Having a needle, IV tube, or long, thin tube (catheter) put in the vein.  Getting certain medicines or solutions through an IV tube. Some medicines or solutions, such as antibiotics and cancer medicines, can irritate the vein.  Having an IV tube for a long period of time.  Having an IV placed on the part of the arm or hand that moves a lot.  A blood clot.  Infection of the vein.  Surgery on a vein. What increases the risk? The following factors may make you more likely to develop this condition:  Being overweight or obese.  Pregnancy.  Cancer.  Reduced or slowing of blood flow through your veins. This can be caused by: ? Bed rest over a long period of time. ? Long distance travel. ? Injury. ? Surgery. ? Congestive heart failure. ? Inactivity.  Smoking.  Taking birth control pills or hormone replacement therapy.  Varicose veins.  Inflammatory diseases or blood disorders that increase clotting.  Taking illegal drugs through the vein.  Having a history of blood clots. What are the signs or symptoms? Symptoms of this condition include:  A red, tender, swollen, and painful area on your skin. Usually, the area is long and narrow, and may spread.  The affected area feeling warm to the touch.  Firmness along the center of the affected area.  Low fever. How is this diagnosed? This condition is diagnosed based on:  Your symptoms.  A physical exam.  Your medical history, including your family history. Other tests may be done to rule out other conditions, such as blood clots, especially if you have a history of blood clots or are at higher risk of developing blood clots. These may include:  Blood tests.  Ultrasound exam.  Genetic tests.  Biopsy. This is when a tissue is taken from the body  for examination. This is rare. How is this treated? Treatment depends on the severity of the condition as well as the location of the inflammation. In most cases, the condition is minor and gets better quickly. Treatment may include:  Applying a warm compress or heating pad.  Wearing compression stockings or bandages.  Medicines, such as: ? Anti-inflammatory medicines. ? Antibiotic medicines if an infection is present. ? Blood-thinning medicines if a blood clot is suspected or present, or if you have a history of blood clots or a blood disorder.  Removing an IV tube that may be causing the problem.  Using a different medicine or solution that will not irritate the vein. In rare cases, surgery may be needed to remove a damaged section of a vein. Follow these instructions at home: Managing pain, stiffness, and swelling  If directed, apply heat to the affected area as often as told by your health care provider. Use the heat source that your health care provider recommends, such as a moist heat pack or a heating pad. ? Place a towel between your skin and the heat source. ? Leave the heat on for 20-30 minutes. ? Remove the heat if your skin turns bright red. This is especially important if you are unable to feel pain, heat, or cold. You may have a greater risk of getting burned.  Raise (elevate) the affected area above the level of your heart while you are sitting or lying down. Medicines  Take over-the-counter and prescription medicines only as told by your health care provider.  If you were prescribed an antibiotic, take it as told by your health care provider. Do not stop using the antibiotic even if you start to feel better.  Carry a medical alert card or wear your medical alert jewelry to show that you are on blood thinners, if this applies. General instructions   If you have phlebitis in your legs: ? Avoid sitting or standing for a long time. ? Keep your legs moving. ? Try to  take short walks to break up long periods of sitting. ? Try to avoid bed rest that lasts for long periods of time. Regular sleep is not part of bed rest.  Wear compression stockings as told by your health care provider. These stockings reduce swelling in your legs and help to prevent blood clots. They also help to prevent the condition from coming back.  Do not use any products that contain nicotine or tobacco, such as cigarettes and e-cigarettes. If you need help quitting, ask your health care provider.  Keep all follow-up visits as told by your health care provider. This is important.  This may include any follow-up blood tests. Contact a health care provider if:  You have unusual bruising or any bleeding problems.  Your symptoms do not get better, or your symptoms get worse.  You are on anti-inflammatory medicines and you develop abdominal pain. Get help right away if:  You suddenly have chest pain or trouble breathing.  You have a fever and your symptoms suddenly get worse.  You cough up blood.  You feel lightheaded or pass out.  You have severe pain and swelling in the affected arm or leg. These symptoms may represent a serious problem that is an emergency. Do not wait to see if the symptoms will go away. Get medical help right away. Call your local emergency services (911 in the [place] Do not drive yourself to the hospital. Summary  Phlebitis is soreness and swelling (inflammation) of a vein.  Phlebitis is usually not serious when it occurs close to the surface of the body. However, it can cause serious problems when it occurs deeper inside the body.  Treatment depends on the severity of the condition and the location of the inflammation.  Raise (elevate) the affected area above the level of your heart while you are sitting or lying down. This information is not intended to replace advice given to you by your health care provider. Make sure you discuss any questions you have  with your health care provider. Document Revised: 04/19/2018 Document Reviewed: 04/14/2016 Elsevier Patient Education  2020 [employer].

## 2019-11-03 NOTE — Progress Notes (Signed)
Established Patient Office Visit  Subjective:  Patient ID: Angela Stark, female    DOB: [birth year]  Age: 49 y.o. MRN: [id number]  CC:  Chief Complaint  Patient presents with  . Acute Visit    C/O swollen tender to touch vein on upper right thigh patient noticed 2 days ago becoming larger in size.     HPI Angela Stark presents for for evaluation of a red and tender vein in her right upper thigh.  No history of DVT or PE.  No fevers.  She has lower the fat and cholesterol in her diet.  Continues smoking cessation efforts.  Trying to lose some weight.  Dad had coronary stents placed in his 29s.  Mom has high cholesterol as well.  Right ankle tends to swell during the day when dependent but resolves at night.  Past Medical History:  Diagnosis Date  . Anemia   . Anxiety   . Back pain   . Depression   . Fatty liver   . GERD (gastroesophageal reflux disease)   . Heart murmur    When pregnant  . Hyperlipidemia   . Hypertension   . Joint pain   . Knee pain   . Muscle stiffness   . Phlebitis   . Shortness of breath   . Shortness of breath on exertion   . Sleep apnea   . Sleep apnea   . Stress   . Swelling of both lower extremities   . Trouble in sleeping     Past Surgical History:  Procedure Laterality Date  . OVARY SURGERY      Family History  Problem Relation Age of Onset  . Hypertension Mother   . Hyperlipidemia Mother   . Diabetes Mother   . Emphysema Mother   . Obesity Mother   . Heart disease Father   . Hypertension Father   . Hyperlipidemia Father   . Obesity Father   . Sleep apnea Father   . Alcoholism Father   . Stroke Brother   . Lung cancer Maternal Grandmother     Social History   Socioeconomic History  . Marital status: Married    Spouse name: Onalee Hua  . Number of children: 3  . Years of education: 84  . Highest education level: Not on file  Occupational History  . Occupation: Scientist, research (medical): [employer]  Tobacco Use  .  Smoking status: Current Every Day Smoker    Packs/day: 0.50    Years: 20.00    Pack years: 10.00    Types: Cigarettes  . Smokeless tobacco: Never Used  Vaping Use  . Vaping Use: Never used  Substance and Sexual Activity  . Alcohol use: No  . Drug use: No  . Sexual activity: Yes    Birth control/protection: None  Other Topics Concern  . Not on file  Social History Narrative   Fun: Fishing, camping   Denies religious beliefs effecting health care.    Social Determinants of Health   Financial Resource Strain:   . Difficulty of Paying Living Expenses:   Food Insecurity:   . Worried About Programme researcher, broadcasting/film/video in the Last Year:   . Barista in the Last Year:   Transportation Needs:   . Freight forwarder (Medical):   Marland Kitchen Lack of Transportation (Non-Medical):   Physical Activity:   . Days of Exercise per Week:   . Minutes of Exercise per Session:   Stress:   .  Feeling of Stress :   Social Connections:   . Frequency of Communication with Friends and Family:   . Frequency of Social Gatherings with Friends and Family:   . Attends Religious Services:   . Active Member of Clubs or Organizations:   . Attends Banker Meetings:   Marland Kitchen Marital Status:   Intimate Partner Violence:   . Fear of Current or Ex-Partner:   . Emotionally Abused:   Marland Kitchen Physically Abused:   . Sexually Abused:     Outpatient Medications Prior to Visit  Medication Sig Dispense Refill  . amLODipine (NORVASC) 10 MG tablet TAKE 1 TABLET BY MOUTH DAILY. 90 tablet 1  . buPROPion (WELLBUTRIN XL) 150 MG 24 hr tablet TAKE 1 TABLET BY MOUTH DAILY. 90 tablet 1  . chlorthalidone (HYGROTON) 25 MG tablet TAKE 1 TABLET BY MOUTH DAILY. 90 tablet 1  . Ibuprofen-Famotidine 800-26.6 MG TABS Take 1 tablet by mouth 3 (three) times daily. 90 tablet 3  . Vitamin D, Ergocalciferol, (DRISDOL) 1.25 MG (50000 UT) CAPS capsule Take 1 capsule (50,000 Units total) by mouth every 7 (seven) days. 12 capsule 1  .  Diclofenac Sodium (PENNSAID) 2 % SOLN Place 1 application onto the skin 2 (two) times daily. (Patient not taking: Reported on 09/19/2019) 1 Bottle 3  . varenicline (CHANTIX PAK) 0.5 MG X 11 & 1 MG X 42 tablet Take one 0.5 mg tablet by mouth once daily for 3 days, then increase to one 0.5 mg tablet twice daily for 4 days, then increase to one 1 mg tablet twice daily. (Patient not taking: Reported on 09/19/2019) 53 tablet 0   No facility-administered medications prior to visit.    No Known Allergies  ROS Review of Systems  Constitutional: Negative.  Negative for diaphoresis.  HENT: Negative.   Eyes: Negative for photophobia and visual disturbance.  Respiratory: Negative for chest tightness, shortness of breath and wheezing.   Cardiovascular: Negative for chest pain and palpitations.  Gastrointestinal: Negative for nausea and vomiting.  Genitourinary: Negative.   Musculoskeletal: Negative for gait problem and myalgias.  Allergic/Immunologic: Negative for immunocompromised state.  Neurological: Negative for speech difficulty and weakness.  Hematological: Does not bruise/bleed easily.  Psychiatric/Behavioral: Negative.       Objective:     Physical Exam Vitals and nursing note reviewed.  Constitutional:      Appearance: Normal appearance.  HENT:     Head: Normocephalic and atraumatic.     Right Ear: External ear normal.     Left Ear: External ear normal.  Eyes:     General: No scleral icterus.       Right eye: No discharge.        Left eye: No discharge.     Conjunctiva/sclera: Conjunctivae normal.  Cardiovascular:     Pulses:          Dorsalis pedis pulses are 2+ on the right side.       Posterior tibial pulses are 1+ on the right side.  Pulmonary:     Effort: Pulmonary effort is normal.  Skin:    General: Skin is warm and dry.       Neurological:     Mental Status: She is alert and oriented to person, place, and time.  Psychiatric:        Mood and Affect: Mood normal.         Behavior: Behavior normal.     BP 138/76   Pulse 74   Temp (!) 97 F (  36.1 C) (Tympanic)   Ht 5\' 6"  (1.676 m)   Wt 260 lb (117.9 kg)   SpO2 97%   BMI 41.97 kg/m  Wt Readings from Last 3 Encounters:  11/03/19 260 lb (117.9 kg)  09/19/19 256 lb 3.2 oz (116.2 kg)  09/09/18 252 lb 4 oz (114.4 kg)     Health Maintenance Due  Topic Date Due  . Hepatitis C Screening  Never done  . INFLUENZA VACCINE  10/22/2019    There are no preventive care reminders to display for this patient.  Lab Results  Component Value Date   TSH 2.230 10/18/2017   Lab Results  Component Value Date   WBC 10.4 09/19/2019   HGB 15.2 (H) 09/19/2019   HCT 44.7 09/19/2019   MCV 97.5 09/19/2019   PLT 214.0 09/19/2019   Lab Results  Component Value Date   NA 134 (L) 09/19/2019   K 3.5 09/19/2019   CO2 28 09/19/2019   GLUCOSE 75 09/19/2019   BUN 12 09/19/2019   CREATININE 0.60 09/19/2019   BILITOT 0.5 09/19/2019   ALKPHOS 53 09/19/2019   AST 35 09/19/2019   ALT 29 09/19/2019   PROT 7.0 09/19/2019   ALBUMIN 4.3 09/19/2019   CALCIUM 9.4 09/19/2019   GFR 106.27 09/19/2019   Lab Results  Component Value Date   CHOL 234 (H) 09/19/2019   Lab Results  Component Value Date   HDL 45.40 09/19/2019   Lab Results  Component Value Date   LDLCALC 155 (H) 09/19/2019   Lab Results  Component Value Date   TRIG 170.0 (H) 09/19/2019   Lab Results  Component Value Date   CHOLHDL 5 09/19/2019   Lab Results  Component Value Date   HGBA1C 5.3 10/18/2017      Assessment & Plan:   Problem List Items Addressed This Visit      Cardiovascular and Mediastinum   Essential hypertension   Phlebitis - Primary   Relevant Medications   cephALEXin (KEFLEX) 500 MG capsule     Other   Tobacco use disorder   Elevated LDL cholesterol level      Meds ordered this encounter  Medications  . cephALEXin (KEFLEX) 500 MG capsule    Sig: Take 1 capsule (500 mg total) by mouth 3 (three) times daily  for 10 days.    Dispense:  30 capsule    Refill:  0    Follow-up: Return in about 3 months (around 02/03/2020), or elevate leg with heating pad for 30 minutes three times daily. Continue smoking cessation efforts!.   Information given on phlebitis and preventing high cholesterol.  Encouraged her to continue smoking cessation efforts as well as weight loss.  She is aware of increased risk for vascular disease.  May consider a statin in 3 months.  Will return if phlebitis does not respond to heat elevation in the cephalexin. 02/05/2020, MD

## 2020-01-19 ENCOUNTER — Telehealth: Payer: Self-pay | Admitting: Family Medicine

## 2020-01-19 NOTE — Telephone Encounter (Signed)
Patient was scheduled for an appointment with Dr. Doreene Burke on 02/06/2020, but he will be out of office. Two call attempts and letter sent.

## 2020-01-23 ENCOUNTER — Ambulatory Visit (INDEPENDENT_AMBULATORY_CARE_PROVIDER_SITE_OTHER): Payer: No Typology Code available for payment source | Admitting: Family Medicine

## 2020-01-23 ENCOUNTER — Other Ambulatory Visit: Payer: Self-pay

## 2020-01-23 ENCOUNTER — Ambulatory Visit: Payer: Self-pay

## 2020-01-23 ENCOUNTER — Encounter: Payer: Self-pay | Admitting: Family Medicine

## 2020-01-23 VITALS — BP 151/92 | HR 57 | Ht 67.0 in | Wt 250.0 lb

## 2020-01-23 DIAGNOSIS — M170 Bilateral primary osteoarthritis of knee: Secondary | ICD-10-CM

## 2020-01-23 MED ORDER — TRIAMCINOLONE ACETONIDE 40 MG/ML IJ SUSP
40.0000 mg | Freq: Once | INTRAMUSCULAR | Status: AC
  Administered 2020-01-23: 40 mg via INTRA_ARTICULAR

## 2020-01-23 NOTE — Progress Notes (Signed)
Angela Stark - 49 y.o. female MRN [id number]  Date of birth: 27-Jan-1971  SUBJECTIVE:  Including CC & ROS.  Chief Complaint  Patient presents with  . Knee Pain    bilateral    Angela Stark is a 49 y.o. female that is presenting with acute on chronic bilateral knee pain.  She has had injections previously.  Her pain is gotten worse over the past year.  Denies any falls or injuries.  Has tried many anti-inflammatories with limited improvement.  Pain is occurring over the anterior medial aspect of the knee.   Review of Systems See HPI   HISTORY: Past Medical, Surgical, Social, and Family History Reviewed & Updated per EMR.   Pertinent Historical Findings include:  Past Medical History:  Diagnosis Date  . Anemia   . Anxiety   . Back pain   . Depression   . Fatty liver   . GERD (gastroesophageal reflux disease)   . Heart murmur    When pregnant  . Hyperlipidemia   . Hypertension   . Joint pain   . Knee pain   . Muscle stiffness   . Phlebitis   . Shortness of breath   . Shortness of breath on exertion   . Sleep apnea   . Sleep apnea   . Stress   . Swelling of both lower extremities   . Trouble in sleeping     Past Surgical History:  Procedure Laterality Date  . OVARY SURGERY      Family History  Problem Relation Age of Onset  . Hypertension Mother   . Hyperlipidemia Mother   . Diabetes Mother   . Emphysema Mother   . Obesity Mother   . Heart disease Father   . Hypertension Father   . Hyperlipidemia Father   . Obesity Father   . Sleep apnea Father   . Alcoholism Father   . Stroke Brother   . Lung cancer Maternal Grandmother     Social History   Socioeconomic History  . Marital status: Married    Spouse name: Onalee Hua  . Number of children: 3  . Years of education: 77  . Highest education level: Not on file  Occupational History  . Occupation: Scientist, research (medical): [employer]  Tobacco Use  . Smoking status: Current Every Day Smoker     Packs/day: 0.50    Years: 20.00    Pack years: 10.00    Types: Cigarettes  . Smokeless tobacco: Never Used  Vaping Use  . Vaping Use: Never used  Substance and Sexual Activity  . Alcohol use: No  . Drug use: No  . Sexual activity: Yes    Birth control/protection: None  Other Topics Concern  . Not on file  Social History Narrative   Fun: Fishing, camping   Denies religious beliefs effecting health care.    Social Determinants of Health   Financial Resource Strain:   . Difficulty of Paying Living Expenses: Not on file  Food Insecurity:   . Worried About Programme researcher, broadcasting/film/video in the Last Year: Not on file  . Ran Out of Food in the Last Year: Not on file  Transportation Needs:   . Lack of Transportation (Medical): Not on file  . Lack of Transportation (Non-Medical): Not on file  Physical Activity:   . Days of Exercise per Week: Not on file  . Minutes of Exercise per Session: Not on file  Stress:   . Feeling of Stress :  Not on file  Social Connections:   . Frequency of Communication with Friends and Family: Not on file  . Frequency of Social Gatherings with Friends and Family: Not on file  . Attends Religious Services: Not on file  . Active Member of Clubs or Organizations: Not on file  . Attends Banker Meetings: Not on file  . Marital Status: Not on file  Intimate Partner Violence:   . Fear of Current or Ex-Partner: Not on file  . Emotionally Abused: Not on file  . Physically Abused: Not on file  . Sexually Abused: Not on file     PHYSICAL EXAM:  VS: BP (!) 151/92   Pulse (!) 57   Ht 5\' 7"  (1.702 m)   Wt 250 lb (113.4 kg)   BMI 39.16 kg/m  Physical Exam Gen: NAD, alert, cooperative with exam, well-appearing MSK:  Right and left knee: No effusion noted. Tenderness palpation of the medial joint space. Normal range of motion. No instability. Neurovascularly intact   Aspiration/Injection Procedure Note Angela Stark Apr 09, 1970  Procedure: Aspiration and Injection Indications: Left knee pain  Procedure Details Consent: Risks of procedure as well as the alternatives and risks of each were explained to the (patient/caregiver).  Consent for procedure obtained. Time Out: Verified patient identification, verified procedure, site/side was marked, verified correct patient position, special equipment/implants available, medications/allergies/relevent history reviewed, required imaging and test results available.  Performed.  The area was cleaned with iodine and alcohol swabs.    The left knee superior lateral suprapatellar pouch was injected using 5 cc of 1% lidocaine on a 25-gauge 1-1/2 inch needle.  An 18-gauge 1-1/2 inch needle was used to achieve aspiration.  The syringe was switched and a mixture containing 1 cc's of 40 mg Kenalog and 4 cc's of 0.25% bupivacaine was injected.  Ultrasound was used. Images were obtained in long views showing the injection.    Amount of Fluid Aspirated: 42mL Character of Fluid: clear and straw colored Fluid was sent for:n/a A sterile dressing was applied.  Patient did tolerate procedure well.  Aspiration/Injection Procedure Note Angela Stark 1970-07-22  Procedure: Aspiration and Injection Indications: Right knee pain  Procedure Details Consent: Risks of procedure as well as the alternatives and risks of each were explained to the (patient/caregiver).  Consent for procedure obtained. Time Out: Verified patient identification, verified procedure, site/side was marked, verified correct patient position, special equipment/implants available, medications/allergies/relevent history reviewed, required imaging and test results available.  Performed.  The area was cleaned with iodine and alcohol swabs.    The right knee superior lateral suprapatellar pouch was injected using 5 cc of 1% lidocaine on a 25-gauge 1-1/2 inch needle.  An 18-gauge 1-1/2 inch needle was  used to achieve aspiration.  The syringe was switched and a mixture containing 1 cc's of 40 mg Kenalog and 4 cc's of 0.25% bupivacaine was injected.  Ultrasound was used. Images were obtained in long views showing the injection.    Amount of Fluid Aspirated: 61mL Character of Fluid: clear and red colored Fluid was sent for:n/a  A sterile dressing was applied.  Patient did tolerate procedure well.   ASSESSMENT & PLAN:   Primary osteoarthritis of both knees Acute on chronic in nature.  Pain is been ongoing for a few years.  She has had repeated steroid injections as well as gel injections. -Counseled on home exercise therapy and supportive care. -Bilateral injections. -MRI of the right and left knee to evaluate for internal  derangement.

## 2020-01-23 NOTE — Assessment & Plan Note (Signed)
Acute on chronic in nature.  Pain is been ongoing for a few years.  She has had repeated steroid injections as well as gel injections. -Counseled on home exercise therapy and supportive care. -Bilateral injections. -MRI of the right and left knee to evaluate for internal derangement.

## 2020-01-23 NOTE — Patient Instructions (Signed)
Good to see you Please try ice  Please continue to stay active   Please send me a message in MyChart with any questions or updates.  We will setup a virtual visit once the MRI's are resulted.   --Dr. Jordan Likes

## 2020-02-06 ENCOUNTER — Ambulatory Visit: Payer: No Typology Code available for payment source | Admitting: Family Medicine

## 2020-03-04 ENCOUNTER — Other Ambulatory Visit: Payer: Self-pay | Admitting: Family Medicine

## 2020-03-04 ENCOUNTER — Other Ambulatory Visit: Payer: Self-pay | Admitting: Family

## 2020-03-04 DIAGNOSIS — F419 Anxiety disorder, unspecified: Secondary | ICD-10-CM

## 2020-03-04 DIAGNOSIS — F17200 Nicotine dependence, unspecified, uncomplicated: Secondary | ICD-10-CM

## 2020-03-04 DIAGNOSIS — I10 Essential (primary) hypertension: Secondary | ICD-10-CM

## 2020-03-04 MED FILL — AMLODIPINE BESYLATE 10 MG T: 10 | 90 days supply | Qty: 90 | Fill #0

## 2020-03-04 MED FILL — buPROPion HCL ER (XL) 150 M: 150 | 90 days supply | Qty: 90 | Fill #0

## 2020-03-04 NOTE — Telephone Encounter (Signed)
Please see message and advise.  Thank you. Last fill for Amlodipine 10/12/19  #90/1 Last fill for Bupropion 10/27/19  #90/1 Last OV 11/03/18

## 2020-08-09 ENCOUNTER — Other Ambulatory Visit: Payer: Self-pay

## 2020-08-09 ENCOUNTER — Ambulatory Visit (INDEPENDENT_AMBULATORY_CARE_PROVIDER_SITE_OTHER): Payer: No Typology Code available for payment source | Admitting: Family Medicine

## 2020-08-09 ENCOUNTER — Other Ambulatory Visit: Payer: Self-pay | Admitting: Family Medicine

## 2020-08-09 ENCOUNTER — Encounter: Payer: Self-pay | Admitting: Family Medicine

## 2020-08-09 ENCOUNTER — Telehealth: Payer: Self-pay | Admitting: Family Medicine

## 2020-08-09 ENCOUNTER — Other Ambulatory Visit (HOSPITAL_COMMUNITY): Payer: Self-pay

## 2020-08-09 VITALS — BP 146/80 | HR 69 | Temp 97.9°F | Ht 67.0 in | Wt 259.2 lb

## 2020-08-09 DIAGNOSIS — E041 Nontoxic single thyroid nodule: Secondary | ICD-10-CM

## 2020-08-09 DIAGNOSIS — R011 Cardiac murmur, unspecified: Secondary | ICD-10-CM

## 2020-08-09 DIAGNOSIS — Z0000 Encounter for general adult medical examination without abnormal findings: Secondary | ICD-10-CM

## 2020-08-09 DIAGNOSIS — Z6841 Body Mass Index (BMI) 40.0 and over, adult: Secondary | ICD-10-CM

## 2020-08-09 DIAGNOSIS — I10 Essential (primary) hypertension: Secondary | ICD-10-CM

## 2020-08-09 DIAGNOSIS — M170 Bilateral primary osteoarthritis of knee: Secondary | ICD-10-CM

## 2020-08-09 DIAGNOSIS — E66813 Obesity, class 3: Secondary | ICD-10-CM

## 2020-08-09 HISTORY — DX: Nontoxic single thyroid nodule: E04.1

## 2020-08-09 MED ORDER — PLENITY WELCOME KIT PO CAPS
1.0000 | ORAL_CAPSULE | Freq: Two times a day (BID) | ORAL | 1 refills | Status: DC
Start: 2020-08-09 — End: 2021-09-17
  Filled 2020-08-09: qty 60, fill #0

## 2020-08-09 NOTE — Telephone Encounter (Signed)
Dr. Doreene Burke, okay to send referral?

## 2020-08-09 NOTE — Telephone Encounter (Signed)
Please see message from pharmacy.

## 2020-08-09 NOTE — Telephone Encounter (Signed)
Spoke to pt told her referral for Sports Medicine was sent. Pt verbalized understanding. Referral placed in Epic.

## 2020-08-09 NOTE — Telephone Encounter (Signed)
No alternative

## 2020-08-09 NOTE — Telephone Encounter (Signed)
Pt needing a new referral to Dr. Jordan Likes at [facility] in [facility] please. This is a yearly requirement by her insurance [employer]).

## 2020-08-09 NOTE — Progress Notes (Addendum)
Established Patient Office Visit  Subjective:  Patient ID: Angela Stark, female    DOB: 1971/01/27  Age: 50 y.o. MRN: [id number]  CC:  Chief Complaint  Patient presents with  . Hypertension    Pt here for f/u on blood pressure. She has been checking pressure occassionally. Took Bp at work a week ago due to having some blurred vision. This AM BP was 110/70. Denies headaches    HPI Angela Stark presents for follow-up of hypertension.  Blood pressure has been running in the 140s over 80s.  Patient had discontinued the chlorthalidone because she was thinking it was just for swelling.  She had no issues taking the medication.  She experienced 1 episode of palpitations upon awakening a few days ago.  They resolve spontaneously.  Pulse rate was measured at 99.  There was no chest pain shortness of breath nausea or diaphoresis.  She believes that she may be menopausal.  Otherwise doing well.  Due for a physical next month.  Interested in going ahead with a colonoscopy.  Her knees continue to bother her.  Past Medical History:  Diagnosis Date  . Anemia   . Anxiety   . Back pain   . Depression   . Fatty liver   . GERD (gastroesophageal reflux disease)   . Heart murmur    When pregnant  . Hyperlipidemia   . Hypertension   . Joint pain   . Knee pain   . Muscle stiffness   . Phlebitis   . Shortness of breath   . Shortness of breath on exertion   . Sleep apnea   . Sleep apnea   . Stress   . Swelling of both lower extremities   . Trouble in sleeping     Past Surgical History:  Procedure Laterality Date  . OVARY SURGERY      Family History  Problem Relation Age of Onset  . Hypertension Mother   . Hyperlipidemia Mother   . Diabetes Mother   . Emphysema Mother   . Obesity Mother   . Heart disease Father   . Hypertension Father   . Hyperlipidemia Father   . Obesity Father   . Sleep apnea Father   . Alcoholism Father   . Stroke Brother   . Lung cancer Maternal  Grandmother     Social History   Socioeconomic History  . Marital status: Married    Spouse name: Shanon Brow  . Number of children: 3  . Years of education: 89  . Highest education level: Not on file  Occupational History  . Occupation: Forensic psychologist: [employer]  Tobacco Use  . Smoking status: Current Every Day Smoker    Packs/day: 0.50    Years: 20.00    Pack years: 10.00    Types: Cigarettes  . Smokeless tobacco: Never Used  Vaping Use  . Vaping Use: Never used  Substance and Sexual Activity  . Alcohol use: No  . Drug use: No  . Sexual activity: Yes    Birth control/protection: None  Other Topics Concern  . Not on file  Social History Narrative   Fun: Fishing, camping   Denies religious beliefs effecting health care.    Social Determinants of Health   Financial Resource Strain: Not on file  Food Insecurity: Not on file  Transportation Needs: Not on file  Physical Activity: Not on file  Stress: Not on file  Social Connections: Not on file  Intimate Partner Violence:  Not on file    Outpatient Medications Prior to Visit  Medication Sig Dispense Refill  . amLODipine (NORVASC) 10 MG tablet TAKE 1 TABLET BY MOUTH ONCE DAILY 90 tablet 1  . buPROPion (WELLBUTRIN XL) 150 MG 24 hr tablet TAKE 1 TABLET BY MOUTH DAILY. 90 tablet 1  . Diclofenac Sodium (PENNSAID) 2 % SOLN Place 1 application onto the skin 2 (two) times daily. 1 Bottle 3  . Ibuprofen-Famotidine 800-26.6 MG TABS Take 1 tablet by mouth 3 (three) times daily. 90 tablet 3  . varenicline (CHANTIX PAK) 0.5 MG X 11 & 1 MG X 42 tablet Take one 0.5 mg tablet by mouth once daily for 3 days, then increase to one 0.5 mg tablet twice daily for 4 days, then increase to one 1 mg tablet twice daily. (Patient not taking: No sig reported) 53 tablet 0  . chlorthalidone (HYGROTON) 25 MG tablet TAKE 1 TABLET BY MOUTH DAILY. 90 tablet 1  . Vitamin D, Ergocalciferol, (DRISDOL) 1.25 MG (50000 UT) CAPS capsule Take 1 capsule (50,000  Units total) by mouth every 7 (seven) days. 12 capsule 1   No facility-administered medications prior to visit.    No Known Allergies  ROS Review of Systems  Constitutional: Negative.  Negative for diaphoresis.  HENT: Negative.   Eyes: Negative for photophobia and visual disturbance.  Respiratory: Negative for chest tightness, shortness of breath and wheezing.   Cardiovascular: Positive for palpitations. Negative for chest pain.  Gastrointestinal: Negative for nausea and vomiting.  Genitourinary: Negative.   Musculoskeletal: Positive for arthralgias.  Skin: Negative for pallor and rash.  Allergic/Immunologic: Negative for immunocompromised state.  Neurological: Negative for light-headedness and headaches.  Psychiatric/Behavioral: Negative.       Objective:    Physical Exam Vitals and nursing note reviewed.  Constitutional:      General: She is not in acute distress.    Appearance: Normal appearance. She is obese. She is not ill-appearing, toxic-appearing or diaphoretic.  HENT:     Head: Normocephalic and atraumatic.     Right Ear: Tympanic membrane, ear canal and external ear normal.     Left Ear: Tympanic membrane, ear canal and external ear normal.     Mouth/Throat:     Mouth: Mucous membranes are moist.     Pharynx: Oropharynx is clear. No oropharyngeal exudate or posterior oropharyngeal erythema.  Eyes:     General: No scleral icterus.       Right eye: No discharge.        Left eye: No discharge.     Conjunctiva/sclera: Conjunctivae normal.     Pupils: Pupils are equal, round, and reactive to light.  Neck:     Thyroid: Thyroid mass (on right. ) present.     Vascular: No carotid bruit.  Cardiovascular:     Rate and Rhythm: Normal rate and regular rhythm.     Heart sounds: Murmur heard.   Systolic murmur is present with a grade of 1/6.  No diastolic murmur is present.   Pulmonary:     Effort: Pulmonary effort is normal.     Breath sounds: Normal breath  sounds.  Musculoskeletal:     Cervical back: No rigidity or tenderness.  Lymphadenopathy:     Cervical: No cervical adenopathy.  Skin:    General: Skin is warm and dry.  Neurological:     Mental Status: She is alert and oriented to person, place, and time.  Psychiatric:        Mood and Affect:  Mood normal.        Behavior: Behavior normal.     BP (!) 146/80 (BP Location: Left Arm, Patient Position: Sitting, Cuff Size: Large)   Pulse 69   Temp 97.9 F (36.6 C) (Temporal)   Ht _0  (1.702 m)   Wt 259 lb 4 oz (117.6 kg)   SpO2 97%   BMI 40.60 kg/m  Wt Readings from Last 3 Encounters:  08/09/20 259 lb 4 oz (117.6 kg)  01/23/20 250 lb (113.4 kg)  11/03/19 260 lb (117.9 kg)     Health Maintenance Due  Topic Date Due  . Hepatitis C Screening  Never done  . COLONOSCOPY (Pts 45-7yr Insurance coverage will need to be confirmed)  Never done  . COVID-19 Vaccine (3 - Booster for Pfizer series) 12/21/2019    There are no preventive care reminders to display for this patient.  Lab Results  Component Value Date   TSH 4.24 08/09/2020   Lab Results  Component Value Date   WBC 10.4 09/19/2019   HGB 15.2 (H) 09/19/2019   HCT 44.7 09/19/2019   MCV 97.5 09/19/2019   PLT 214.0 09/19/2019   Lab Results  Component Value Date   NA 134 (L) 09/19/2019   K 3.5 09/19/2019   CO2 28 09/19/2019   GLUCOSE 75 09/19/2019   BUN 12 09/19/2019   CREATININE 0.60 09/19/2019   BILITOT 0.5 09/19/2019   ALKPHOS 53 09/19/2019   AST 35 09/19/2019   ALT 29 09/19/2019   PROT 7.0 09/19/2019   ALBUMIN 4.3 09/19/2019   CALCIUM 9.4 09/19/2019   GFR 106.27 09/19/2019   Lab Results  Component Value Date   CHOL 234 (H) 09/19/2019   Lab Results  Component Value Date   HDL 45.40 09/19/2019   Lab Results  Component Value Date   LDLCALC 155 (H) 09/19/2019   Lab Results  Component Value Date   TRIG 170.0 (H) 09/19/2019   Lab Results  Component Value Date   CHOLHDL 5 09/19/2019   Lab  Results  Component Value Date   HGBA1C 5.3 10/18/2017      Assessment & Plan:   Problem List Items Addressed This Visit      Cardiovascular and Mediastinum   Essential hypertension - Primary   Relevant Medications   chlorthalidone (HYGROTON) 25 MG tablet     Endocrine   Thyroid nodule   Relevant Orders   UKoreaTHYROID   Thyroid Panel With TSH (Completed)     Other   Healthcare maintenance   Relevant Orders   Ambulatory referral to Gastroenterology   Class 3 severe obesity due to excess calories with body mass index (BMI) of 40.0 to 44.9 in adult ([facility]   Relevant Medications   Carboxymeth-Cellulose-CitricAc (PLENITY WELCOME KIT) CAPS   Heart murmur   Relevant Orders   Ambulatory referral to Cardiology      Meds ordered this encounter  Medications  . Carboxymeth-Cellulose-CitricAc (PLENITY WELCOME KIT) CAPS    Sig: Take 1 capsule by mouth 2 (two) times daily with lunch and supper and a whole glass of water.    Dispense:  60 capsule    Refill:  1  . chlorthalidone (HYGROTON) 25 MG tablet    Sig: Take 1 tablet (25 mg total) by mouth daily.    Dispense:  100 tablet    Refill:  1    Follow-up: Return restart chlorthalidone..   Follow-up for physical next month. WLibby Maw MD

## 2020-08-10 LAB — THYROID PANEL WITH TSH
Free Thyroxine Index: 2 (ref 1.4–3.8)
T3 Uptake: 29 % (ref 22–35)
T4, Total: 6.9 ug/dL (ref 5.1–11.9)
TSH: 4.24 mIU/L

## 2020-08-12 ENCOUNTER — Other Ambulatory Visit (HOSPITAL_COMMUNITY): Payer: Self-pay

## 2020-08-14 ENCOUNTER — Other Ambulatory Visit (HOSPITAL_COMMUNITY): Payer: Self-pay

## 2020-08-14 MED FILL — Amlodipine Besylate Tab 10 MG (Base Equivalent): ORAL | 90 days supply | Qty: 90 | Fill #0 | Status: AC

## 2020-08-14 MED FILL — Bupropion HCl Tab ER 24HR 150 MG: ORAL | 90 days supply | Qty: 90 | Fill #0 | Status: AC

## 2020-08-16 ENCOUNTER — Other Ambulatory Visit (HOSPITAL_COMMUNITY): Payer: Self-pay

## 2020-08-16 ENCOUNTER — Telehealth: Payer: Self-pay | Admitting: Family Medicine

## 2020-08-16 ENCOUNTER — Encounter: Payer: Self-pay | Admitting: Family Medicine

## 2020-08-16 MED ORDER — CHLORTHALIDONE 25 MG PO TABS
25.0000 mg | ORAL_TABLET | Freq: Every day | ORAL | 1 refills | Status: DC
  Filled 2020-08-16 – 2020-08-30 (×2): qty 90, 90d supply, fill #0
  Filled 2020-11-17: qty 90, 90d supply, fill #1
  Filled 2021-04-15: qty 20, 20d supply, fill #2

## 2020-08-16 NOTE — Telephone Encounter (Signed)
Pt is under the impression she is going to get a fluid pill to help with her bp. She says she stopped taking this because her ankles stopped swelling, but she needs to get back on it. Pharmacy  [facility]  [address], [place] [address]  Phone:  [phone] Fax:  [phone]  DEA #:  [id number]   Please advise pt

## 2020-08-16 NOTE — Addendum Note (Signed)
Addended by: Andrez Grime on: 08/16/2020 12:34 PM   Modules accepted: Orders

## 2020-08-16 NOTE — Telephone Encounter (Signed)
Pending/duplicate message waiting for approval to have medications sent in. Patient informed of this

## 2020-08-24 ENCOUNTER — Other Ambulatory Visit (HOSPITAL_COMMUNITY): Payer: Self-pay

## 2020-08-26 ENCOUNTER — Ambulatory Visit
Admission: RE | Admit: 2020-08-26 | Discharge: 2020-08-26 | Disposition: A | Discharge status: Home / Self Care | Payer: No Typology Code available for payment source | Source: Ambulatory Visit | Attending: Family Medicine | Admitting: Family Medicine

## 2020-08-26 DIAGNOSIS — E041 Nontoxic single thyroid nodule: Secondary | ICD-10-CM

## 2020-08-30 ENCOUNTER — Other Ambulatory Visit (HOSPITAL_COMMUNITY): Payer: Self-pay

## 2020-09-02 ENCOUNTER — Encounter: Payer: Self-pay | Admitting: Family Medicine

## 2020-09-02 ENCOUNTER — Other Ambulatory Visit (HOSPITAL_COMMUNITY): Payer: Self-pay

## 2020-09-02 DIAGNOSIS — Z6841 Body Mass Index (BMI) 40.0 and over, adult: Secondary | ICD-10-CM

## 2020-09-02 MED ORDER — SEMAGLUTIDE-WEIGHT MANAGEMENT 0.25 MG/0.5ML ~~LOC~~ SOAJ
0.2500 mg | SUBCUTANEOUS | 5 refills | Status: DC
  Filled 2020-09-02: qty 2, 28d supply, fill #0

## 2020-09-03 ENCOUNTER — Other Ambulatory Visit (HOSPITAL_COMMUNITY): Payer: Self-pay

## 2020-09-06 ENCOUNTER — Telehealth: Payer: Self-pay | Admitting: Family Medicine

## 2020-09-06 DIAGNOSIS — Z6841 Body Mass Index (BMI) 40.0 and over, adult: Secondary | ICD-10-CM

## 2020-09-06 NOTE — Telephone Encounter (Signed)
Pt has gotten a script for Rx #: [id number]  Semaglutide-Weight Management 0.25 MG/0.5ML SOAJ [[id number]], they are on back order. She would now like a new script for Ozempic.   Pharmacy  [facility]  [address], [place] [address]  Phone:  [phone]  Fax:  [phone]  DEA #:  [id number]   Please advise pt at [phone].

## 2020-09-06 NOTE — Telephone Encounter (Signed)
Please advise message below  °

## 2020-09-07 MED ORDER — OZEMPIC (0.25 OR 0.5 MG/DOSE) 2 MG/1.5ML ~~LOC~~ SOPN
0.2500 mg | PEN_INJECTOR | SUBCUTANEOUS | 3 refills | Status: DC
  Filled 2020-09-07: qty 1.5, 56d supply, fill #0
  Filled 2020-10-22: qty 1.5, 56d supply, fill #1

## 2020-09-09 ENCOUNTER — Other Ambulatory Visit (HOSPITAL_COMMUNITY): Payer: Self-pay

## 2020-09-09 NOTE — Telephone Encounter (Signed)
Rx sent in patient aware 

## 2020-09-12 ENCOUNTER — Other Ambulatory Visit (HOSPITAL_COMMUNITY): Payer: Self-pay

## 2020-09-12 ENCOUNTER — Encounter: Payer: Self-pay | Admitting: Family Medicine

## 2020-09-19 ENCOUNTER — Other Ambulatory Visit (HOSPITAL_COMMUNITY): Payer: Self-pay

## 2020-09-19 ENCOUNTER — Ambulatory Visit (INDEPENDENT_AMBULATORY_CARE_PROVIDER_SITE_OTHER): Payer: No Typology Code available for payment source | Admitting: Family Medicine

## 2020-09-19 ENCOUNTER — Other Ambulatory Visit: Payer: Self-pay

## 2020-09-19 ENCOUNTER — Encounter: Payer: Self-pay | Admitting: Family Medicine

## 2020-09-19 VITALS — BP 138/82 | HR 63 | Temp 97.9°F | Ht 67.0 in | Wt 252.2 lb

## 2020-09-19 DIAGNOSIS — Z6841 Body Mass Index (BMI) 40.0 and over, adult: Secondary | ICD-10-CM

## 2020-09-19 DIAGNOSIS — I10 Essential (primary) hypertension: Secondary | ICD-10-CM

## 2020-09-19 DIAGNOSIS — E7800 Pure hypercholesterolemia, unspecified: Secondary | ICD-10-CM

## 2020-09-19 DIAGNOSIS — Z0000 Encounter for general adult medical examination without abnormal findings: Secondary | ICD-10-CM

## 2020-09-19 DIAGNOSIS — F17200 Nicotine dependence, unspecified, uncomplicated: Secondary | ICD-10-CM

## 2020-09-19 DIAGNOSIS — Z716 Tobacco abuse counseling: Secondary | ICD-10-CM

## 2020-09-19 LAB — LIPID PANEL
Cholesterol: 237 mg/dL — ABNORMAL HIGH (ref 0–200)
HDL: 51.6 mg/dL (ref >39.00)
NonHDL: 185.63
Total CHOL/HDL Ratio: 5
Triglycerides: 270 mg/dL — ABNORMAL HIGH (ref 0.0–149.0)
VLDL: 54 mg/dL — ABNORMAL HIGH (ref 0.0–40.0)

## 2020-09-19 LAB — URINALYSIS, ROUTINE W REFLEX MICROSCOPIC
Bilirubin Urine: NEGATIVE
Hgb urine dipstick: NEGATIVE
Ketones, ur: NEGATIVE
Leukocytes,Ua: NEGATIVE
Nitrite: NEGATIVE
RBC / HPF: NONE SEEN (ref 0–?)
Specific Gravity, Urine: <=1.005 — AB (ref 1.000–1.030)
Total Protein, Urine: NEGATIVE
Urine Glucose: NEGATIVE
Urobilinogen, UA: 0.2 (ref 0.0–1.0)
WBC, UA: NONE SEEN (ref 0–?)
pH: 7 (ref 5.0–8.0)

## 2020-09-19 LAB — COMPREHENSIVE METABOLIC PANEL
ALT: 25 U/L (ref 0–35)
AST: 34 U/L (ref 0–37)
Albumin: 4.4 g/dL (ref 3.5–5.2)
Alkaline Phosphatase: 62 U/L (ref 39–117)
BUN: 13 mg/dL (ref 6–23)
CO2: 32 mEq/L (ref 19–32)
Calcium: 9.7 mg/dL (ref 8.4–10.5)
Chloride: 95 mEq/L — ABNORMAL LOW (ref 96–112)
Creatinine, Ser: 0.65 mg/dL (ref 0.40–1.20)
GFR: 102.98 mL/min (ref >60.00)
Glucose, Bld: 85 mg/dL (ref 70–99)
Potassium: 3.4 mEq/L — ABNORMAL LOW (ref 3.5–5.1)
Sodium: 137 mEq/L (ref 135–145)
Total Bilirubin: 1 mg/dL (ref 0.2–1.2)
Total Protein: 7.1 g/dL (ref 6.0–8.3)

## 2020-09-19 LAB — CBC
HCT: 45.6 % (ref 36.0–46.0)
Hemoglobin: 15.6 g/dL — ABNORMAL HIGH (ref 12.0–15.0)
MCHC: 34.1 g/dL (ref 30.0–36.0)
MCV: 94.6 fl (ref 78.0–100.0)
Platelets: 226 10*3/uL (ref 150.0–400.0)
RBC: 4.82 Mil/uL (ref 3.87–5.11)
RDW: 14.6 % (ref 11.5–15.5)
WBC: 5.6 10*3/uL (ref 4.0–10.5)

## 2020-09-19 LAB — LDL CHOLESTEROL, DIRECT: Direct LDL: 152 mg/dL

## 2020-09-19 MED ORDER — VARENICLINE TARTRATE 0.5 MG X 11 & 1 MG X 42 PO MISC
ORAL | 0 refills | Status: DC
  Filled 2020-09-19: qty 53, 28d supply, fill #0

## 2020-09-19 NOTE — Progress Notes (Signed)
Established Patient Office Visit  Subjective:  Patient ID: Angela Stark, female    DOB: 1970/07/25  Age: 50 y.o. MRN: [id number]  CC:  Chief Complaint  Patient presents with   Annual Exam    CPE, no concerns. Patient fasting.     HPI Angela Stark presents for health check and follow-up of hypertension, elevated cholesterol, tobacco use and health maintenance.  Pap smear is up-to-date.  She is scheduling a colonoscopy with [facility].  Doing well with the semaglutide and is definitely curbed her appetite.  She had developed some headache with it but that is resolved.  She is interested in restarting Chantix.  She had taken it before without issue there was no depression or anger when she had taken the medication previously.  Echocardiogram is planned for heart murmur evaluation.  Past Medical History:  Diagnosis Date   Anemia    Anxiety    Back pain    Depression    Fatty liver    GERD (gastroesophageal reflux disease)    Heart murmur    When pregnant   Hyperlipidemia    Hypertension    Joint pain    Knee pain    Muscle stiffness    Phlebitis    Shortness of breath    Shortness of breath on exertion    Sleep apnea    Sleep apnea    Stress    Swelling of both lower extremities    Trouble in sleeping     Past Surgical History:  Procedure Laterality Date   OVARY SURGERY      Family History  Problem Relation Age of Onset   Hypertension Mother    Hyperlipidemia Mother    Diabetes Mother    Emphysema Mother    Obesity Mother    Heart disease Father    Hypertension Father    Hyperlipidemia Father    Obesity Father    Sleep apnea Father    Alcoholism Father    Stroke Brother    Lung cancer Maternal Grandmother     Social History   Socioeconomic History   Marital status: Married    Spouse name: Shanon Brow   Number of children: 3   Years of education: 14   Highest education level: Not on file  Occupational History   Occupation: Cytogeneticist: [employer]  Tobacco Use   Smoking status: Every Day    Packs/day: 0.50    Years: 20.00    Pack years: 10.00    Types: Cigarettes   Smokeless tobacco: Never  Vaping Use   Vaping Use: Never used  Substance and Sexual Activity   Alcohol use: No   Drug use: No   Sexual activity: Yes    Birth control/protection: None  Other Topics Concern   Not on file  Social History Narrative   Fun: Fishing, camping   Denies religious beliefs effecting health care.    Social Determinants of Health   Financial Resource Strain: Not on file  Food Insecurity: Not on file  Transportation Needs: Not on file  Physical Activity: Not on file  Stress: Not on file  Social Connections: Not on file  Intimate Partner Violence: Not on file    Outpatient Medications Prior to Visit  Medication Sig Dispense Refill   amLODipine (NORVASC) 10 MG tablet TAKE 1 TABLET BY MOUTH ONCE DAILY 90 tablet 1   buPROPion (WELLBUTRIN XL) 150 MG 24 hr tablet TAKE 1 TABLET BY MOUTH  DAILY. 90 tablet 1   chlorthalidone (HYGROTON) 25 MG tablet Take 1 tablet (25 mg total) by mouth daily. 100 tablet 1   Ibuprofen-Famotidine 800-26.6 MG TABS Take 1 tablet by mouth 3 (three) times daily. 90 tablet 3   Semaglutide,0.25 or 0.5MG/DOS, (OZEMPIC, 0.25 OR 0.5 MG/DOSE,) 2 MG/1.5ML SOPN Inject 0.25 mg into the skin once a week. 3 mL 3   Carboxymeth-Cellulose-CitricAc (PLENITY WELCOME KIT) CAPS Take 1 capsule by mouth 2 (two) times daily with lunch and supper and a whole glass of water. (Patient not taking: Reported on 09/19/2020) 60 capsule 1   Diclofenac Sodium (PENNSAID) 2 % SOLN Place 1 application onto the skin 2 (two) times daily. (Patient not taking: Reported on 09/19/2020) 1 Bottle 3   varenicline (CHANTIX PAK) 0.5 MG X 11 & 1 MG X 42 tablet Take one 0.5 mg tablet by mouth once daily for 3 days, then increase to one 0.5 mg tablet twice daily for 4 days, then increase to one 1 mg tablet twice daily. (Patient not taking: No sig  reported) 53 tablet 0   No facility-administered medications prior to visit.    No Known Allergies  ROS Review of Systems  Constitutional: Negative.   HENT: Negative.    Eyes:  Negative for photophobia and visual disturbance.  Respiratory: Negative.    Cardiovascular: Negative.   Gastrointestinal: Negative.   Endocrine: Negative for polyphagia and polyuria.  Genitourinary: Negative.   Musculoskeletal:  Negative for gait problem and joint swelling.  Neurological:  Negative for speech difficulty and weakness.     Depression screen [facility] 2/9 09/19/2020 09/19/2019 09/19/2019  Decreased Interest 0 0 0  Down, Depressed, Hopeless 0 0 0  PHQ - 2 Score 0 0 0  Altered sleeping - 1 -  Tired, decreased energy - 0 -  Change in appetite - 0 -  Feeling bad or failure about yourself  - 0 -  Trouble concentrating - 0 -  Moving slowly or fidgety/restless - 0 -  Suicidal thoughts - 0 -  PHQ-9 Score - 1 -  Difficult doing work/chores - Not difficult at all -     Objective:    Physical Exam Vitals and nursing note reviewed.  Constitutional:      General: She is not in acute distress.    Appearance: Normal appearance. She is obese. She is not ill-appearing, toxic-appearing or diaphoretic.  HENT:     Head: Normocephalic and atraumatic.     Right Ear: Tympanic membrane, ear canal and external ear normal.     Left Ear: Tympanic membrane, ear canal and external ear normal.     Mouth/Throat:     Mouth: Mucous membranes are dry.     Pharynx: Oropharynx is clear. No oropharyngeal exudate or posterior oropharyngeal erythema.  Eyes:     General: No scleral icterus.       Right eye: No discharge.        Left eye: No discharge.     Extraocular Movements: Extraocular movements intact.     Conjunctiva/sclera: Conjunctivae normal.     Pupils: Pupils are equal, round, and reactive to light.  Neck:     Vascular: No carotid bruit.  Cardiovascular:     Rate and Rhythm: Normal rate and regular rhythm.      Heart sounds: Murmur heard.  Pulmonary:     Effort: Pulmonary effort is normal.     Breath sounds: Normal breath sounds.  Abdominal:     General: Bowel sounds are  normal.  Musculoskeletal:     Cervical back: No rigidity or tenderness.     Right lower leg: No edema.     Left lower leg: No edema.  Lymphadenopathy:     Cervical: No cervical adenopathy.  Skin:    General: Skin is warm and dry.  Neurological:     Mental Status: She is alert and oriented to person, place, and time.  Psychiatric:        Mood and Affect: Mood normal.        Behavior: Behavior normal.    BP 138/82   Pulse 63   Temp 97.9 F (36.6 C) (Temporal)   Ht 5' 7"  (1.702 m)   Wt 252 lb 3.2 oz (114.4 kg)   SpO2 97%   BMI 39.50 kg/m  Wt Readings from Last 3 Encounters:  09/19/20 252 lb 3.2 oz (114.4 kg)  08/09/20 259 lb 4 oz (117.6 kg)  01/23/20 250 lb (113.4 kg)     Health Maintenance Due  Topic Date Due   Pneumococcal Vaccine 87-15 Years old (1 - PCV) Never done   Hepatitis C Screening  Never done   COLONOSCOPY (Pts 45-42yr Insurance coverage will need to be confirmed)  Never done   COVID-19 Vaccine (3 - Booster for [place]liceseries) 12/21/2019    There are no preventive care reminders to display for this patient.  Lab Results  Component Value Date   TSH 4.24 08/09/2020   Lab Results  Component Value Date   WBC 10.4 09/19/2019   HGB 15.2 (H) 09/19/2019   HCT 44.7 09/19/2019   MCV 97.5 09/19/2019   PLT 214.0 09/19/2019   Lab Results  Component Value Date   NA 134 (L) 09/19/2019   K 3.5 09/19/2019   CO2 28 09/19/2019   GLUCOSE 75 09/19/2019   BUN 12 09/19/2019   CREATININE 0.60 09/19/2019   BILITOT 0.5 09/19/2019   ALKPHOS 53 09/19/2019   AST 35 09/19/2019   ALT 29 09/19/2019   PROT 7.0 09/19/2019   ALBUMIN 4.3 09/19/2019   CALCIUM 9.4 09/19/2019   GFR 106.27 09/19/2019   Lab Results  Component Value Date   CHOL 234 (H) 09/19/2019   Lab Results  Component Value Date   HDL  45.40 09/19/2019   Lab Results  Component Value Date   LDLCALC 155 (H) 09/19/2019   Lab Results  Component Value Date   TRIG 170.0 (H) 09/19/2019   Lab Results  Component Value Date   CHOLHDL 5 09/19/2019   Lab Results  Component Value Date   HGBA1C 5.3 10/18/2017      Assessment & Plan:   Problem List Items Addressed This Visit       Cardiovascular and Mediastinum   Essential hypertension   Relevant Orders   CBC   Comprehensive metabolic panel   Urinalysis, Routine w reflex microscopic     Other   Encounter for smoking cessation counseling   Relevant Medications   varenicline (CHANTIX PAK) 0.5 MG X 11 & 1 MG X 42 tablet   Healthcare maintenance   Tobacco use disorder   Relevant Medications   varenicline (CHANTIX PAK) 0.5 MG X 11 & 1 MG X 42 tablet   Class 3 severe obesity due to excess calories with body mass index (BMI) of 40.0 to 44.9 in adult (HCC) - Primary   Elevated LDL cholesterol level   Relevant Orders   Comprehensive metabolic panel   Lipid panel    Meds ordered this encounter  Medications   varenicline (CHANTIX PAK) 0.5 MG X 11 & 1 MG X 42 tablet    Sig: Take one 0.5 mg tablet by mouth once daily for 3 days, then increase to one 0.5 mg tablet twice daily for 4 days, then increase to one 1 mg tablet twice daily.    Dispense:  53 tablet    Refill:  0    Follow-up: Return in about 6 months (around 03/21/2021), or let me know how chantix is doing for you in a month, please..  Given information on health maintenance and disease prevention.  Encouraged her to continue her weight loss journey and the semaglutide.  Given information on steps to take to quit smoking.   Libby Maw, MD

## 2020-09-20 ENCOUNTER — Other Ambulatory Visit (HOSPITAL_COMMUNITY): Payer: Self-pay

## 2020-10-10 ENCOUNTER — Other Ambulatory Visit (HOSPITAL_COMMUNITY): Payer: Self-pay

## 2020-10-22 ENCOUNTER — Other Ambulatory Visit (HOSPITAL_COMMUNITY): Payer: Self-pay

## 2020-10-22 ENCOUNTER — Encounter: Payer: Self-pay | Admitting: Family Medicine

## 2020-10-22 DIAGNOSIS — Z6841 Body Mass Index (BMI) 40.0 and over, adult: Secondary | ICD-10-CM

## 2020-10-22 MED ORDER — SEMAGLUTIDE(0.25 OR 0.5MG/DOS) 2 MG/1.5ML ~~LOC~~ SOPN
0.5000 mg | PEN_INJECTOR | SUBCUTANEOUS | 1 refills | Status: DC
  Filled 2020-10-22: qty 1.5, 28d supply, fill #0
  Filled 2020-11-17: qty 1.5, 28d supply, fill #1

## 2020-10-22 NOTE — Telephone Encounter (Signed)
Pt wanted to know if it can please be done today because her next dose is on Friday but her or her husband will not be able to pickup for the next 3 days due to work

## 2020-10-22 NOTE — Telephone Encounter (Signed)
Please advise message below  °

## 2020-11-17 ENCOUNTER — Other Ambulatory Visit: Payer: Self-pay | Admitting: Family

## 2020-11-17 DIAGNOSIS — I10 Essential (primary) hypertension: Secondary | ICD-10-CM

## 2020-11-17 DIAGNOSIS — F32A Depression, unspecified: Secondary | ICD-10-CM

## 2020-11-17 DIAGNOSIS — F17200 Nicotine dependence, unspecified, uncomplicated: Secondary | ICD-10-CM

## 2020-11-18 ENCOUNTER — Other Ambulatory Visit (HOSPITAL_COMMUNITY): Payer: Self-pay

## 2020-11-18 MED ORDER — SEMAGLUTIDE (1 MG/DOSE) 4 MG/3ML ~~LOC~~ SOPN
1.0000 mg | PEN_INJECTOR | SUBCUTANEOUS | 0 refills | Status: AC
  Filled 2020-11-18: qty 9, 84d supply, fill #0

## 2020-11-18 NOTE — Addendum Note (Signed)
Addended by: Andrez Grime on: 11/18/2020 02:10 PM   Modules accepted: Orders

## 2020-11-19 ENCOUNTER — Other Ambulatory Visit (HOSPITAL_COMMUNITY): Payer: Self-pay

## 2020-11-20 ENCOUNTER — Other Ambulatory Visit: Payer: Self-pay | Admitting: Family

## 2020-11-20 ENCOUNTER — Other Ambulatory Visit (HOSPITAL_COMMUNITY): Payer: Self-pay

## 2020-11-20 DIAGNOSIS — I10 Essential (primary) hypertension: Secondary | ICD-10-CM

## 2020-11-20 DIAGNOSIS — F17200 Nicotine dependence, unspecified, uncomplicated: Secondary | ICD-10-CM

## 2020-11-20 DIAGNOSIS — Z6841 Body Mass Index (BMI) 40.0 and over, adult: Secondary | ICD-10-CM

## 2020-11-20 DIAGNOSIS — F32A Depression, unspecified: Secondary | ICD-10-CM

## 2020-11-26 ENCOUNTER — Other Ambulatory Visit (HOSPITAL_COMMUNITY): Payer: Self-pay

## 2020-11-27 ENCOUNTER — Other Ambulatory Visit (HOSPITAL_COMMUNITY): Payer: Self-pay

## 2020-11-28 ENCOUNTER — Other Ambulatory Visit (HOSPITAL_COMMUNITY): Payer: Self-pay

## 2020-11-29 ENCOUNTER — Other Ambulatory Visit (HOSPITAL_COMMUNITY): Payer: Self-pay

## 2020-11-30 ENCOUNTER — Other Ambulatory Visit (HOSPITAL_COMMUNITY): Payer: Self-pay

## 2020-12-03 ENCOUNTER — Telehealth: Payer: Self-pay

## 2020-12-03 ENCOUNTER — Encounter: Payer: Self-pay | Admitting: Family Medicine

## 2020-12-03 NOTE — Telephone Encounter (Signed)
Called and spoke to patient. Referral needed for sleep study. Sw, cma

## 2020-12-03 NOTE — Telephone Encounter (Signed)
Called and spoke to patient. Patient confirmed she will check with insurance first to determined next steps. Sw, cma

## 2020-12-05 ENCOUNTER — Other Ambulatory Visit (HOSPITAL_COMMUNITY): Payer: Self-pay

## 2020-12-08 ENCOUNTER — Encounter: Payer: Self-pay | Admitting: Family Medicine

## 2020-12-09 ENCOUNTER — Other Ambulatory Visit: Payer: Self-pay | Admitting: Family Medicine

## 2020-12-09 ENCOUNTER — Other Ambulatory Visit: Payer: Self-pay

## 2020-12-09 ENCOUNTER — Other Ambulatory Visit (HOSPITAL_COMMUNITY): Payer: Self-pay

## 2020-12-09 DIAGNOSIS — F419 Anxiety disorder, unspecified: Secondary | ICD-10-CM

## 2020-12-09 DIAGNOSIS — F32A Depression, unspecified: Secondary | ICD-10-CM

## 2020-12-09 DIAGNOSIS — F17200 Nicotine dependence, unspecified, uncomplicated: Secondary | ICD-10-CM

## 2020-12-09 DIAGNOSIS — I10 Essential (primary) hypertension: Secondary | ICD-10-CM

## 2020-12-09 MED ORDER — AMLODIPINE BESYLATE 10 MG PO TABS
ORAL_TABLET | Freq: Every day | ORAL | 1 refills | Status: DC
  Filled 2020-12-09: qty 90, 90d supply, fill #0
  Filled 2021-04-15: qty 90, 90d supply, fill #1

## 2020-12-09 MED ORDER — BUPROPION HCL ER (XL) 150 MG PO TB24
ORAL_TABLET | Freq: Every day | ORAL | 1 refills | Status: DC
  Filled 2020-12-09: qty 90, 90d supply, fill #0
  Filled 2021-04-15: qty 90, 90d supply, fill #1

## 2020-12-17 ENCOUNTER — Encounter: Payer: Self-pay | Admitting: Family Medicine

## 2020-12-24 ENCOUNTER — Ambulatory Visit: Payer: Self-pay

## 2020-12-24 ENCOUNTER — Ambulatory Visit (INDEPENDENT_AMBULATORY_CARE_PROVIDER_SITE_OTHER): Payer: No Typology Code available for payment source | Admitting: Family Medicine

## 2020-12-24 ENCOUNTER — Encounter: Payer: Self-pay | Admitting: Family Medicine

## 2020-12-24 VITALS — Ht 67.0 in | Wt 239.0 lb

## 2020-12-24 DIAGNOSIS — M170 Bilateral primary osteoarthritis of knee: Secondary | ICD-10-CM | POA: Diagnosis not present

## 2020-12-24 MED ORDER — TRIAMCINOLONE ACETONIDE 40 MG/ML IJ SUSP
40.0000 mg | Freq: Once | INTRAMUSCULAR | Status: AC
  Administered 2020-12-24: 40 mg via INTRA_ARTICULAR

## 2020-12-24 NOTE — Assessment & Plan Note (Signed)
Acute on chronic in nature.  Having an effusion in the left knee but right knee is usually more painful. -Counseled on home exercise therapy and supportive care. -Bilateral injections today. -She will obtain MRI that was previously ordered. -Can consider Zilretta in the future.

## 2020-12-24 NOTE — Progress Notes (Signed)
Angela Stark - 50 y.o. female MRN [id number]  Date of birth: May 24, 1970  SUBJECTIVE:  Including CC & ROS.  No chief complaint on file.   Angela Stark is a 50 y.o. female that is presenting with acute on chronic bilateral knee pain. No injuries in the interim. Pain is over the medial compartment.    Review of Systems See HPI   HISTORY: Past Medical, Surgical, Social, and Family History Reviewed & Updated per EMR.   Pertinent Historical Findings include:  Past Medical History:  Diagnosis Date   Anemia    Anxiety    Back pain    Depression    Fatty liver    GERD (gastroesophageal reflux disease)    Heart murmur    When pregnant   Hyperlipidemia    Hypertension    Joint pain    Knee pain    Muscle stiffness    Phlebitis    Shortness of breath    Shortness of breath on exertion    Sleep apnea    Sleep apnea    Stress    Swelling of both lower extremities    Trouble in sleeping     Past Surgical History:  Procedure Laterality Date   OVARY SURGERY      Family History  Problem Relation Age of Onset   Hypertension Mother    Hyperlipidemia Mother    Diabetes Mother    Emphysema Mother    Obesity Mother    Heart disease Father    Hypertension Father    Hyperlipidemia Father    Obesity Father    Sleep apnea Father    Alcoholism Father    Stroke Brother    Lung cancer Maternal Grandmother     Social History   Socioeconomic History   Marital status: Married    Spouse name: Onalee Hua   Number of children: 3   Years of education: 14   Highest education level: Not on file  Occupational History   Occupation: Scientist, research (medical): [employer]  Tobacco Use   Smoking status: Every Day    Packs/day: 0.50    Years: 20.00    Pack years: 10.00    Types: Cigarettes   Smokeless tobacco: Never  Vaping Use   Vaping Use: Never used  Substance and Sexual Activity   Alcohol use: No   Drug use: No   Sexual activity: Yes    Birth control/protection: None   Other Topics Concern   Not on file  Social History Narrative   Fun: Fishing, camping   Denies religious beliefs effecting health care.    Social Determinants of Health   Financial Resource Strain: Not on file  Food Insecurity: Not on file  Transportation Needs: Not on file  Physical Activity: Not on file  Stress: Not on file  Social Connections: Not on file  Intimate Partner Violence: Not on file     PHYSICAL EXAM:  VS: Ht 5\' 7"  (1.702 m)   Wt 239 lb (108.4 kg)   BMI 37.43 kg/m  Physical Exam Gen: NAD, alert, cooperative with exam, well-appearing    Aspiration/Injection Procedure Note Angela Stark 27-Jun-1970  Procedure: Aspiration and Injection Indications: left knee pain   Procedure Details Consent: Risks of procedure as well as the alternatives and risks of each were explained to the (patient/caregiver).  Consent for procedure obtained. Time Out: Verified patient identification, verified procedure, site/side was marked, verified correct patient position, special equipment/implants available, medications/allergies/relevent history reviewed, required  imaging and test results available.  Performed.  The area was cleaned with iodine and alcohol swabs.    The left knee superiorlateral suprapatellar pouch was injected using 3 cc's of lidocaine on a 1 1/2" gauge needle.  An 18-gauge 1-1/2 inch needle was used to achieve aspiration.  The syringe was switched to mixture containing 1 cc's of 40 mg Kenalog and 4 cc's of 0.25% bupivacaine was injected.  Ultrasound was used. Images were obtained in long views showing the injection.    Amount of Fluid Aspirated:  52mL Character of Fluid: clear and straw colored Fluid was sent for: n/a  A sterile dressing was applied.  Patient did tolerate procedure well.   Aspiration/Injection Procedure Note Angela Stark 16-May-1970  Procedure: Injection Indications: Right knee pain  Procedure Details Consent: Risks of  procedure as well as the alternatives and risks of each were explained to the (patient/caregiver).  Consent for procedure obtained. Time Out: Verified patient identification, verified procedure, site/side was marked, verified correct patient position, special equipment/implants available, medications/allergies/relevent history reviewed, required imaging and test results available.  Performed.  The area was cleaned with iodine and alcohol swabs.    The right knee superior lateral suprapatellar pouch was injected using 3 cc of 1% lidocaine on a 21-gauge 1-1/2 inch needle.  The syringe was switched and a mixture containing  1 cc's of 40 mg Kenalog and 4 cc's of 0.25% bupivacaine was injected.  Ultrasound was used. Images were obtained in long views showing the injection.     A sterile dressing was applied.  Patient did tolerate procedure well.     ASSESSMENT & PLAN:   Primary osteoarthritis of both knees Acute on chronic in nature.  Having an effusion in the left knee but right knee is usually more painful. -Counseled on home exercise therapy and supportive care. -Bilateral injections today. -She will obtain MRI that was previously ordered. -Can consider Zilretta in the future.

## 2020-12-24 NOTE — Patient Instructions (Signed)
Good to see you Please use ice as needed  You can call [phone] to schedule the MRIs. I sent the order to [facility] imaging.   Please send me a message in MyChart with any questions or updates.  Please see me back in 4 weeks or as needed if better.   --Dr. Jordan Likes

## 2021-01-29 ENCOUNTER — Encounter: Payer: Self-pay | Admitting: Family Medicine

## 2021-01-29 DIAGNOSIS — G4733 Obstructive sleep apnea (adult) (pediatric): Secondary | ICD-10-CM

## 2021-02-17 ENCOUNTER — Encounter: Payer: Self-pay | Admitting: Family Medicine

## 2021-02-17 ENCOUNTER — Other Ambulatory Visit (HOSPITAL_COMMUNITY): Payer: Self-pay

## 2021-02-17 DIAGNOSIS — Z6841 Body Mass Index (BMI) 40.0 and over, adult: Secondary | ICD-10-CM

## 2021-02-17 MED ORDER — WEGOVY 1.7 MG/0.75ML ~~LOC~~ SOAJ
1.7000 mg | SUBCUTANEOUS | 5 refills | Status: DC
  Filled 2021-02-17: qty 3, 30d supply, fill #0
  Filled 2021-02-25: qty 3, 28d supply, fill #0
  Filled 2021-03-19: qty 3, 28d supply, fill #1
  Filled 2021-04-15: qty 3, 28d supply, fill #2

## 2021-02-19 ENCOUNTER — Other Ambulatory Visit (HOSPITAL_COMMUNITY): Payer: Self-pay

## 2021-02-21 ENCOUNTER — Other Ambulatory Visit (HOSPITAL_COMMUNITY): Payer: Self-pay

## 2021-02-25 ENCOUNTER — Other Ambulatory Visit (HOSPITAL_COMMUNITY): Payer: Self-pay

## 2021-03-19 ENCOUNTER — Other Ambulatory Visit (HOSPITAL_COMMUNITY): Payer: Self-pay

## 2021-03-20 ENCOUNTER — Encounter: Payer: Self-pay | Admitting: Family Medicine

## 2021-03-20 ENCOUNTER — Other Ambulatory Visit (HOSPITAL_COMMUNITY): Payer: Self-pay

## 2021-03-20 DIAGNOSIS — R0681 Apnea, not elsewhere classified: Secondary | ICD-10-CM

## 2021-03-20 NOTE — Addendum Note (Signed)
Addended by: Andrez Grime on: 03/20/2021 05:16 PM   Modules accepted: Orders

## 2021-04-15 ENCOUNTER — Other Ambulatory Visit (HOSPITAL_COMMUNITY): Payer: Self-pay

## 2021-04-17 ENCOUNTER — Other Ambulatory Visit (HOSPITAL_COMMUNITY): Payer: Self-pay

## 2021-04-29 ENCOUNTER — Ambulatory Visit (INDEPENDENT_AMBULATORY_CARE_PROVIDER_SITE_OTHER): Payer: No Typology Code available for payment source | Admitting: Family Medicine

## 2021-04-29 ENCOUNTER — Encounter: Payer: Self-pay | Admitting: Family Medicine

## 2021-04-29 ENCOUNTER — Ambulatory Visit: Payer: Self-pay

## 2021-04-29 VITALS — BP 150/86 | Ht 67.0 in | Wt 236.0 lb

## 2021-04-29 DIAGNOSIS — M170 Bilateral primary osteoarthritis of knee: Secondary | ICD-10-CM

## 2021-04-29 NOTE — Assessment & Plan Note (Signed)
Acute on chronic in nature.  Pain is been ongoing for several years.  She has completed greater than 6 weeks of home exercise physician directed therapy.  She is also completed physical therapy. -Counseled on home exercise therapy and supportive care  -Zilretta injection today. -MRI of the left and right knee to evaluate for internal derangement and presurgical planning.

## 2021-04-29 NOTE — Patient Instructions (Signed)
Good to see you Please use ice as needed  Please call [phone] to schedule the MRi   Please send me a message in MyChart with any questions or updates.  We'll setup a virtual visit once the MRi is resulted.   --Dr. Jordan Likes

## 2021-04-29 NOTE — Progress Notes (Signed)
ISIDRA MINGS - 51 y.o. female MRN [id number]  Date of birth: Nov 19, 1970  SUBJECTIVE:  Including CC & ROS.  No chief complaint on file.   BLESSED GIRDNER is a 51 y.o. female that is presenting with acute on chronic bilateral knee pain.  The pain is gotten worse over the past few weeks.  Denies any injury inciting event.   Review of Systems See HPI   HISTORY: Past Medical, Surgical, Social, and Family History Reviewed & Updated per EMR.   Pertinent Historical Findings include:  Past Medical History:  Diagnosis Date   Anemia    Anxiety    Back pain    Depression    Fatty liver    GERD (gastroesophageal reflux disease)    Heart murmur    When pregnant   Hyperlipidemia    Hypertension    Joint pain    Knee pain    Muscle stiffness    Phlebitis    Shortness of breath    Shortness of breath on exertion    Sleep apnea    Sleep apnea    Stress    Swelling of both lower extremities    Trouble in sleeping     Past Surgical History:  Procedure Laterality Date   OVARY SURGERY       PHYSICAL EXAM:  VS: BP (!) 150/86 (BP Location: Left Arm, Patient Position: Sitting)    Ht 5\' 7"  (1.702 m)    Wt 236 lb (107 kg)    BMI 36.96 kg/m  Physical Exam Gen: NAD, alert, cooperative with exam, well-appearing MSK:  Neurovascularly intact     Aspiration/Injection Procedure Note KAYLI BEAL 1970-06-24  Procedure: Injection Indications: right knee pain  Procedure Details Consent: Risks of procedure as well as the alternatives and risks of each were explained to the (patient/caregiver).  Consent for procedure obtained. Time Out: Verified patient identification, verified procedure, site/side was marked, verified correct patient position, special equipment/implants available, medications/allergies/relevent history reviewed, required imaging and test results available.  Performed.  The area was cleaned with iodine and alcohol swabs.    The right knee superior  lateral suprapatellar pouch was injected using 3 cc of 1% lidocaine on a 25-gauge 1-1/2 inch needle.   The syringe was switched and a mixture containing 5 cc's of 32 mg Zilretta and 4 cc's of 0.25% bupivacaine was injected.  Ultrasound was used. Images were obtained in long views showing the injection.    A sterile dressing was applied.  Patient did tolerate procedure well.    Aspiration/Injection Procedure Note QUANEISHA HANISCH 08/23/1970  Procedure: Aspiration and Injection Indications: left knee pain   Procedure Details Consent: Risks of procedure as well as the alternatives and risks of each were explained to the (patient/caregiver).  Consent for procedure obtained. Time Out: Verified patient identification, verified procedure, site/side was marked, verified correct patient position, special equipment/implants available, medications/allergies/relevent history reviewed, required imaging and test results available.  Performed.  The area was cleaned with iodine and alcohol swabs.    The left knee superior lateral suprapatellar pouch was injected using 3 cc of 1% lidocaine on a 25-gauge 1-1/2 inch needle.  An 18-gauge 1-1/2 needle was used to achieve aspiration.  The syringe was switched and a mixture containing 5 cc's of 32 mg Zilretta and 4 cc's of 0.25% bupivacaine was injected.  Ultrasound was used. Images were obtained in long views showing the injection.   Amount of Fluid Aspirated: 15mL Character of Fluid: clear and  straw colored Fluid was sent for: n/a  A sterile dressing was applied.  Patient did tolerate procedure well.        ASSESSMENT & PLAN:   Primary osteoarthritis of both knees Acute on chronic in nature.  Pain is been ongoing for several years.  She has completed greater than 6 weeks of home exercise physician directed therapy.  She is also completed physical therapy. -Counseled on home exercise therapy and supportive care  -Zilretta injection today. -MRI  of the left and right knee to evaluate for internal derangement and presurgical planning.

## 2021-05-13 ENCOUNTER — Encounter: Payer: Self-pay | Admitting: Family Medicine

## 2021-05-13 DIAGNOSIS — Z6841 Body Mass Index (BMI) 40.0 and over, adult: Secondary | ICD-10-CM

## 2021-05-13 NOTE — Telephone Encounter (Signed)
Pt says he has increased it for her before.   (She called back)

## 2021-05-14 NOTE — Telephone Encounter (Signed)
Please advise message below patient calling for increase in [facility] states that you have increased in the past.

## 2021-05-14 NOTE — Telephone Encounter (Signed)
Medication increase pending waiting on approval from PCP

## 2021-05-15 ENCOUNTER — Other Ambulatory Visit (HOSPITAL_COMMUNITY): Payer: Self-pay

## 2021-05-15 MED ORDER — WEGOVY 2.4 MG/0.75ML ~~LOC~~ SOAJ
2.4000 mg | SUBCUTANEOUS | 3 refills | Status: DC
  Filled 2021-05-15: qty 3, 28d supply, fill #0
  Filled 2021-06-11: qty 3, 28d supply, fill #1
  Filled 2021-07-18: qty 3, 28d supply, fill #2
  Filled 2021-08-26: qty 3, 28d supply, fill #3

## 2021-05-21 ENCOUNTER — Ambulatory Visit
Admission: RE | Admit: 2021-05-21 | Discharge: 2021-05-21 | Disposition: A | Discharge status: Home / Self Care | Payer: No Typology Code available for payment source | Source: Ambulatory Visit | Attending: Family Medicine | Admitting: Family Medicine

## 2021-05-21 DIAGNOSIS — M170 Bilateral primary osteoarthritis of knee: Secondary | ICD-10-CM

## 2021-05-22 ENCOUNTER — Encounter: Payer: Self-pay | Admitting: Family Medicine

## 2021-05-30 ENCOUNTER — Encounter (HOSPITAL_BASED_OUTPATIENT_CLINIC_OR_DEPARTMENT_OTHER): Payer: Self-pay

## 2021-05-30 DIAGNOSIS — R0683 Snoring: Secondary | ICD-10-CM

## 2021-05-30 DIAGNOSIS — G4730 Sleep apnea, unspecified: Secondary | ICD-10-CM

## 2021-05-30 DIAGNOSIS — G4710 Hypersomnia, unspecified: Secondary | ICD-10-CM

## 2021-06-02 ENCOUNTER — Ambulatory Visit (HOSPITAL_BASED_OUTPATIENT_CLINIC_OR_DEPARTMENT_OTHER): Payer: No Typology Code available for payment source | Attending: Internal Medicine | Admitting: Internal Medicine

## 2021-06-02 DIAGNOSIS — G4733 Obstructive sleep apnea (adult) (pediatric): Secondary | ICD-10-CM | POA: Insufficient documentation

## 2021-06-02 DIAGNOSIS — G4730 Sleep apnea, unspecified: Secondary | ICD-10-CM

## 2021-06-02 DIAGNOSIS — G4710 Hypersomnia, unspecified: Secondary | ICD-10-CM

## 2021-06-02 DIAGNOSIS — R0683 Snoring: Secondary | ICD-10-CM

## 2021-06-03 ENCOUNTER — Telehealth: Payer: No Typology Code available for payment source | Admitting: Family Medicine

## 2021-06-05 ENCOUNTER — Encounter: Payer: Self-pay | Admitting: Family Medicine

## 2021-06-05 ENCOUNTER — Telehealth (INDEPENDENT_AMBULATORY_CARE_PROVIDER_SITE_OTHER): Payer: No Typology Code available for payment source | Admitting: Family Medicine

## 2021-06-05 DIAGNOSIS — M170 Bilateral primary osteoarthritis of knee: Secondary | ICD-10-CM | POA: Diagnosis not present

## 2021-06-05 NOTE — Progress Notes (Signed)
Virtual Visit via Video Note ? ?I connected with Angela Stark on 06/05/21 at  8:00 AM EDT by a video enabled telemedicine application and verified that I am speaking with the correct person using two identifiers. ? ?Location: ?Patient: home ?Provider: office ?  ?I discussed the limitations of evaluation and management by telemedicine and the availability of in person appointments. The patient expressed understanding and agreed to proceed. ? ?History of Present Illness: ? ?Angela Stark is a 51 yo F that is following up after the MRI of her left and right knee.  The left knee was showing moderate degenerative changes in the lateral compartment.  This also shows subchondral marrow changes in the lateral patellofemoral compartments and a bone infarct in the tibial plateau.  The right knee MRI was showing moderate degenerative changes in the medial compartment.  Also showing reactive subcortical marrow changes in the proximal tibia and distal femur. ?  ?Observations/Objective: ? ? ?Assessment and Plan: ? ?Osteoarthritis of bilateral knees: ?MRI was revealing for advanced degenerative changes in the right medial compartment and the left lateral compartment.  Also showing some bony changes with reactive marrow edema.  Having effusion and Baker's cyst as well as degenerative meniscal changes in each knee as well.  She has got significant improvement with the Zilretta injection. ?-Counseled on home exercise therapy and supportive care. ?-Pursue Zilretta injections going forward. ?-Could consider nerve ablation or unloader braces. ? ?Follow Up Instructions: ? ?  ?I discussed the assessment and treatment plan with the patient. The patient was provided an opportunity to ask questions and all were answered. The patient agreed with the plan and demonstrated an understanding of the instructions. ?  ?The patient was advised to call back or seek an in-person evaluation if the symptoms worsen or if the condition fails to  improve as anticipated. ? ? ? ?Clare Gandy, MD ? ? ?

## 2021-06-05 NOTE — Assessment & Plan Note (Signed)
MRI was revealing for advanced degenerative changes in the right medial compartment and the left lateral compartment.  Also showing some bony changes with reactive marrow edema.  Having effusion and Baker's cyst as well as degenerative meniscal changes in each knee as well.  She has got significant improvement with the Zilretta injection. ?-Counseled on home exercise therapy and supportive care. ?-Pursue Zilretta injections going forward. ?-Could consider nerve ablation or unloader braces. ?

## 2021-06-09 NOTE — Procedures (Signed)
? ? ?  NAME: Angela Stark ?DATE OF BIRTH:  Aug 15, 1970 ?MEDICAL RECORD NUMBER [id number]  ?LOCATION: [facility]  ?PHYSICIAN: Deretha Emory  ?DATE OF STUDY: 06/02/2021 ? ?SLEEP STUDY TYPE: Out of Center Sleep Test ?              ? ?REFERRING PHYSICIAN: Carilyn Goodpasture, PA-C ? ?EPWORTH SLEEPINESS SCORE:  4 ?HEIGHT: 5\' 7"  (170.2 cm)  ?WEIGHT: 234 lb (106.1 kg)    Body mass index is 36.65 kg/m?.  ?NECK SIZE: 14 in. ? ?CLINICAL INFORMATION ?The patient was referred to the sleep center for evaluation of obstructive sleep apnea. She has a history of OSA and has lost about 30 pounds since her initial study.  ? ?MEDICATIONS ?No sleep medicine administered per patient.  ? ?SLEEP STUDY TECHNIQUE ?A multi-channel overnight portable sleep study was performed. The channels recorded were: nasal and oral airflow, thoracic and abdominal respiratory movement, and oxygen saturation with a pulse oximetry. Snoring and body position were also monitored. ? ?TECHNICIAN COMMENTS ?Comments added by Technician: N/A ?Comments added by Scorer: N/A ? ?RECORDING SUMMARY ?The study was initiated at 10:23:14 PM and terminated at 7:19:44 AM. The total recorded time was 536.5 minutes. Time in bed was 378.4 minutes. ? ?RESPIRATORY PARAMETERS ?The overall AHI was 20.5 per hour, with a central apnea index of 0 per hour. The patient was supine for 10.6%. The oxygen nadir was 81% during sleep. ? ?CARDIAC DATA ?Mean heart rate during sleep was 62.2 bpm. ? ?IMPRESSIONS ?- Moderate Obstructive Sleep Apnea (OSA) ?- Moderate desaturation ? ?DIAGNOSIS ?- Obstructive Sleep Apnea (G47.33) ? ?RECOMMENDATIONS ?- The patient could be treated with oral appliance of PAP therapy.  ? ? ?Sleep specialist, American Board of Internal Medicine ? ?ELECTRONICALLY SIGNED ON:  06/09/2021, 9:38 AM ?[facility] ?PH: [phone] 06/11/2021   FX: [phone] ?ACCREDITED BY THE AMERICAN ACADEMY OF SLEEP MEDICINE ? ?

## 2021-06-13 ENCOUNTER — Other Ambulatory Visit (HOSPITAL_COMMUNITY): Payer: Self-pay

## 2021-06-17 ENCOUNTER — Other Ambulatory Visit (HOSPITAL_COMMUNITY): Payer: Self-pay

## 2021-06-17 ENCOUNTER — Encounter: Payer: Self-pay | Admitting: Family Medicine

## 2021-06-21 ENCOUNTER — Other Ambulatory Visit (HOSPITAL_COMMUNITY): Payer: Self-pay

## 2021-06-26 ENCOUNTER — Other Ambulatory Visit: Payer: Self-pay | Admitting: Family Medicine

## 2021-06-26 ENCOUNTER — Other Ambulatory Visit (HOSPITAL_COMMUNITY): Payer: Self-pay

## 2021-06-26 DIAGNOSIS — I10 Essential (primary) hypertension: Secondary | ICD-10-CM

## 2021-06-26 MED ORDER — CHLORTHALIDONE 25 MG PO TABS
25.0000 mg | ORAL_TABLET | Freq: Every day | ORAL | 0 refills | Status: DC
  Filled 2021-06-26: qty 90, 90d supply, fill #0
  Filled 2021-08-26: qty 10, 10d supply, fill #1

## 2021-06-26 NOTE — Telephone Encounter (Signed)
Pt called back regarding this.  

## 2021-06-27 ENCOUNTER — Other Ambulatory Visit (HOSPITAL_COMMUNITY): Payer: Self-pay

## 2021-07-03 ENCOUNTER — Other Ambulatory Visit (HOSPITAL_COMMUNITY): Payer: Self-pay

## 2021-07-08 NOTE — Telephone Encounter (Signed)
PA was approved from 07/01/21-07/01/22.  Message sent to patient on my chart.  Dm/cma ? ?

## 2021-07-18 ENCOUNTER — Other Ambulatory Visit (HOSPITAL_COMMUNITY): Payer: Self-pay

## 2021-07-25 ENCOUNTER — Other Ambulatory Visit (HOSPITAL_COMMUNITY): Payer: Self-pay

## 2021-07-28 ENCOUNTER — Other Ambulatory Visit (HOSPITAL_COMMUNITY): Payer: Self-pay

## 2021-08-26 ENCOUNTER — Other Ambulatory Visit: Payer: Self-pay | Admitting: Family Medicine

## 2021-08-26 ENCOUNTER — Other Ambulatory Visit (HOSPITAL_COMMUNITY): Payer: Self-pay

## 2021-08-26 DIAGNOSIS — F419 Anxiety disorder, unspecified: Secondary | ICD-10-CM

## 2021-08-26 DIAGNOSIS — I10 Essential (primary) hypertension: Secondary | ICD-10-CM

## 2021-08-26 DIAGNOSIS — F17200 Nicotine dependence, unspecified, uncomplicated: Secondary | ICD-10-CM

## 2021-08-26 DIAGNOSIS — Z6841 Body Mass Index (BMI) 40.0 and over, adult: Secondary | ICD-10-CM

## 2021-08-26 MED ORDER — BUPROPION HCL ER (XL) 150 MG PO TB24
ORAL_TABLET | Freq: Every day | ORAL | 0 refills | Status: DC
  Filled 2021-08-26: qty 90, 90d supply, fill #0

## 2021-08-26 MED ORDER — AMLODIPINE BESYLATE 10 MG PO TABS
ORAL_TABLET | Freq: Every day | ORAL | 0 refills | Status: DC
  Filled 2021-08-26: qty 90, 90d supply, fill #0

## 2021-08-26 MED ORDER — CHLORTHALIDONE 25 MG PO TABS
25.0000 mg | ORAL_TABLET | Freq: Every day | ORAL | 0 refills | Status: DC
  Filled 2021-08-26: qty 100, 100d supply, fill #0
  Filled 2021-09-23: qty 90, 90d supply, fill #0

## 2021-09-02 ENCOUNTER — Telehealth: Payer: Self-pay | Admitting: Family Medicine

## 2021-09-02 NOTE — Telephone Encounter (Signed)
Call pt back when Zilretta update from [facility] rcvd

## 2021-09-03 NOTE — Telephone Encounter (Signed)
Rec'd fax from [employer]- they are unable to obtain benefits and OOP cost for pt's bilateral Zilretta injections. We need to contact the patient's plan directly at [phone].

## 2021-09-05 NOTE — Telephone Encounter (Signed)
Pt informed of below.  I called [employer] @ [phone]. Patient owes a $50 copay for bilateral knee Zilretta injections.   Her plan requires PA for Zilretta. Clinicals faxed to [phone] Attn CM coordinator. Will wait for PA decision and call her back to schedule.

## 2021-09-17 ENCOUNTER — Ambulatory Visit: Payer: No Typology Code available for payment source | Admitting: Family Medicine

## 2021-09-17 ENCOUNTER — Ambulatory Visit: Payer: Self-pay

## 2021-09-17 VITALS — BP 130/88 | Ht 67.0 in | Wt 239.0 lb

## 2021-09-17 DIAGNOSIS — M170 Bilateral primary osteoarthritis of knee: Secondary | ICD-10-CM | POA: Diagnosis not present

## 2021-09-17 MED ORDER — TRIAMCINOLONE ACETONIDE 32 MG IX SRER
32.0000 mg | Freq: Once | INTRA_ARTICULAR | Status: AC
  Administered 2021-09-17: 32 mg via INTRA_ARTICULAR

## 2021-09-17 NOTE — Assessment & Plan Note (Signed)
Acute on chronic in nature.  Completed bilateral Zilretta injections today.

## 2021-09-17 NOTE — Patient Instructions (Signed)
Good to see you ?Please use ice as needed  ?Please send me a message in MyChart with any questions or updates.  ?Please see me back in 3 months.  ? ?--Dr. Frederico Gerling ? ?

## 2021-09-17 NOTE — Progress Notes (Signed)
  Angela Stark - 51 y.o. female MRN [id number]  Date of birth: Aug 05, 1970  SUBJECTIVE:  Including CC & ROS.  No chief complaint on file.   Angela Stark is a 50 y.o. female that is here for Zilretta injections.    Review of Systems See HPI   HISTORY: Past Medical, Surgical, Social, and Family History Reviewed & Updated per EMR.   Pertinent Historical Findings include:  Past Medical History:  Diagnosis Date   Anemia    Anxiety    Back pain    Depression    Fatty liver    GERD (gastroesophageal reflux disease)    Heart murmur    When pregnant   Hyperlipidemia    Hypertension    Joint pain    Knee pain    Muscle stiffness    Phlebitis    Shortness of breath    Shortness of breath on exertion    Sleep apnea    Sleep apnea    Stress    Swelling of both lower extremities    Trouble in sleeping     Past Surgical History:  Procedure Laterality Date   OVARY SURGERY       PHYSICAL EXAM:  VS: BP 130/88 (BP Location: Right Arm, Patient Position: Sitting, Cuff Size: Normal)   Ht 5\' 7"  (1.702 m)   Wt 239 lb (108.4 kg)   BMI 37.43 kg/m  Physical Exam Gen: NAD, alert, cooperative with exam, well-appearing MSK:  Neurovascularly intact     Aspiration/Injection Procedure Note Angela Stark March 03, 1971  Procedure: Injection Indications: left knee pain  Procedure Details Consent: Risks of procedure as well as the alternatives and risks of each were explained to the (patient/caregiver).  Consent for procedure obtained. Time Out: Verified patient identification, verified procedure, site/side was marked, verified correct patient position, special equipment/implants available, medications/allergies/relevent history reviewed, required imaging and test results available.  Performed.  The area was cleaned with iodine and alcohol swabs.    The left knee superior lateral suprapatellar pouch was injected using 3 cc of 1% lidocaine on a 22-gauge 1-1/2 inch  needle.   The syringe was switched and a mixture containing 5 cc's of 32 mg Zilretta and 4 cc's of 0.25% bupivacaine was injected.  Ultrasound was used. Images were obtained in long views showing the injection.    A sterile dressing was applied.  Patient did tolerate procedure well.  Aspiration/Injection Procedure Note Angela Stark 05-24-1970  Procedure: Injection Indications: right knee pain  Procedure Details Consent: Risks of procedure as well as the alternatives and risks of each were explained to the (patient/caregiver).  Consent for procedure obtained. Time Out: Verified patient identification, verified procedure, site/side was marked, verified correct patient position, special equipment/implants available, medications/allergies/relevent history reviewed, required imaging and test results available.  Performed.  The area was cleaned with iodine and alcohol swabs.    The right knee superior lateral suprapatellar pouch was injected using 3 cc of 1% lidocaine on a 22-gauge 1-1/2 inch needle.   The syringe was switched and a mixture containing 5 cc's of 32 mg Zilretta and 4 cc's of 0.25% bupivacaine was injected.  Ultrasound was used. Images were obtained in long views showing the injection.    A sterile dressing was applied.  Patient did tolerate procedure well.      ASSESSMENT & PLAN:   Primary osteoarthritis of both knees Acute on chronic in nature.  Completed bilateral Zilretta injections today.

## 2021-09-23 ENCOUNTER — Other Ambulatory Visit: Payer: Self-pay | Admitting: Family Medicine

## 2021-09-24 ENCOUNTER — Other Ambulatory Visit (HOSPITAL_COMMUNITY): Payer: Self-pay

## 2021-09-24 MED ORDER — WEGOVY 2.4 MG/0.75ML ~~LOC~~ SOAJ
2.4000 mg | SUBCUTANEOUS | 1 refills | Status: DC
  Filled 2021-09-24: qty 3, 28d supply, fill #0
  Filled 2021-10-17: qty 3, 28d supply, fill #1

## 2021-09-24 NOTE — Telephone Encounter (Signed)
Called & LVM, adv pt to call  back to schedule OV w/ Dr. Doreene Burke.

## 2021-09-24 NOTE — Telephone Encounter (Signed)
Weight management appt scheduled

## 2021-09-29 ENCOUNTER — Encounter: Payer: Self-pay | Admitting: Family Medicine

## 2021-09-30 ENCOUNTER — Telehealth: Payer: Self-pay | Admitting: Family Medicine

## 2021-09-30 ENCOUNTER — Ambulatory Visit: Payer: No Typology Code available for payment source | Admitting: Family Medicine

## 2021-09-30 NOTE — Telephone Encounter (Signed)
I have to cancel in am. Does Dr Doreene Burke have appton friday?     Angela Stark  P [facility] (supporting Mliss Sax, MD) 14 hours ago (7:49 PM) She cancelled yesterday, last night.  [name] did not mail the letter off.

## 2021-09-30 NOTE — Telephone Encounter (Signed)
Pt was a no show 09/30/2021 for an OV with Dr. Doreene Burke, I have sent out a no show letter

## 2021-10-17 ENCOUNTER — Other Ambulatory Visit (HOSPITAL_COMMUNITY): Payer: Self-pay

## 2021-10-24 NOTE — Telephone Encounter (Signed)
Letter mailed

## 2021-10-24 NOTE — Telephone Encounter (Signed)
1st no show, fee waived, go ahead and mail no show letter please

## 2021-11-05 ENCOUNTER — Other Ambulatory Visit (HOSPITAL_COMMUNITY): Payer: Self-pay

## 2021-11-05 ENCOUNTER — Other Ambulatory Visit: Payer: Self-pay | Admitting: Family Medicine

## 2021-11-06 ENCOUNTER — Other Ambulatory Visit: Payer: Self-pay | Admitting: Family Medicine

## 2021-11-07 ENCOUNTER — Other Ambulatory Visit: Payer: Self-pay | Admitting: Family Medicine

## 2021-11-10 ENCOUNTER — Ambulatory Visit (INDEPENDENT_AMBULATORY_CARE_PROVIDER_SITE_OTHER): Payer: No Typology Code available for payment source | Admitting: Family Medicine

## 2021-11-10 ENCOUNTER — Encounter: Payer: Self-pay | Admitting: Family Medicine

## 2021-11-10 ENCOUNTER — Other Ambulatory Visit (HOSPITAL_COMMUNITY): Payer: Self-pay

## 2021-11-10 VITALS — BP 130/76 | HR 74 | Temp 98.0°F | Ht 67.0 in | Wt 237.6 lb

## 2021-11-10 DIAGNOSIS — Z0000 Encounter for general adult medical examination without abnormal findings: Secondary | ICD-10-CM | POA: Diagnosis not present

## 2021-11-10 DIAGNOSIS — I10 Essential (primary) hypertension: Secondary | ICD-10-CM

## 2021-11-10 DIAGNOSIS — F32A Depression, unspecified: Secondary | ICD-10-CM

## 2021-11-10 DIAGNOSIS — Z6841 Body Mass Index (BMI) 40.0 and over, adult: Secondary | ICD-10-CM

## 2021-11-10 DIAGNOSIS — Z23 Encounter for immunization: Secondary | ICD-10-CM

## 2021-11-10 DIAGNOSIS — F17200 Nicotine dependence, unspecified, uncomplicated: Secondary | ICD-10-CM

## 2021-11-10 DIAGNOSIS — E66813 Obesity, class 3: Secondary | ICD-10-CM

## 2021-11-10 DIAGNOSIS — E7800 Pure hypercholesterolemia, unspecified: Secondary | ICD-10-CM

## 2021-11-10 DIAGNOSIS — F419 Anxiety disorder, unspecified: Secondary | ICD-10-CM

## 2021-11-10 HISTORY — DX: Encounter for immunization: Z23

## 2021-11-10 MED ORDER — WEGOVY 2.4 MG/0.75ML ~~LOC~~ SOAJ
2.4000 mg | SUBCUTANEOUS | 3 refills | Status: DC
  Filled 2021-11-10: qty 3, 28d supply, fill #0
  Filled 2021-12-05: qty 3, 28d supply, fill #1
  Filled 2022-01-02: qty 3, 28d supply, fill #2
  Filled 2022-01-02 – 2022-02-04 (×2): qty 3, 28d supply, fill #3
  Filled 2022-03-03 – 2022-03-04 (×2): qty 3, 28d supply, fill #4
  Filled 2022-04-01: qty 3, 28d supply, fill #5
  Filled 2022-05-08: qty 3, 28d supply, fill #6
  Filled 2022-07-07 – 2022-08-03 (×2): qty 3, 28d supply, fill #7

## 2021-11-10 NOTE — Progress Notes (Signed)
Established Patient Office Visit  Subjective   Patient ID: Angela Stark, female    DOB: 1970-11-18  Age: 51 y.o. MRN: [id number]  Chief Complaint  Patient presents with   Follow-up    F/u for wegovy no concerns.    HPI for yearly physical and follow-up of hypertension, elevated cholesterol, obesity, tobacco use and anxiety with depression.  Continues Wellbutrin with good effect.  Blood pressure controlled with amlodipine and chlorthalidone.  Continues to lower fat and cholesterol in the diet.  Continues with UDJSHF for weight loss.  Certainly is helping appetite control.  May be near trying to quit tobacco.  She had tried Chantix but discontinued it because it was not the right time to quit.  She had no issue taking the medication.  She is having regular dental care.  Preparing to start nursing school.    Review of Systems  Constitutional:  Negative for chills, diaphoresis, malaise/fatigue and weight loss.  HENT: Negative.    Eyes: Negative.  Negative for blurred vision and double vision.  Cardiovascular:  Negative for chest pain.  Gastrointestinal:  Negative for abdominal pain.  Genitourinary: Negative.   Musculoskeletal:  Negative for falls and myalgias.  Neurological:  Negative for speech change, loss of consciousness and weakness.  Psychiatric/Behavioral: Negative.        11/10/2021    3:41 PM 09/19/2020   12:27 PM 09/19/2020   10:40 AM  Depression screen PHQ 2/9  Decreased Interest 0 1 0  Down, Depressed, Hopeless 0 1 0  PHQ - 2 Score 0 2 0  Altered sleeping  0   Tired, decreased energy  0   Change in appetite  0   Feeling bad or failure about yourself   0   Trouble concentrating  0   Moving slowly or fidgety/restless  0   Suicidal thoughts  0   PHQ-9 Score  2   Difficult doing work/chores  Not difficult at all        Objective:     BP 130/76 (BP Location: Left Arm, Patient Position: Sitting, Cuff Size: Normal)   Pulse 74   Temp 98 F (36.7 C)   Ht  5\' 7"  (1.702 m)   Wt 237 lb 9.6 oz (107.8 kg)   SpO2 95%   BMI 37.21 kg/m  BP Readings from Last 3 Encounters:  11/10/21 130/76  09/17/21 130/88  04/29/21 (!) 150/86   Wt Readings from Last 3 Encounters:  11/10/21 237 lb 9.6 oz (107.8 kg)  09/17/21 239 lb (108.4 kg)  06/05/21 234 lb (106.1 kg)      Physical Exam Constitutional:      General: She is not in acute distress.    Appearance: Normal appearance. She is not ill-appearing, toxic-appearing or diaphoretic.  HENT:     Head: Normocephalic and atraumatic.     Right Ear: External ear normal.     Left Ear: External ear normal.     Mouth/Throat:     Mouth: Mucous membranes are moist.     Pharynx: Oropharynx is clear. No oropharyngeal exudate or posterior oropharyngeal erythema.  Eyes:     General: No scleral icterus.       Right eye: No discharge.        Left eye: No discharge.     Extraocular Movements: Extraocular movements intact.     Conjunctiva/sclera: Conjunctivae normal.     Pupils: Pupils are equal, round, and reactive to light.  Cardiovascular:     Rate and  Rhythm: Normal rate and regular rhythm.  Pulmonary:     Effort: Pulmonary effort is normal. No respiratory distress.     Breath sounds: Normal breath sounds.  Musculoskeletal:     Cervical back: No rigidity or tenderness.  Skin:    General: Skin is warm and dry.  Neurological:     Mental Status: She is alert and oriented to person, place, and time.  Psychiatric:        Mood and Affect: Mood normal.        Behavior: Behavior normal.      No results found for any visits on 11/10/21.    The 10-year ASCVD risk score (Arnett DK, et al., 2019) is: 6.9%    Assessment & Plan:   Problem List Items Addressed This Visit       Cardiovascular and Mediastinum   Essential hypertension   Relevant Orders   CBC   Comprehensive metabolic panel   Urinalysis, Routine w reflex microscopic     Other   Anxiety and depression   Healthcare maintenance -  Primary   Relevant Orders   Ambulatory referral to Gastroenterology   Tobacco use disorder   Class 3 severe obesity due to excess calories with body mass index (BMI) of 40.0 to 44.9 in adult [facility])   Relevant Medications   Semaglutide-Weight Management (WEGOVY) 2.4 MG/0.75ML SOAJ   Elevated LDL cholesterol level   Relevant Orders   Comprehensive metabolic panel   Lipid panel   Need for shingles vaccine   Relevant Orders   Varicella-zoster vaccine IM    Return in about 6 months (around 05/13/2022), or Return fasting for blood work..  Continue amlodipine and chlorthalidone for hypertension.  Continue bupropion.  Receive her first Shingrix vaccine today.  Continue [id number].  It is helping.  Did discuss starting a statin.  She had tried Chantix in the past for smoking cessation.  There was no problems taking the drug she just stopped it decided it was not a good time to try to quit.  She will pick a quit date let me know.  Continue weight loss journey.  She will make an appointment with her GYN doc for follow-up.  Mliss Sax, MD

## 2021-11-11 ENCOUNTER — Other Ambulatory Visit (HOSPITAL_COMMUNITY): Payer: Self-pay

## 2021-11-12 ENCOUNTER — Other Ambulatory Visit (HOSPITAL_COMMUNITY): Payer: Self-pay

## 2021-11-18 ENCOUNTER — Other Ambulatory Visit (HOSPITAL_COMMUNITY): Payer: Self-pay

## 2021-11-18 ENCOUNTER — Other Ambulatory Visit: Payer: Self-pay | Admitting: Family Medicine

## 2021-11-18 ENCOUNTER — Other Ambulatory Visit (INDEPENDENT_AMBULATORY_CARE_PROVIDER_SITE_OTHER): Payer: No Typology Code available for payment source

## 2021-11-18 ENCOUNTER — Encounter: Payer: Self-pay | Admitting: Family Medicine

## 2021-11-18 DIAGNOSIS — F419 Anxiety disorder, unspecified: Secondary | ICD-10-CM

## 2021-11-18 DIAGNOSIS — I10 Essential (primary) hypertension: Secondary | ICD-10-CM

## 2021-11-18 DIAGNOSIS — E7800 Pure hypercholesterolemia, unspecified: Secondary | ICD-10-CM

## 2021-11-18 DIAGNOSIS — E876 Hypokalemia: Secondary | ICD-10-CM

## 2021-11-18 DIAGNOSIS — F17200 Nicotine dependence, unspecified, uncomplicated: Secondary | ICD-10-CM

## 2021-11-18 DIAGNOSIS — Z1231 Encounter for screening mammogram for malignant neoplasm of breast: Secondary | ICD-10-CM

## 2021-11-18 LAB — CBC
HCT: 47.4 % — ABNORMAL HIGH (ref 36.0–46.0)
Hemoglobin: 16.1 g/dL — ABNORMAL HIGH (ref 12.0–15.0)
MCHC: 33.9 g/dL (ref 30.0–36.0)
MCV: 96.7 fl (ref 78.0–100.0)
Platelets: 239 10*3/uL (ref 150.0–400.0)
RBC: 4.9 Mil/uL (ref 3.87–5.11)
RDW: 13.7 % (ref 11.5–15.5)
WBC: 5.3 10*3/uL (ref 4.0–10.5)

## 2021-11-18 LAB — LIPID PANEL
Cholesterol: 222 mg/dL — ABNORMAL HIGH (ref 0–200)
HDL: 41.2 mg/dL (ref >39.00)
NonHDL: 181.15
Total CHOL/HDL Ratio: 5
Triglycerides: 230 mg/dL — ABNORMAL HIGH (ref 0.0–149.0)
VLDL: 46 mg/dL — ABNORMAL HIGH (ref 0.0–40.0)

## 2021-11-18 LAB — URINALYSIS, ROUTINE W REFLEX MICROSCOPIC
Bilirubin Urine: NEGATIVE
Hgb urine dipstick: NEGATIVE
Ketones, ur: NEGATIVE
Leukocytes,Ua: NEGATIVE
Nitrite: NEGATIVE
RBC / HPF: NONE SEEN (ref 0–?)
Specific Gravity, Urine: 1.01 (ref 1.000–1.030)
Total Protein, Urine: NEGATIVE
Urine Glucose: NEGATIVE
Urobilinogen, UA: 0.2 (ref 0.0–1.0)
pH: 7 (ref 5.0–8.0)

## 2021-11-18 LAB — COMPREHENSIVE METABOLIC PANEL
ALT: 24 U/L (ref 0–35)
AST: 28 U/L (ref 0–37)
Albumin: 4 g/dL (ref 3.5–5.2)
Alkaline Phosphatase: 56 U/L (ref 39–117)
BUN: 8 mg/dL (ref 6–23)
CO2: 29 mEq/L (ref 19–32)
Calcium: 9.4 mg/dL (ref 8.4–10.5)
Chloride: 96 mEq/L (ref 96–112)
Creatinine, Ser: 0.59 mg/dL (ref 0.40–1.20)
GFR: 104.56 mL/min (ref >60.00)
Glucose, Bld: 78 mg/dL (ref 70–99)
Potassium: 3.4 mEq/L — ABNORMAL LOW (ref 3.5–5.1)
Sodium: 137 mEq/L (ref 135–145)
Total Bilirubin: 0.8 mg/dL (ref 0.2–1.2)
Total Protein: 7 g/dL (ref 6.0–8.3)

## 2021-11-18 LAB — LDL CHOLESTEROL, DIRECT: Direct LDL: 147 mg/dL

## 2021-11-18 MED ORDER — BUPROPION HCL ER (XL) 150 MG PO TB24
ORAL_TABLET | Freq: Every day | ORAL | 3 refills | Status: DC
  Filled 2021-11-18: qty 90, 90d supply, fill #0
  Filled 2022-03-03 – 2022-04-01 (×3): qty 90, 90d supply, fill #1
  Filled 2022-08-03: qty 90, 90d supply, fill #2

## 2021-11-18 MED ORDER — AMLODIPINE BESYLATE 10 MG PO TABS
ORAL_TABLET | Freq: Every day | ORAL | 0 refills | Status: DC
  Filled 2021-11-18: qty 90, 90d supply, fill #0

## 2021-11-19 ENCOUNTER — Other Ambulatory Visit (HOSPITAL_COMMUNITY): Payer: Self-pay

## 2021-11-19 MED ORDER — ATORVASTATIN CALCIUM 20 MG PO TABS
20.0000 mg | ORAL_TABLET | Freq: Every day | ORAL | 3 refills | Status: DC
  Filled 2021-11-19: qty 90, 90d supply, fill #0
  Filled 2022-03-03 – 2022-04-01 (×3): qty 90, 90d supply, fill #1
  Filled 2022-08-03: qty 90, 90d supply, fill #2

## 2021-11-19 MED ORDER — POTASSIUM CHLORIDE CRYS ER 20 MEQ PO TBCR
20.0000 meq | EXTENDED_RELEASE_TABLET | Freq: Every day | ORAL | 1 refills | Status: DC
  Filled 2021-11-19: qty 90, 90d supply, fill #0
  Filled 2022-03-03 – 2022-04-01 (×3): qty 90, 90d supply, fill #1

## 2021-11-21 ENCOUNTER — Ambulatory Visit
Admission: RE | Admit: 2021-11-21 | Discharge: 2021-11-21 | Disposition: A | Discharge status: Home / Self Care | Payer: No Typology Code available for payment source | Source: Ambulatory Visit | Attending: Family Medicine | Admitting: Family Medicine

## 2021-11-21 DIAGNOSIS — Z1231 Encounter for screening mammogram for malignant neoplasm of breast: Secondary | ICD-10-CM

## 2021-12-05 ENCOUNTER — Other Ambulatory Visit (HOSPITAL_COMMUNITY): Payer: Self-pay

## 2021-12-25 ENCOUNTER — Telehealth: Payer: Self-pay

## 2021-12-25 NOTE — Telephone Encounter (Signed)
We need to contact the patient's plan directly at [phone] for Zilretta OOP benefits and to intiate PA. Patient wants bilateral knee Zilretta injections.

## 2021-12-25 NOTE — Telephone Encounter (Signed)
Patient is scheduled for zilretta injection next Tuesday. Patient had her last one in June and got a bill for 500+ and when she called her insurance she was told that she received 64mg  not 32mg .  since that was over the 1500 dollars that was approved for is why she got the extra bill. That bill ended up being waved but patient would like to make sure this can be clarified before she comes in.   Patient states the bill showed the 64mg  was 2000+ and then the code for the actual injection we use was about 400+ so around 3000 was the total amount. She was told that it looked like she got 64mg  per knee not 32mg ? Is what she thinks they were trying to tell her.   Patient is also wanting to make sure that the zilretta is in stock.  If needing any more clarification or needing to reschedule for more time patient is more than welcome to accommodate.

## 2021-12-26 NOTE — Telephone Encounter (Signed)
I faxed 09/17/21 OV note to [employer] regarding patient's last Zilretta injections.    We still need to contact her plan directly for her upcoming bilateral Zilretta injections  @ [phone].

## 2021-12-30 ENCOUNTER — Ambulatory Visit: Payer: Self-pay

## 2021-12-30 ENCOUNTER — Ambulatory Visit (INDEPENDENT_AMBULATORY_CARE_PROVIDER_SITE_OTHER): Payer: No Typology Code available for payment source | Admitting: Family Medicine

## 2021-12-30 ENCOUNTER — Encounter: Payer: Self-pay | Admitting: Family Medicine

## 2021-12-30 VITALS — BP 130/80 | Ht 67.0 in | Wt 237.0 lb

## 2021-12-30 DIAGNOSIS — M170 Bilateral primary osteoarthritis of knee: Secondary | ICD-10-CM | POA: Diagnosis not present

## 2021-12-30 MED ORDER — TRIAMCINOLONE ACETONIDE 32 MG IX SRER
32.0000 mg | Freq: Once | INTRA_ARTICULAR | Status: AC
  Administered 2021-12-30: 32 mg via INTRA_ARTICULAR

## 2021-12-30 NOTE — Patient Instructions (Signed)
Good to see you ?Please use ice as needed  ?Please send me a message in MyChart with any questions or updates.  ?Please see me back in 3 months.  ? ?--Dr. Twana Wileman ? ?

## 2021-12-30 NOTE — Assessment & Plan Note (Addendum)
Completed bilateral zilretta injections.  - will pursue gel injections if pain returns.

## 2021-12-30 NOTE — Progress Notes (Addendum)
RAYMONA BOSS - 51 y.o. female MRN [id number]  Date of birth: [birth year]  SUBJECTIVE:  Including CC & ROS.  No chief complaint on file.   TALECIA SHERLIN is a 51 y.o. female that is  here for bilateral zilretta injections.    Review of Systems See HPI   HISTORY: Past Medical, Surgical, Social, and Family History Reviewed & Updated per EMR.   Pertinent Historical Findings include:  Past Medical History:  Diagnosis Date   Anemia    Anxiety    Back pain    Depression    Fatty liver    GERD (gastroesophageal reflux disease)    Heart murmur    When pregnant   Hyperlipidemia    Hypertension    Joint pain    Knee pain    Muscle stiffness    Phlebitis    Shortness of breath    Shortness of breath on exertion    Sleep apnea    Sleep apnea    Stress    Swelling of both lower extremities    Trouble in sleeping     Past Surgical History:  Procedure Laterality Date   OVARY SURGERY       PHYSICAL EXAM:  VS: BP 130/80 (BP Location: Left Arm, Patient Position: Sitting)   Ht 5\' 7"  (1.702 m)   Wt 237 lb (107.5 kg)   BMI 37.12 kg/m  Physical Exam Gen: NAD, alert, cooperative with exam, well-appearing MSK:  Neurovascularly intact     Aspiration/Injection Procedure Note ASHANI PUMPHREY 04/23/1970  Procedure: Aspiration and Injection Indications: right  knee pain  Procedure Details Consent: Risks of procedure as well as the alternatives and risks of each were explained to the (patient/caregiver).  Consent for procedure obtained. Time Out: Verified patient identification, verified procedure, site/side was marked, verified correct patient position, special equipment/implants available, medications/allergies/relevent history reviewed, required imaging and test results available.  Performed.  The area was cleaned with iodine and alcohol swabs.    The right  knee superior lateral suprapatellar pouch was injected using 3 cc of 1% lidocaine on a 25-gauge 1-1/2  inch needle.  An 18-gauge 1-1/2 needle was used to achieve aspiration.  The syringe was switched and a mixture containing 5 cc's of 32 mg Zilretta and 4 cc's of 0.25% bupivacaine was injected.  Ultrasound was used. Images were obtained in long views showing the injection.   Amount of Fluid Aspirated:  12mL Character of Fluid: clear and straw colored Fluid was sent for: n/a  A sterile dressing was applied.  Patient did tolerate procedure well.   Aspiration/Injection Procedure Note NICHELE SLAWSON 04-May-1970  Procedure: Injection Indications: left knee pain  Procedure Details Consent: Risks of procedure as well as the alternatives and risks of each were explained to the (patient/caregiver).  Consent for procedure obtained. Time Out: Verified patient identification, verified procedure, site/side was marked, verified correct patient position, special equipment/implants available, medications/allergies/relevent history reviewed, required imaging and test results available.  Performed.  The area was cleaned with iodine and alcohol swabs.    The left knee superior lateral suprapatellar pouch was injected using 3 cc of 1% lidocaine on a 25-gauge 1-1/2 inch needle.   The syringe was switched and a mixture containing 5 cc's of 32 mg Zilretta and 4 cc's of 0.25% bupivacaine was injected.  Ultrasound was used. Images were obtained in long views showing the injection.    A sterile dressing was applied.  Patient did tolerate procedure well.  ASSESSMENT & PLAN:   Primary osteoarthritis of both knees Completed bilateral zilretta injections.  - will pursue gel injections if pain returns.

## 2022-01-02 ENCOUNTER — Other Ambulatory Visit: Payer: Self-pay | Admitting: Family Medicine

## 2022-01-02 ENCOUNTER — Other Ambulatory Visit (HOSPITAL_COMMUNITY): Payer: Self-pay

## 2022-01-02 DIAGNOSIS — I10 Essential (primary) hypertension: Secondary | ICD-10-CM

## 2022-01-02 MED ORDER — CHLORTHALIDONE 25 MG PO TABS
25.0000 mg | ORAL_TABLET | Freq: Every day | ORAL | 0 refills | Status: DC
  Filled 2022-01-02: qty 90, 90d supply, fill #0

## 2022-02-02 ENCOUNTER — Other Ambulatory Visit (HOSPITAL_COMMUNITY): Payer: Self-pay

## 2022-02-04 ENCOUNTER — Other Ambulatory Visit (HOSPITAL_COMMUNITY): Payer: Self-pay

## 2022-02-19 ENCOUNTER — Ambulatory Visit (INDEPENDENT_AMBULATORY_CARE_PROVIDER_SITE_OTHER): Payer: No Typology Code available for payment source | Admitting: Family Medicine

## 2022-02-19 ENCOUNTER — Encounter: Payer: Self-pay | Admitting: Family Medicine

## 2022-02-19 ENCOUNTER — Other Ambulatory Visit (HOSPITAL_COMMUNITY): Payer: Self-pay

## 2022-02-19 VITALS — BP 124/72 | HR 78 | Temp 96.0°F | Ht 67.0 in | Wt 238.2 lb

## 2022-02-19 DIAGNOSIS — Z23 Encounter for immunization: Secondary | ICD-10-CM

## 2022-02-19 DIAGNOSIS — B029 Zoster without complications: Secondary | ICD-10-CM | POA: Diagnosis not present

## 2022-02-19 DIAGNOSIS — F4321 Adjustment disorder with depressed mood: Secondary | ICD-10-CM

## 2022-02-19 DIAGNOSIS — Z6841 Body Mass Index (BMI) 40.0 and over, adult: Secondary | ICD-10-CM

## 2022-02-19 DIAGNOSIS — E876 Hypokalemia: Secondary | ICD-10-CM | POA: Diagnosis not present

## 2022-02-19 DIAGNOSIS — I10 Essential (primary) hypertension: Secondary | ICD-10-CM | POA: Diagnosis not present

## 2022-02-19 DIAGNOSIS — H60501 Unspecified acute noninfective otitis externa, right ear: Secondary | ICD-10-CM

## 2022-02-19 LAB — BASIC METABOLIC PANEL
BUN: 16 mg/dL (ref 6–23)
CO2: 35 mEq/L — ABNORMAL HIGH (ref 19–32)
Calcium: 9.5 mg/dL (ref 8.4–10.5)
Chloride: 97 mEq/L (ref 96–112)
Creatinine, Ser: 0.56 mg/dL (ref 0.40–1.20)
GFR: 105.69 mL/min (ref >60.00)
Glucose, Bld: 84 mg/dL (ref 70–99)
Potassium: 4.2 mEq/L (ref 3.5–5.1)
Sodium: 139 mEq/L (ref 135–145)

## 2022-02-19 MED ORDER — VALACYCLOVIR HCL 1 G PO TABS
1000.0000 mg | ORAL_TABLET | Freq: Three times a day (TID) | ORAL | 0 refills | Status: DC
  Filled 2022-02-19: qty 21, 7d supply, fill #0

## 2022-02-19 MED ORDER — NEOMYCIN-POLYMYXIN-HC 3.5-10000-1 OT SUSP
3.0000 [drp] | Freq: Four times a day (QID) | OTIC | 0 refills | Status: DC
  Filled 2022-02-19 – 2022-03-04 (×3): qty 10, 17d supply, fill #0

## 2022-02-19 NOTE — Progress Notes (Signed)
Established Patient Office Visit  Subjective   Patient ID: Angela Stark, female    DOB: 20-Dec-1970  Age: 51 y.o. MRN: [id number]  Chief Complaint  Patient presents with   Follow-up    3 month follow up, right side of face and ear tenderness. Patient not fasting.     HPI follow up of obesity, hypertension and hypokalemia.  Her 1 year old father passed this month on the ninth.  She quit smoking.  Does believe that the [facility] is being.  Blood pressure well controlled with amlodipine and chlorthalidone.  She is stressed.  She is experiencing burning paresthesias and sensitivity to touch behind her right ear.  Right ear canal is painful.    Review of Systems  Constitutional: Negative.   HENT: Negative.    Eyes:  Negative for blurred vision, discharge and redness.  Respiratory: Negative.    Cardiovascular: Negative.   Gastrointestinal:  Negative for abdominal pain.  Genitourinary: Negative.   Musculoskeletal: Negative.  Negative for myalgias.  Skin:  Negative for itching and rash.  Neurological:  Negative for tingling, loss of consciousness and weakness.  Endo/Heme/Allergies:  Negative for polydipsia.      Objective:     BP 124/72 (BP Location: Right Arm, Patient Position: Sitting, Cuff Size: Large)   Pulse 78   Temp (!) 96 F (35.6 C) (Temporal)   Ht 5\' 7"  (1.702 m)   Wt 238 lb 3.2 oz (108 kg)   SpO2 97%   BMI 37.31 kg/m  Wt Readings from Last 3 Encounters:  02/19/22 238 lb 3.2 oz (108 kg)  12/30/21 237 lb (107.5 kg)  11/10/21 237 lb 9.6 oz (107.8 kg)      Physical Exam Constitutional:      General: She is not in acute distress.    Appearance: Normal appearance. She is not ill-appearing, toxic-appearing or diaphoretic.  HENT:     Head: Normocephalic and atraumatic.     Comments: Right canal    Right Ear: External ear normal.     Left Ear: External ear normal.     Ears:     Comments: Right canal with erythema and swelling.    Mouth/Throat:     Mouth:  Mucous membranes are moist.     Pharynx: Oropharynx is clear. No oropharyngeal exudate or posterior oropharyngeal erythema.  Eyes:     General: No scleral icterus.       Right eye: No discharge.        Left eye: No discharge.     Extraocular Movements: Extraocular movements intact.     Conjunctiva/sclera: Conjunctivae normal.  Cardiovascular:     Rate and Rhythm: Normal rate and regular rhythm.  Pulmonary:     Effort: Pulmonary effort is normal. No respiratory distress.     Breath sounds: Normal breath sounds.  Abdominal:     Tenderness: There is no guarding.  Musculoskeletal:     Cervical back: No rigidity or tenderness.  Skin:    General: Skin is warm and dry.       Neurological:     Mental Status: She is alert and oriented to person, place, and time.  Psychiatric:        Mood and Affect: Mood normal.        Behavior: Behavior normal.      No results found for any visits on 02/19/22.    The 10-year ASCVD risk score (Arnett DK, et al., 2019) is: 2.8%    Assessment & Plan:   Problem  List Items Addressed This Visit       Cardiovascular and Mediastinum   Essential hypertension   Relevant Orders   Basic metabolic panel     Other   Hypokalemia   Relevant Orders   Basic metabolic panel   Class 3 severe obesity due to excess calories with body mass index (BMI) of 40.0 to 44.9 in adult [facility])   Need for shingles vaccine - Primary   Relevant Orders   Varicella-zoster vaccine IM   Grieving   Herpes zoster without complication   Relevant Medications   valACYclovir (VALTREX) 1000 MG tablet   Other Visit Diagnoses     Acute otitis externa of right ear, unspecified type       Relevant Medications   neomycin-polymyxin-hydrocortisone (CORTISPORIN) 3.5-10000-1 OTIC suspension       Return in about 6 months (around 08/20/2022), or if symptoms worsen or fail to improve.  Atypical presentation of zoster probably secondary to recent immunization for shingles.  We will  go ahead and treat with Valtrex milligram 3 times daily for 7 days.  .  Continue semaglutide.  Could consider Mounjaro.  Mliss Sax, MD

## 2022-02-20 ENCOUNTER — Telehealth: Payer: Self-pay | Admitting: Family Medicine

## 2022-02-20 ENCOUNTER — Encounter: Payer: Self-pay | Admitting: Family Medicine

## 2022-02-20 ENCOUNTER — Other Ambulatory Visit (HOSPITAL_COMMUNITY): Payer: Self-pay

## 2022-02-20 NOTE — Telephone Encounter (Signed)
Patient aware that there is no lab test to determine shingles

## 2022-02-20 NOTE — Telephone Encounter (Signed)
Pt called stating [employer] put her out of work until Sunday for shingles. Pt says she has been taking Valtrex and it does not appear to be healing the rash. Pt asking: Is there a lab draw that shows positive for shingles that she could have done today? She needs to know for work this Sunday. Pt ph [phone]

## 2022-03-02 ENCOUNTER — Other Ambulatory Visit (HOSPITAL_COMMUNITY): Payer: Self-pay

## 2022-03-03 ENCOUNTER — Other Ambulatory Visit: Payer: Self-pay | Admitting: Family Medicine

## 2022-03-03 DIAGNOSIS — I10 Essential (primary) hypertension: Secondary | ICD-10-CM

## 2022-03-03 MED ORDER — AMLODIPINE BESYLATE 10 MG PO TABS
10.0000 mg | ORAL_TABLET | Freq: Every day | ORAL | 4 refills | Status: DC
  Filled 2022-03-03 – 2022-08-03 (×3): qty 90, 90d supply, fill #0
  Filled 2023-02-01: qty 90, 90d supply, fill #1
  Filled 2023-02-03: qty 90, 90d supply, fill #2

## 2022-03-04 ENCOUNTER — Other Ambulatory Visit: Payer: Self-pay

## 2022-03-04 ENCOUNTER — Other Ambulatory Visit (HOSPITAL_COMMUNITY): Payer: Self-pay

## 2022-03-05 ENCOUNTER — Other Ambulatory Visit (HOSPITAL_COMMUNITY): Payer: Self-pay

## 2022-03-05 ENCOUNTER — Encounter (HOSPITAL_COMMUNITY): Payer: Self-pay

## 2022-03-09 ENCOUNTER — Other Ambulatory Visit (HOSPITAL_COMMUNITY): Payer: Self-pay

## 2022-03-09 ENCOUNTER — Encounter (HOSPITAL_COMMUNITY): Payer: Self-pay

## 2022-03-11 ENCOUNTER — Other Ambulatory Visit: Payer: Self-pay

## 2022-03-25 ENCOUNTER — Telehealth: Payer: 59

## 2022-03-25 NOTE — Telephone Encounter (Signed)
Ran patients benefits for bilateral zilretta, according to [employer] is required.  Faxed over pre-determination form to [phone] along with officer notes and MRI results

## 2022-03-30 NOTE — Telephone Encounter (Signed)
Received fax from [place] Bilateral Orvan Seen is denied because extended release triamcinolone has not demonstrated a significant improvement in OA pain compared with the immediate release formulation. A letter stating this has been sent to patient also.

## 2022-04-01 ENCOUNTER — Other Ambulatory Visit (HOSPITAL_COMMUNITY): Payer: Self-pay

## 2022-04-01 ENCOUNTER — Other Ambulatory Visit: Payer: Self-pay | Admitting: Family Medicine

## 2022-04-01 DIAGNOSIS — I10 Essential (primary) hypertension: Secondary | ICD-10-CM

## 2022-04-02 ENCOUNTER — Other Ambulatory Visit: Payer: Self-pay

## 2022-04-02 ENCOUNTER — Other Ambulatory Visit (HOSPITAL_COMMUNITY): Payer: Self-pay

## 2022-04-02 MED ORDER — CHLORTHALIDONE 25 MG PO TABS
25.0000 mg | ORAL_TABLET | Freq: Every day | ORAL | 3 refills | Status: DC
  Filled 2022-04-02: qty 90, 90d supply, fill #0
  Filled 2022-08-03: qty 90, 90d supply, fill #1
  Filled 2023-02-01: qty 90, 90d supply, fill #2

## 2022-04-03 NOTE — Telephone Encounter (Signed)
Bilateral Durolane benefits verified via [email or web address].

## 2022-04-09 ENCOUNTER — Encounter: Payer: Self-pay | Admitting: Family Medicine

## 2022-04-09 ENCOUNTER — Ambulatory Visit: Payer: 59 | Admitting: Family Medicine

## 2022-04-09 ENCOUNTER — Ambulatory Visit: Payer: Self-pay

## 2022-04-09 VITALS — BP 126/82 | Ht 66.0 in | Wt 235.0 lb

## 2022-04-09 DIAGNOSIS — M170 Bilateral primary osteoarthritis of knee: Secondary | ICD-10-CM

## 2022-04-09 MED ORDER — TRIAMCINOLONE ACETONIDE 40 MG/ML IJ SUSP
40.0000 mg | Freq: Once | INTRAMUSCULAR | Status: AC
  Administered 2022-04-09: 40 mg via INTRA_ARTICULAR

## 2022-04-09 NOTE — Progress Notes (Signed)
Angela Stark - 52 y.o. female MRN [id number]  Date of birth: 25-Jul-1970  SUBJECTIVE:  Including CC & ROS.  No chief complaint on file.   Angela Stark is a 52 y.o. female that is  presenting with acute on chronic bilateral knee pain. Having swelling. No injury. Similar to her previous pain.    Review of Systems See HPI   HISTORY: Past Medical, Surgical, Social, and Family History Reviewed & Updated per EMR.   Pertinent Historical Findings include:  Past Medical History:  Diagnosis Date   Anemia    Anxiety    Back pain    Depression    Fatty liver    GERD (gastroesophageal reflux disease)    Heart murmur    When pregnant   Hyperlipidemia    Hypertension    Joint pain    Knee pain    Muscle stiffness    Phlebitis    Shortness of breath    Shortness of breath on exertion    Sleep apnea    Sleep apnea    Stress    Swelling of both lower extremities    Trouble in sleeping     Past Surgical History:  Procedure Laterality Date   OVARY SURGERY       PHYSICAL EXAM:  VS: BP 126/82   Ht 5\' 6"  (1.676 m)   Wt 235 lb (106.6 kg)   BMI 37.93 kg/m  Physical Exam Gen: NAD, alert, cooperative with exam, well-appearing MSK:  Neurovascularly intact     Aspiration/Injection Procedure Note [name] 1970-11-17  Procedure: Injection and aspiration Indications: right knee pain  Procedure Details Consent: Risks of procedure as well as the alternatives and risks of each were explained to the (patient/caregiver).  Consent for procedure obtained. Time Out: Verified patient identification, verified procedure, site/side was marked, verified correct patient position, special equipment/implants available, medications/allergies/relevent history reviewed, required imaging and test results available.  Performed.  The area was cleaned with iodine and alcohol swabs.    The right knee superior lateral suprapatellar pouch was injected using 1 cc of 1% lidocaine  on a 22-gauge 1-1/2 inch needle.  An 18-gauge 1-1/2 inch needle was used to achieve aspiration.  The syringe was switched to mixture containing 1 cc's of 40 mg Kenalog and 4 cc's of 0.25% bupivacaine was injected.  Ultrasound was used. Images were obtained in long views showing the injection.    Amount of Fluid Aspirated:  13mL Character of Fluid: clear and straw colored Fluid was sent for: n/a A sterile dressing was applied.  Patient did tolerate procedure well.   Aspiration/Injection Procedure Note [name] Nov 18, 1970  Procedure: Injection and aspiration Indications: left knee pain  Procedure Details Consent: Risks of procedure as well as the alternatives and risks of each were explained to the (patient/caregiver).  Consent for procedure obtained. Time Out: Verified patient identification, verified procedure, site/side was marked, verified correct patient position, special equipment/implants available, medications/allergies/relevent history reviewed, required imaging and test results available.  Performed.  The area was cleaned with iodine and alcohol swabs.    The left knee superior lateral suprapatellar pouch was injected using 1 cc of 1% lidocaine on a 22-gauge 1-1/2 inch needle.  An 18-gauge 1-1/2 inch needle was used to achieve aspiration.  The syringe was switched to mixture containing 1 cc's of 40 mg Kenalog and 4 cc's of 0.25% bupivacaine was injected.  Ultrasound was used. Images were obtained in long views showing the injection.  Amount of Fluid Aspirated: 49mL Character of Fluid: clear and straw colored Fluid was sent for: n/a A sterile dressing was applied.  Patient did tolerate procedure well.     ASSESSMENT & PLAN:   Primary osteoarthritis of both knees Acute on chronic in nature. Exacerbation of her underlying degenerative changes - counseled on home exercise therapy and supportive care - bilateral aspiration and injection  - pursue gel  injections

## 2022-04-09 NOTE — Patient Instructions (Signed)
Good to see you Please use ice as needed  We'll let you know the benefits of the gel injections   Please send me a message in MyChart with any questions or updates.  Please see me back to have the gel injections.   --Dr. Raeford Razor

## 2022-04-09 NOTE — Assessment & Plan Note (Signed)
Acute on chronic in nature. Exacerbation of her underlying degenerative changes - counseled on home exercise therapy and supportive care - bilateral aspiration and injection  - pursue gel injections

## 2022-04-23 ENCOUNTER — Encounter: Payer: Self-pay | Admitting: Family Medicine

## 2022-04-23 NOTE — Telephone Encounter (Signed)
Patient's new [employer] plan prefers Mono/Orthovisc for gel injections for bilat OA of knees. Verified benefits for Monovisc. Clinicals faxed to [facility] @ [phone]. Pt informed via MyChart.

## 2022-04-28 ENCOUNTER — Encounter: Payer: Self-pay | Admitting: *Deleted

## 2022-04-28 NOTE — Telephone Encounter (Signed)
Rec'd fax from [place] stating [place] is approved 04/30/22 - 04/30/23. Pt informed. Monovisc ordered with rep. Will call patient back to schedule.

## 2022-05-01 ENCOUNTER — Ambulatory Visit: Payer: 59 | Admitting: Family Medicine

## 2022-05-01 ENCOUNTER — Encounter: Payer: Self-pay | Admitting: Family Medicine

## 2022-05-01 ENCOUNTER — Ambulatory Visit: Payer: Self-pay

## 2022-05-01 VITALS — BP 128/80 | Ht 66.0 in | Wt 235.0 lb

## 2022-05-01 DIAGNOSIS — M170 Bilateral primary osteoarthritis of knee: Secondary | ICD-10-CM

## 2022-05-01 MED ORDER — HYALURONAN 88 MG/4ML IX SOSY
88.0000 mg | PREFILLED_SYRINGE | Freq: Once | INTRA_ARTICULAR | Status: AC
  Administered 2022-05-01: 88 mg via INTRA_ARTICULAR

## 2022-05-01 NOTE — Patient Instructions (Signed)
Good to see you Please use ice as needed  Please send me a message in MyChart with any questions or updates.  Please see me back as needed.   --Dr. Kaulder Zahner  

## 2022-05-01 NOTE — Progress Notes (Signed)
Angela Stark - 52 y.o. female MRN [id number]  Date of birth: 05/16/1970  SUBJECTIVE:  Including CC & ROS.  No chief complaint on file.   Angela Stark is a 52 y.o. female that is here for gel injections.    Review of Systems See HPI   HISTORY: Past Medical, Surgical, Social, and Family History Reviewed & Updated per EMR.   Pertinent Historical Findings include:  Past Medical History:  Diagnosis Date   Anemia    Anxiety    Back pain    Depression    Fatty liver    GERD (gastroesophageal reflux disease)    Heart murmur    When pregnant   Hyperlipidemia    Hypertension    Joint pain    Knee pain    Muscle stiffness    Phlebitis    Shortness of breath    Shortness of breath on exertion    Sleep apnea    Sleep apnea    Stress    Swelling of both lower extremities    Trouble in sleeping     Past Surgical History:  Procedure Laterality Date   OVARY SURGERY       PHYSICAL EXAM:  VS: BP 128/80 (BP Location: Left Arm, Patient Position: Sitting)   Ht 5' 6"$  (1.676 m)   Wt 235 lb (106.6 kg)   BMI 37.93 kg/m  Physical Exam Gen: NAD, alert, cooperative with exam, well-appearing MSK:  Neurovascularly intact     Aspiration/Injection Procedure Note Angela Stark 09-17-1970  Procedure: Injection Indications: right knee pain  Procedure Details Consent: Risks of procedure as well as the alternatives and risks of each were explained to the (patient/caregiver).  Consent for procedure obtained. Time Out: Verified patient identification, verified procedure, site/side was marked, verified correct patient position, special equipment/implants available, medications/allergies/relevent history reviewed, required imaging and test results available.  Performed.  The area was cleaned with iodine and alcohol swabs.    The right knee superior lateral suprapatellar pouch was injected using 4 cc's of 1% lidocaine and 0.4 cc of 8.4% sodium bicarbonate with a 21 2"  needle.  The syringe was switched and 4 mL of 22 mg/mL of monovisc was injected. Ultrasound was used. Images were obtained in  Long views showing the injection.     A sterile dressing was applied.  Patient did tolerate procedure well.  Aspiration/Injection Procedure Note Angela Stark 1970-08-21  Procedure: Aspiration and Injection Indications: left knee pain  Procedure Details Consent: Risks of procedure as well as the alternatives and risks of each were explained to the (patient/caregiver).  Consent for procedure obtained. Time Out: Verified patient identification, verified procedure, site/side was marked, verified correct patient position, special equipment/implants available, medications/allergies/relevent history reviewed, required imaging and test results available.  Performed.  The area was cleaned with iodine and alcohol swabs.    The left knee superior lateral suprapatellar pouch was injected using 4 cc's of 1% lidocaine and 0.4 cc of 8.4% sodium bicarbonate with a 21 2" needle.  An 18-gauge 1-1/2 inch needle was used to achieve aspiration.  The syringe was switched and 4 mL of 22 mg/mL of monovisc was injected Ultrasound was used. Images were obtained in  Long views showing the injection.    Amount of Fluid Aspirated:  19m Character of Fluid: clear and straw colored Fluid was sent for: n/a A sterile dressing was applied.  Patient did tolerate procedure well.    ASSESSMENT & PLAN:   Primary osteoarthritis  of both knees Completed bilateral monovisc injections.  - green sport insoles with lateral postings

## 2022-05-01 NOTE — Assessment & Plan Note (Addendum)
Completed bilateral monovisc injections.  - green sport insoles with lateral postings

## 2022-05-08 ENCOUNTER — Other Ambulatory Visit (HOSPITAL_COMMUNITY): Payer: Self-pay

## 2022-06-05 ENCOUNTER — Telehealth: Payer: Self-pay

## 2022-06-05 NOTE — Telephone Encounter (Signed)
Ran patients benefits via [employer] for bilateral Zilretta. Ran for pharmacy benefits.  Patient did receive bilateral Kenalog injection on 04/09/22.

## 2022-06-08 ENCOUNTER — Other Ambulatory Visit: Payer: Self-pay | Admitting: *Deleted

## 2022-06-08 ENCOUNTER — Other Ambulatory Visit (HOSPITAL_COMMUNITY): Payer: Self-pay

## 2022-06-08 ENCOUNTER — Other Ambulatory Visit: Payer: Self-pay

## 2022-06-08 MED ORDER — ZILRETTA 32 MG IX SRER
32.0000 mg | Freq: Once | INTRA_ARTICULAR | 0 refills | Status: AC
  Filled 2022-06-08: qty 2, 2d supply, fill #0
  Filled 2022-06-09: qty 2, 90d supply, fill #0

## 2022-06-09 ENCOUNTER — Other Ambulatory Visit: Payer: Self-pay

## 2022-06-09 ENCOUNTER — Other Ambulatory Visit (HOSPITAL_COMMUNITY): Payer: Self-pay

## 2022-06-10 ENCOUNTER — Other Ambulatory Visit: Payer: Self-pay

## 2022-06-10 ENCOUNTER — Other Ambulatory Visit (HOSPITAL_COMMUNITY): Payer: Self-pay

## 2022-06-10 ENCOUNTER — Other Ambulatory Visit (HOSPITAL_BASED_OUTPATIENT_CLINIC_OR_DEPARTMENT_OTHER): Payer: Self-pay

## 2022-06-10 MED ORDER — ACYCLOVIR 5 % EX OINT
1.0000 | TOPICAL_OINTMENT | Freq: Four times a day (QID) | CUTANEOUS | 1 refills | Status: DC
  Filled 2022-06-10: qty 15, 14d supply, fill #0

## 2022-06-10 MED ORDER — VALACYCLOVIR HCL 1 G PO TABS
1000.0000 mg | ORAL_TABLET | Freq: Two times a day (BID) | ORAL | 12 refills | Status: DC
  Filled 2022-06-10: qty 4, 2d supply, fill #0

## 2022-06-11 ENCOUNTER — Other Ambulatory Visit (HOSPITAL_BASED_OUTPATIENT_CLINIC_OR_DEPARTMENT_OTHER): Payer: Self-pay

## 2022-06-11 ENCOUNTER — Other Ambulatory Visit (HOSPITAL_COMMUNITY): Payer: Self-pay

## 2022-06-16 ENCOUNTER — Other Ambulatory Visit (HOSPITAL_COMMUNITY): Payer: Self-pay

## 2022-06-16 MED ORDER — AMOXICILLIN 500 MG PO CAPS
500.0000 mg | ORAL_CAPSULE | Freq: Three times a day (TID) | ORAL | 0 refills | Status: DC
  Filled 2022-06-16: qty 32, 11d supply, fill #0

## 2022-06-23 ENCOUNTER — Other Ambulatory Visit (HOSPITAL_BASED_OUTPATIENT_CLINIC_OR_DEPARTMENT_OTHER): Payer: Self-pay

## 2022-06-24 ENCOUNTER — Other Ambulatory Visit: Payer: Self-pay

## 2022-06-24 ENCOUNTER — Encounter: Payer: Self-pay | Admitting: Family Medicine

## 2022-06-24 ENCOUNTER — Ambulatory Visit: Payer: 59 | Admitting: Family Medicine

## 2022-06-24 VITALS — BP 138/70 | Ht 66.0 in | Wt 235.0 lb

## 2022-06-24 DIAGNOSIS — M170 Bilateral primary osteoarthritis of knee: Secondary | ICD-10-CM | POA: Diagnosis not present

## 2022-06-24 MED ORDER — TRIAMCINOLONE ACETONIDE 32 MG IX SRER
32.0000 mg | Freq: Once | INTRA_ARTICULAR | Status: AC
Start: 2022-06-24 — End: 2022-06-24
  Administered 2022-06-24: 32 mg via INTRA_ARTICULAR

## 2022-06-24 NOTE — Progress Notes (Signed)
Angela Stark - 52 y.o. female MRN [id number]  Date of birth: [birth year]  SUBJECTIVE:  Including CC & ROS.  No chief complaint on file.   Angela Stark is a 52 y.o. female that is here for Zilretta injections.    Review of Systems See HPI   HISTORY: Past Medical, Surgical, Social, and Family History Reviewed & Updated per EMR.   Pertinent Historical Findings include:  Past Medical History:  Diagnosis Date   Anemia    Anxiety    Back pain    Depression    Fatty liver    GERD (gastroesophageal reflux disease)    Heart murmur    When pregnant   Hyperlipidemia    Hypertension    Joint pain    Knee pain    Muscle stiffness    Phlebitis    Shortness of breath    Shortness of breath on exertion    Sleep apnea    Sleep apnea    Stress    Swelling of both lower extremities    Trouble in sleeping     Past Surgical History:  Procedure Laterality Date   OVARY SURGERY       PHYSICAL EXAM:  VS: BP 138/70 (BP Location: Left Arm, Patient Position: Sitting)   Ht 5\' 6"  (1.676 m)   Wt 235 lb (106.6 kg)   BMI 37.93 kg/m  Physical Exam Gen: NAD, alert, cooperative with exam, well-appearing MSK:  Neurovascularly intact     Aspiration/Injection Procedure Note LAWRENCE HILLER 1970-08-22  Procedure: Aspiration and Injection Indications: Right knee pain  Procedure Details Consent: Risks of procedure as well as the alternatives and risks of each were explained to the (patient/caregiver).  Consent for procedure obtained. Time Out: Verified patient identification, verified procedure, site/side was marked, verified correct patient position, special equipment/implants available, medications/allergies/relevent history reviewed, required imaging and test results available.  Performed.  The area was cleaned with iodine and alcohol swabs.    The right knee superior lateral suprapatellar pouch was injected using 3 cc of 1% lidocaine and 0.3 cc of 8.4% sodium  bicarbonate on a 25-gauge 1-1/2 inch needle.  An 18-gauge 1-1/2 needle was used to achieve aspiration.  The syringe was switched and a mixture containing 5 cc's of 32 mg Zilretta and 4 cc's of 0.25% bupivacaine was injected.  Ultrasound was used. Images were obtained in long views showing the injection.   Amount of Fluid Aspirated:  79mL Character of Fluid: clear and straw colored Fluid was sent for: n/a  A sterile dressing was applied.  Patient did tolerate procedure well.     Aspiration/Injection Procedure Note Angela Stark [birth year]  Procedure: Aspiration and Injection Indications: Left knee pain  Procedure Details Consent: Risks of procedure as well as the alternatives and risks of each were explained to the (patient/caregiver).  Consent for procedure obtained. Time Out: Verified patient identification, verified procedure, site/side was marked, verified correct patient position, special equipment/implants available, medications/allergies/relevent history reviewed, required imaging and test results available.  Performed.  The area was cleaned with iodine and alcohol swabs.    The left knee superior lateral suprapatellar pouch was injected using 3 cc of 1% lidocaine and 0.3 cc of 8.4% sodium bicarb and on a 25-gauge 1-1/2 inch needle.  An 18-gauge 1-1/2 needle was used to achieve aspiration.  The syringe was switched and a mixture containing 5 cc's of 32 mg Zilretta and 4 cc's of 0.25% bupivacaine was injected.  Ultrasound was used. Images  were obtained in long views showing the injection.   Amount of Fluid Aspirated:  57mL Character of Fluid: clear, bloody, and straw colored Fluid was sent for: n/a  A sterile dressing was applied.  Patient did tolerate procedure well.       ASSESSMENT & PLAN:   Primary osteoarthritis of both knees Completed bilateral Zilretta injections.

## 2022-06-24 NOTE — Patient Instructions (Signed)
Good to see you Please use ice as needed   Please send me a message in MyChart with any questions or updates.  Please see me back as needed.   --Dr. Krisna Omar  

## 2022-06-24 NOTE — Assessment & Plan Note (Signed)
Completed bilateral Zilretta injections.

## 2022-07-06 ENCOUNTER — Encounter: Payer: Self-pay | Admitting: *Deleted

## 2022-07-08 ENCOUNTER — Other Ambulatory Visit (HOSPITAL_COMMUNITY): Payer: Self-pay

## 2022-07-10 ENCOUNTER — Other Ambulatory Visit (HOSPITAL_COMMUNITY): Payer: Self-pay

## 2022-07-17 ENCOUNTER — Other Ambulatory Visit (HOSPITAL_COMMUNITY): Payer: Self-pay

## 2022-07-21 ENCOUNTER — Other Ambulatory Visit (HOSPITAL_COMMUNITY): Payer: Self-pay

## 2022-07-30 ENCOUNTER — Other Ambulatory Visit: Payer: Self-pay

## 2022-08-03 ENCOUNTER — Other Ambulatory Visit: Payer: Self-pay

## 2022-09-08 ENCOUNTER — Other Ambulatory Visit (HOSPITAL_COMMUNITY): Payer: Self-pay

## 2022-09-17 ENCOUNTER — Other Ambulatory Visit (HOSPITAL_COMMUNITY): Payer: Self-pay

## 2022-10-09 ENCOUNTER — Ambulatory Visit: Payer: 59 | Admitting: Sports Medicine

## 2022-10-13 ENCOUNTER — Ambulatory Visit: Payer: Self-pay | Admitting: Sports Medicine

## 2022-10-13 ENCOUNTER — Other Ambulatory Visit: Payer: Self-pay

## 2022-10-13 DIAGNOSIS — M170 Bilateral primary osteoarthritis of knee: Secondary | ICD-10-CM

## 2022-11-03 ENCOUNTER — Other Ambulatory Visit (INDEPENDENT_AMBULATORY_CARE_PROVIDER_SITE_OTHER): Payer: 59

## 2022-11-03 ENCOUNTER — Ambulatory Visit (INDEPENDENT_AMBULATORY_CARE_PROVIDER_SITE_OTHER): Payer: 59 | Admitting: Orthopaedic Surgery

## 2022-11-03 ENCOUNTER — Other Ambulatory Visit: Payer: Self-pay

## 2022-11-03 VITALS — Ht 66.25 in | Wt 259.0 lb

## 2022-11-03 DIAGNOSIS — G8929 Other chronic pain: Secondary | ICD-10-CM

## 2022-11-03 DIAGNOSIS — M25562 Pain in left knee: Secondary | ICD-10-CM

## 2022-11-03 DIAGNOSIS — M1712 Unilateral primary osteoarthritis, left knee: Secondary | ICD-10-CM | POA: Diagnosis not present

## 2022-11-03 DIAGNOSIS — M25561 Pain in right knee: Secondary | ICD-10-CM

## 2022-11-03 DIAGNOSIS — M1711 Unilateral primary osteoarthritis, right knee: Secondary | ICD-10-CM | POA: Diagnosis not present

## 2022-11-03 MED ORDER — LIDOCAINE HCL 1 % IJ SOLN
3.0000 mL | INTRAMUSCULAR | Status: AC | PRN
Start: 2022-11-03 — End: 2022-11-03
  Administered 2022-11-03: 3 mL

## 2022-11-03 MED ORDER — METHYLPREDNISOLONE ACETATE 40 MG/ML IJ SUSP
40.0000 mg | INTRAMUSCULAR | Status: AC | PRN
Start: 2022-11-03 — End: 2022-11-03
  Administered 2022-11-03: 40 mg via INTRA_ARTICULAR

## 2022-11-03 NOTE — Progress Notes (Signed)
The patient is a very pleasant 52 year old female who works within the [employer] system.  She has been followed by primary care sports medicine for some time now and has had steroid injections in both knees as well as injections with Zilretta in both knees.  These used to work quite a bit but now its gotten to where none of these types of injections worked.  She reports worse pain with the right knee than the left knee.  She is not a diabetic.  Her BMI is 41.49.  She has lost a significant amount of weight at 1 point with over 30 pounds but did put this back on.  She was on weight loss medication which is no longer covered under insurance.  Hyaluronic acid injections are not covered under her insurance plan either.  She is frustrated about the daily pain she gets in both knees and they are slowly worsening.  She is active at 63.  Both knees show slight effusion.  Both knees have slight varus malalignment when she stands.  Both knees have good range of motion with significant global tenderness and patellofemoral crepitation.  Both knees are ligamentously stable.  X-rays of both knees shows significant tricompartmental arthritis.  It is worse on the right knee than the left knee but both knees have osteophytes in all 3 compartments and significant joint space narrowing.  We did talk about her being on a weight loss journey and continuing quadrant knee exercises and trying to lose weight to get a lower BMI in order to qualify for surgery.  We did talk about aspirations and steroid injections in both knees today.  I was able to aspirate 45 to 50 cc of yellow fluid from the right knee and 35 cc from the left knee.  Steroids were placed in both knees.  I would like to see her back in 2 months for repeat weight and BMI calculation.  We will also set her up for outpatient physical therapy for strengthening the muscles around both knees.  She agrees with this treatment plan.    Procedure Note  Patient: Angela Stark             Date of Birth: May 30, 1970           MRN: [id number]             Visit Date: 11/03/2022  Procedures: Visit Diagnoses:  1. Chronic pain of left knee   2. Chronic pain of right knee   3. Unilateral primary osteoarthritis, right knee   4. Unilateral primary osteoarthritis, left knee     Large Joint Inj: R knee on 11/03/2022 8:57 AM Indications: diagnostic evaluation and pain Details: 22 G 1.5 in needle, superolateral approach  Arthrogram: No  Medications: 3 mL lidocaine 1 %; 40 mg methylPREDNISolone acetate 40 MG/ML Outcome: tolerated well, no immediate complications Procedure, treatment alternatives, risks and benefits explained, specific risks discussed. Consent was given by the patient. Immediately prior to procedure a time out was called to verify the correct patient, procedure, equipment, support staff and site/side marked as required. Patient was prepped and draped in the usual sterile fashion.    Large Joint Inj: L knee on 11/03/2022 8:57 AM Indications: diagnostic evaluation and pain Details: 22 G 1.5 in needle, superolateral approach  Arthrogram: No  Medications: 3 mL lidocaine 1 %; 40 mg methylPREDNISolone acetate 40 MG/ML Outcome: tolerated well, no immediate complications Procedure, treatment alternatives, risks and benefits explained, specific risks discussed. Consent was given by  the patient. Immediately prior to procedure a time out was called to verify the correct patient, procedure, equipment, support staff and site/side marked as required. Patient was prepped and draped in the usual sterile fashion.

## 2022-11-10 ENCOUNTER — Ambulatory Visit: Payer: 59 | Attending: Orthopaedic Surgery | Admitting: Physical Therapy

## 2022-12-31 DIAGNOSIS — G4733 Obstructive sleep apnea (adult) (pediatric): Secondary | ICD-10-CM | POA: Diagnosis not present

## 2023-01-20 ENCOUNTER — Other Ambulatory Visit (HOSPITAL_COMMUNITY): Payer: Self-pay

## 2023-01-20 MED ORDER — AMOXICILLIN 500 MG PO CAPS
500.0000 mg | ORAL_CAPSULE | Freq: Three times a day (TID) | ORAL | 0 refills | Status: DC
  Filled 2023-01-20: qty 32, 11d supply, fill #0

## 2023-01-31 DIAGNOSIS — G4733 Obstructive sleep apnea (adult) (pediatric): Secondary | ICD-10-CM | POA: Diagnosis not present

## 2023-02-01 ENCOUNTER — Other Ambulatory Visit: Payer: Self-pay | Admitting: Family Medicine

## 2023-02-01 ENCOUNTER — Other Ambulatory Visit: Payer: Self-pay

## 2023-02-01 DIAGNOSIS — F17200 Nicotine dependence, unspecified, uncomplicated: Secondary | ICD-10-CM

## 2023-02-01 DIAGNOSIS — E7800 Pure hypercholesterolemia, unspecified: Secondary | ICD-10-CM

## 2023-02-01 DIAGNOSIS — F419 Anxiety disorder, unspecified: Secondary | ICD-10-CM

## 2023-02-03 ENCOUNTER — Other Ambulatory Visit: Payer: Self-pay | Admitting: Family Medicine

## 2023-02-03 ENCOUNTER — Encounter: Payer: Self-pay | Admitting: Family Medicine

## 2023-02-03 ENCOUNTER — Other Ambulatory Visit: Payer: Self-pay

## 2023-02-03 DIAGNOSIS — F17200 Nicotine dependence, unspecified, uncomplicated: Secondary | ICD-10-CM

## 2023-02-03 DIAGNOSIS — E7800 Pure hypercholesterolemia, unspecified: Secondary | ICD-10-CM

## 2023-02-03 DIAGNOSIS — E66813 Body mass index (BMI) 40.0-44.9, adult: Secondary | ICD-10-CM

## 2023-02-03 DIAGNOSIS — F419 Anxiety disorder, unspecified: Secondary | ICD-10-CM

## 2023-02-10 ENCOUNTER — Other Ambulatory Visit (HOSPITAL_COMMUNITY): Payer: Self-pay

## 2023-03-01 ENCOUNTER — Other Ambulatory Visit: Payer: Self-pay | Admitting: Obstetrics and Gynecology

## 2023-03-01 DIAGNOSIS — Z01419 Encounter for gynecological examination (general) (routine) without abnormal findings: Secondary | ICD-10-CM | POA: Diagnosis not present

## 2023-03-01 DIAGNOSIS — N951 Menopausal and female climacteric states: Secondary | ICD-10-CM | POA: Diagnosis not present

## 2023-03-01 DIAGNOSIS — Z87891 Personal history of nicotine dependence: Secondary | ICD-10-CM

## 2023-03-01 DIAGNOSIS — Z124 Encounter for screening for malignant neoplasm of cervix: Secondary | ICD-10-CM | POA: Diagnosis not present

## 2023-03-01 DIAGNOSIS — Z6841 Body Mass Index (BMI) 40.0 and over, adult: Secondary | ICD-10-CM | POA: Diagnosis not present

## 2023-03-01 DIAGNOSIS — Z1151 Encounter for screening for human papillomavirus (HPV): Secondary | ICD-10-CM | POA: Diagnosis not present

## 2023-03-02 ENCOUNTER — Other Ambulatory Visit (HOSPITAL_COMMUNITY): Payer: Self-pay | Admitting: Obstetrics and Gynecology

## 2023-03-02 DIAGNOSIS — G4733 Obstructive sleep apnea (adult) (pediatric): Secondary | ICD-10-CM | POA: Diagnosis not present

## 2023-03-02 DIAGNOSIS — R011 Cardiac murmur, unspecified: Secondary | ICD-10-CM

## 2023-03-09 ENCOUNTER — Encounter: Payer: 59 | Admitting: Family Medicine

## 2023-03-16 ENCOUNTER — Ambulatory Visit (HOSPITAL_COMMUNITY)
Admission: RE | Admit: 2023-03-16 | Discharge: 2023-03-16 | Disposition: A | Discharge status: Home / Self Care | Payer: 59 | Source: Ambulatory Visit | Attending: Family Medicine | Admitting: Family Medicine

## 2023-03-16 DIAGNOSIS — R0602 Shortness of breath: Secondary | ICD-10-CM | POA: Insufficient documentation

## 2023-03-16 DIAGNOSIS — I517 Cardiomegaly: Secondary | ICD-10-CM | POA: Insufficient documentation

## 2023-03-16 DIAGNOSIS — I10 Essential (primary) hypertension: Secondary | ICD-10-CM | POA: Diagnosis not present

## 2023-03-16 DIAGNOSIS — R011 Cardiac murmur, unspecified: Secondary | ICD-10-CM

## 2023-03-18 LAB — ECHOCARDIOGRAM COMPLETE
AR max vel: 2.13 cm2
AV Peak grad: 18 mm[Hg]
Ao pk vel: 2.12 m/s
Area-P 1/2: 5.13 cm2
Calc EF: 76.5 %
S' Lateral: 3.2 cm
Single Plane A2C EF: 74 %
Single Plane A4C EF: 78.3 %

## 2023-03-22 ENCOUNTER — Encounter: Payer: Self-pay | Admitting: Family Medicine

## 2023-03-22 ENCOUNTER — Ambulatory Visit (INDEPENDENT_AMBULATORY_CARE_PROVIDER_SITE_OTHER): Payer: 59 | Admitting: Family Medicine

## 2023-03-22 VITALS — BP 134/76 | HR 72 | Temp 98.1°F | Ht 66.0 in | Wt 260.8 lb

## 2023-03-22 DIAGNOSIS — R002 Palpitations: Secondary | ICD-10-CM | POA: Diagnosis not present

## 2023-03-22 DIAGNOSIS — Z6841 Body Mass Index (BMI) 40.0 and over, adult: Secondary | ICD-10-CM | POA: Diagnosis not present

## 2023-03-22 DIAGNOSIS — Z131 Encounter for screening for diabetes mellitus: Secondary | ICD-10-CM | POA: Diagnosis not present

## 2023-03-22 DIAGNOSIS — Z0000 Encounter for general adult medical examination without abnormal findings: Secondary | ICD-10-CM

## 2023-03-22 DIAGNOSIS — I10 Essential (primary) hypertension: Secondary | ICD-10-CM

## 2023-03-22 DIAGNOSIS — E7800 Pure hypercholesterolemia, unspecified: Secondary | ICD-10-CM

## 2023-03-22 HISTORY — DX: Morbid (severe) obesity due to excess calories: E66.01

## 2023-03-22 HISTORY — DX: Palpitations: R00.2

## 2023-03-22 NOTE — Progress Notes (Signed)
Established Patient Office Visit   Subjective:  Patient ID: Angela Stark, female    DOB: [birth year]  Age: 52 y.o. MRN: [id number]  Chief Complaint  Patient presents with   Annual Exam    CPE. Pt is fasting is not fasting.     HPI Encounter Diagnoses  Name Primary?   Healthcare maintenance Yes   Essential hypertension    Elevated LDL cholesterol level    Screening for diabetes mellitus    Morbid obesity with BMI of 40.0-44.9, adult (HCC)    Palpitations    For physical and follow-up of above.  Continues with sobriety and smoking cessation!  Recent Pap smear.  GYN doc ordered echocardiogram for murmur, referral for colonoscopy, DEXA scan, mammogram and low-dose screening CT of chest.  She has regular dental care.  Has gained weight with smoking cessation.  GLP-1 agonist are not covered with health insurance.  End-stage arthrosis of right knee that needs replacement but needs to lose weight before surgery.  She is active physically at work.  She has regular dental care.  Continues amlodipine 10 mg and chlorthalidone 25 mg per controlled hypertension.  Continues atorvastatin 20 mg for cholesterol.  Continues to experience palpitations.  She denies any associated shortness of breath chest pain or lightheadedness but she tends to be lying in bed when they occur.   Review of Systems  Constitutional: Negative.   HENT: Negative.    Eyes:  Negative for blurred vision, discharge and redness.  Respiratory: Negative.  Negative for shortness of breath.   Cardiovascular:  Positive for palpitations. Negative for chest pain.  Gastrointestinal:  Negative for abdominal pain.  Genitourinary: Negative.   Musculoskeletal:  Positive for joint pain. Negative for myalgias.  Skin:  Negative for rash.  Neurological:  Negative for tingling, loss of consciousness and weakness.  Endo/Heme/Allergies:  Negative for polydipsia.     Current Outpatient Medications:    amLODipine (NORVASC) 10 MG  tablet, Take 1 tablet (10 mg total) by mouth daily., Disp: 90 tablet, Rfl: 4   atorvastatin (LIPITOR) 20 MG tablet, Take 1 tablet (20 mg total) by mouth daily., Disp: 90 tablet, Rfl: 3   chlorthalidone (HYGROTON) 25 MG tablet, Take 1 tablet (25 mg total) by mouth daily., Disp: 100 tablet, Rfl: 3   acyclovir ointment (ZOVIRAX) 5 %, Apply 1 application topically to affected area 4-5 times daily., Disp: 15 g, Rfl: 1   amoxicillin (AMOXIL) 500 MG capsule, Take 2 capsules by mouth now. Then take 1 capsule by mouth three times a day., Disp: 32 capsule, Rfl: 0   amoxicillin (AMOXIL) 500 MG capsule, Take 2 capsules now. Thereafter take 1 capsule by mouth 3 times daily (Patient not taking: Reported on 03/22/2023), Disp: 32 capsule, Rfl: 0   buPROPion (WELLBUTRIN XL) 150 MG 24 hr tablet, TAKE 1 TABLET BY MOUTH DAILY., Disp: 90 tablet, Rfl: 3   neomycin-polymyxin-hydrocortisone (CORTISPORIN) 3.5-10000-1 OTIC suspension, Place 3 drops into the right ear 4 times daily for 5 days., Disp: 10 mL, Rfl: 0   potassium chloride SA (KLOR-CON M) 20 MEQ tablet, Take 1 tablet (20 mEq total) by mouth daily., Disp: 90 tablet, Rfl: 1   Semaglutide-Weight Management (WEGOVY) 2.4 MG/0.75ML SOAJ, Inject 2.4 mg into the skin once a week. (Patient not taking: Reported on 03/22/2023), Disp: 9 mL, Rfl: 3   valACYclovir (VALTREX) 1000 MG tablet, Take 1 tablet by mouth 3 times daily., Disp: 21 tablet, Rfl: 0   valACYclovir (VALTREX) 1000 MG tablet, Take 1  tablet (1,000 mg total) by mouth 2 (two) times daily., Disp: 4 tablet, Rfl: 12   Objective:     BP 134/76   Pulse 72   Temp 98.1 F (36.7 C)   Ht 5\' 6"  (1.676 m)   Wt 260 lb 12.8 oz (118.3 kg)   SpO2 97%   BMI 42.09 kg/m  Wt Readings from Last 3 Encounters:  03/22/23 260 lb 12.8 oz (118.3 kg)  11/03/22 259 lb (117.5 kg)  06/24/22 235 lb (106.6 kg)      Physical Exam Constitutional:      General: She is not in acute distress.    Appearance: Normal appearance. She is  obese. She is not ill-appearing, toxic-appearing or diaphoretic.  HENT:     Head: Normocephalic and atraumatic.     Right Ear: Tympanic membrane, ear canal and external ear normal.     Left Ear: Tympanic membrane, ear canal and external ear normal.     Mouth/Throat:     Mouth: Mucous membranes are moist.     Pharynx: Oropharynx is clear. No oropharyngeal exudate or posterior oropharyngeal erythema.  Eyes:     General: No scleral icterus.       Right eye: No discharge.        Left eye: No discharge.     Extraocular Movements: Extraocular movements intact.     Conjunctiva/sclera: Conjunctivae normal.     Pupils: Pupils are equal, round, and reactive to light.  Cardiovascular:     Rate and Rhythm: Normal rate and regular rhythm.  Pulmonary:     Effort: Pulmonary effort is normal. No respiratory distress.     Breath sounds: Normal breath sounds.  Abdominal:     General: Bowel sounds are normal.     Tenderness: There is no abdominal tenderness. There is no guarding.  Musculoskeletal:     Cervical back: No rigidity or tenderness.  Skin:    General: Skin is warm and dry.  Neurological:     Mental Status: She is alert and oriented to person, place, and time.  Psychiatric:        Mood and Affect: Mood normal.        Behavior: Behavior normal.      No results found for any visits on 03/22/23.    The 10-year ASCVD risk score (Arnett DK, et al., 2019) is: 3.5%    Assessment & Plan:   Healthcare maintenance  Essential hypertension -     Urinalysis, Routine w reflex microscopic; Future -     CBC with Differential/Platelet; Future -     Comprehensive metabolic panel; Future  Elevated LDL cholesterol level -     Comprehensive metabolic panel; Future -     Lipid panel; Future  Screening for diabetes mellitus -     Comprehensive metabolic panel; Future -     Hemoglobin A1c; Future  Morbid obesity with BMI of 40.0-44.9, adult (HCC) -     Amb Referral to Bariatric  Surgery  Palpitations -     Ambulatory referral to Cardiology    Return in about 6 months (around 09/20/2023), or if symptoms worsen or fail to improve, for Return fasting for above ordered blood work..  Much health maintenance is pending.  Discussed a bariatric surgery referral and she would like to have a consultation.   Mliss Sax, MD

## 2023-03-26 ENCOUNTER — Encounter: Payer: Self-pay | Admitting: Internal Medicine

## 2023-04-15 ENCOUNTER — Other Ambulatory Visit: Payer: Self-pay

## 2023-04-15 ENCOUNTER — Other Ambulatory Visit (HOSPITAL_COMMUNITY): Payer: Self-pay

## 2023-04-15 ENCOUNTER — Other Ambulatory Visit: Payer: Self-pay | Admitting: Family Medicine

## 2023-04-15 DIAGNOSIS — F32A Depression, unspecified: Secondary | ICD-10-CM

## 2023-04-15 DIAGNOSIS — F17200 Nicotine dependence, unspecified, uncomplicated: Secondary | ICD-10-CM

## 2023-04-15 DIAGNOSIS — E7800 Pure hypercholesterolemia, unspecified: Secondary | ICD-10-CM

## 2023-04-15 DIAGNOSIS — E66813 Obesity, class 3: Secondary | ICD-10-CM

## 2023-04-15 MED ORDER — ATORVASTATIN CALCIUM 20 MG PO TABS
20.0000 mg | ORAL_TABLET | Freq: Every day | ORAL | 3 refills | Status: AC
  Filled 2023-04-15: qty 90, 90d supply, fill #0
  Filled 2023-08-09 – 2023-08-30 (×2): qty 90, 90d supply, fill #1
  Filled 2023-11-24: qty 90, 90d supply, fill #2
  Filled 2024-03-08: qty 90, 90d supply, fill #3

## 2023-04-15 MED ORDER — BUPROPION HCL ER (XL) 150 MG PO TB24
ORAL_TABLET | Freq: Every day | ORAL | 3 refills | Status: AC
  Filled 2023-04-15: qty 90, 90d supply, fill #0
  Filled 2023-08-09 – 2023-08-30 (×2): qty 90, 90d supply, fill #1
  Filled 2023-11-24: qty 90, 90d supply, fill #2
  Filled 2024-03-08: qty 90, 90d supply, fill #3

## 2023-04-16 ENCOUNTER — Other Ambulatory Visit (HOSPITAL_COMMUNITY): Payer: Self-pay

## 2023-04-21 ENCOUNTER — Telehealth: Payer: Self-pay

## 2023-04-21 NOTE — Telephone Encounter (Signed)
Called again and was forwarded to voicemail, where another message was left that I was trying to reach her for her pre visit

## 2023-04-21 NOTE — Telephone Encounter (Signed)
Called patient for pre visit appointment.  Message was left that I was trying to reach her for the PV.  Will try back in 5 min

## 2023-04-27 ENCOUNTER — Ambulatory Visit (AMBULATORY_SURGERY_CENTER): Payer: Commercial Managed Care - PPO

## 2023-04-27 ENCOUNTER — Other Ambulatory Visit (HOSPITAL_COMMUNITY): Payer: Self-pay

## 2023-04-27 ENCOUNTER — Other Ambulatory Visit: Payer: Self-pay

## 2023-04-27 VITALS — Ht 67.0 in | Wt 260.0 lb

## 2023-04-27 DIAGNOSIS — Z1211 Encounter for screening for malignant neoplasm of colon: Secondary | ICD-10-CM

## 2023-04-27 MED ORDER — NA SULFATE-K SULFATE-MG SULF 17.5-3.13-1.6 GM/177ML PO SOLN
1.0000 | Freq: Once | ORAL | 0 refills | Status: AC
  Filled 2023-04-27: qty 354, 1d supply, fill #0

## 2023-04-27 NOTE — Progress Notes (Signed)

## 2023-05-02 ENCOUNTER — Encounter: Payer: Self-pay | Admitting: Family Medicine

## 2023-05-04 ENCOUNTER — Ambulatory Visit: Payer: Commercial Managed Care - PPO | Admitting: Family Medicine

## 2023-05-04 ENCOUNTER — Encounter: Payer: Self-pay | Admitting: Family Medicine

## 2023-05-04 VITALS — BP 130/84 | HR 67 | Temp 97.9°F | Ht 67.0 in | Wt 263.0 lb

## 2023-05-04 DIAGNOSIS — E7800 Pure hypercholesterolemia, unspecified: Secondary | ICD-10-CM | POA: Diagnosis not present

## 2023-05-04 DIAGNOSIS — Z131 Encounter for screening for diabetes mellitus: Secondary | ICD-10-CM | POA: Diagnosis not present

## 2023-05-04 DIAGNOSIS — G4733 Obstructive sleep apnea (adult) (pediatric): Secondary | ICD-10-CM

## 2023-05-04 DIAGNOSIS — I10 Essential (primary) hypertension: Secondary | ICD-10-CM | POA: Diagnosis not present

## 2023-05-04 DIAGNOSIS — E66813 Obesity, class 3: Secondary | ICD-10-CM

## 2023-05-04 DIAGNOSIS — Z6841 Body Mass Index (BMI) 40.0 and over, adult: Secondary | ICD-10-CM

## 2023-05-04 MED ORDER — TIRZEPATIDE-WEIGHT MANAGEMENT 2.5 MG/0.5ML ~~LOC~~ SOLN
2.5000 mg | SUBCUTANEOUS | 2 refills | Status: DC
Start: 2023-05-04 — End: 2023-06-22

## 2023-05-04 MED ORDER — TIRZEPATIDE-WEIGHT MANAGEMENT 2.5 MG/0.5ML ~~LOC~~ SOLN: 2.5000 mg | SUBCUTANEOUS | 2 refills | Status: DC

## 2023-05-04 NOTE — Progress Notes (Signed)
Established Patient Office Visit   Subjective:  Patient ID: Angela Stark, female    DOB: 05/19/1970  Age: 53 y.o. MRN: [id number]  No chief complaint on file.   HPI Encounter Diagnoses  Name Primary?   Class 3 severe obesity due to excess calories with body mass index (BMI) of 40.0 to 44.9 in adult, unspecified whether serious comorbidity present (HCC) Yes   Obstructive sleep apnea syndrome    Here for prescription of Zepbound and to have her health maintenance labs drawn.  She is working directly with [employer] we will give her a discount on Zepbound for self-pay patients.  She had taken semaglutide for a year without issue and had lost 35 pounds.  She is also trying to lose weight so that she can have her right knee replaced.  She continues in sobriety.  Scheduled for colonoscopy later this month.  Seeing cardiology for palpitations.   Review of Systems  Constitutional: Negative.   HENT: Negative.    Eyes:  Negative for blurred vision, discharge and redness.  Respiratory: Negative.  Negative for shortness of breath.   Cardiovascular:  Positive for palpitations. Negative for chest pain.  Gastrointestinal:  Negative for abdominal pain.  Genitourinary: Negative.   Musculoskeletal:  Positive for joint pain (right knee). Negative for myalgias.  Skin:  Negative for rash.  Neurological:  Negative for tingling, loss of consciousness and weakness.  Endo/Heme/Allergies:  Negative for polydipsia.     Current Outpatient Medications:    amLODipine (NORVASC) 10 MG tablet, Take 1 tablet (10 mg total) by mouth daily., Disp: 90 tablet, Rfl: 4   atorvastatin (LIPITOR) 20 MG tablet, Take 1 tablet (20 mg total) by mouth daily., Disp: 90 tablet, Rfl: 3   buPROPion (WELLBUTRIN XL) 150 MG 24 hr tablet, TAKE 1 TABLET BY MOUTH DAILY., Disp: 90 tablet, Rfl: 3   chlorthalidone (HYGROTON) 25 MG tablet, Take 1 tablet (25 mg total) by mouth daily., Disp: 100 tablet, Rfl: 3   Multiple  Vitamin (MULTIVITAMIN WITH MINERALS) TABS tablet, Take 1 tablet by mouth daily., Disp: , Rfl:    naproxen sodium (ALEVE) 220 MG tablet, Take 220 mg by mouth., Disp: , Rfl:    potassium chloride (KLOR-CON) 20 MEQ packet, Take by mouth 2 (two) times daily., Disp: , Rfl:    tirzepatide (ZEPBOUND) 2.5 MG/0.5ML injection vial, Inject 2.5 mg into the skin once a week., Disp: 2 mL, Rfl: 2   Objective:     BP 130/84   Pulse 67   Temp 97.9 F (36.6 C)   Ht 5\' 7"  (1.702 m)   Wt 263 lb (119.3 kg)   LMP 07/30/2015 (Approximate)   SpO2 98%   BMI 41.19 kg/m    Physical Exam Constitutional:      General: She is not in acute distress.    Appearance: Normal appearance. She is obese. She is not ill-appearing, toxic-appearing or diaphoretic.  HENT:     Head: Normocephalic and atraumatic.     Right Ear: External ear normal.     Left Ear: External ear normal.  Eyes:     General: No scleral icterus.       Right eye: No discharge.        Left eye: No discharge.     Extraocular Movements: Extraocular movements intact.     Conjunctiva/sclera: Conjunctivae normal.  Pulmonary:     Effort: Pulmonary effort is normal. No respiratory distress.  Skin:    General: Skin is warm and dry.  Neurological:     Mental Status: She is alert and oriented to person, place, and time.  Psychiatric:        Mood and Affect: Mood normal.        Behavior: Behavior normal.      No results found for any visits on 05/04/23.    The 10-year ASCVD risk score (Arnett DK, et al., 2019) is: 3.3%    Assessment & Plan:   Class 3 severe obesity due to excess calories with body mass index (BMI) of 40.0 to 44.9 in adult, unspecified whether serious comorbidity present [facility]) -     Tirzepatide-Weight Management; Inject 2.5 mg into the skin once a week.  Dispense: 2 mL; Refill: 2  Obstructive sleep apnea syndrome -     Tirzepatide-Weight Management; Inject 2.5 mg into the skin once a week.  Dispense: 2 mL; Refill:  2    Return in about 3 months (around 08/01/2023).  Prescription for Zepbound or 2.5 mg vials sent directly to [facility].  They will send her needed syringes.  Discussed common side effects including nausea, constipation and/or diarrhea.  Discussed precautions with severe abdominal pain possibly indicating pancreatitis or intestinal obstruction would need emergency evaluation.  She is having her health maintenance labs drawn today.  Mliss Sax, MD

## 2023-05-05 ENCOUNTER — Ambulatory Visit
Admission: RE | Admit: 2023-05-05 | Discharge: 2023-05-05 | Disposition: A | Discharge status: Home / Self Care | Payer: Commercial Managed Care - PPO | Source: Ambulatory Visit | Attending: Obstetrics and Gynecology | Admitting: Obstetrics and Gynecology

## 2023-05-05 DIAGNOSIS — Z87891 Personal history of nicotine dependence: Secondary | ICD-10-CM

## 2023-05-05 LAB — COMPREHENSIVE METABOLIC PANEL
ALT: 17 U/L (ref 0–35)
AST: 20 U/L (ref 0–37)
Albumin: 4.4 g/dL (ref 3.5–5.2)
Alkaline Phosphatase: 54 U/L (ref 39–117)
BUN: 14 mg/dL (ref 6–23)
CO2: 33 meq/L — ABNORMAL HIGH (ref 19–32)
Calcium: 9.4 mg/dL (ref 8.4–10.5)
Chloride: 98 meq/L (ref 96–112)
Creatinine, Ser: 0.51 mg/dL (ref 0.40–1.20)
GFR: 107.19 mL/min (ref >60.00)
Glucose, Bld: 86 mg/dL (ref 70–99)
Potassium: 3.5 meq/L (ref 3.5–5.1)
Sodium: 140 meq/L (ref 135–145)
Total Bilirubin: 0.5 mg/dL (ref 0.2–1.2)
Total Protein: 6.9 g/dL (ref 6.0–8.3)

## 2023-05-05 LAB — URINALYSIS, ROUTINE W REFLEX MICROSCOPIC
Bilirubin Urine: NEGATIVE
Hgb urine dipstick: NEGATIVE
Ketones, ur: NEGATIVE
Leukocytes,Ua: NEGATIVE
Nitrite: NEGATIVE
RBC / HPF: NONE SEEN (ref 0–?)
Specific Gravity, Urine: <=1.005 — AB (ref 1.000–1.030)
Total Protein, Urine: NEGATIVE
Urine Glucose: NEGATIVE
Urobilinogen, UA: 0.2 (ref 0.0–1.0)
WBC, UA: NONE SEEN (ref 0–?)
pH: 6.5 (ref 5.0–8.0)

## 2023-05-05 LAB — CBC WITH DIFFERENTIAL/PLATELET
Basophils Absolute: 0.1 10*3/uL (ref 0.0–0.1)
Basophils Relative: 2 % (ref 0.0–3.0)
Eosinophils Absolute: 0.1 10*3/uL (ref 0.0–0.7)
Eosinophils Relative: 1.3 % (ref 0.0–5.0)
HCT: 43.7 % (ref 36.0–46.0)
Hemoglobin: 14.6 g/dL (ref 12.0–15.0)
Lymphocytes Relative: 31.1 % (ref 12.0–46.0)
Lymphs Abs: 2.2 10*3/uL (ref 0.7–4.0)
MCHC: 33.4 g/dL (ref 30.0–36.0)
MCV: 93.7 fL (ref 78.0–100.0)
Monocytes Absolute: 0.5 10*3/uL (ref 0.1–1.0)
Monocytes Relative: 6.5 % (ref 3.0–12.0)
Neutro Abs: 4.1 10*3/uL (ref 1.4–7.7)
Neutrophils Relative %: 59.1 % (ref 43.0–77.0)
Platelets: 228 10*3/uL (ref 150.0–400.0)
RBC: 4.66 Mil/uL (ref 3.87–5.11)
RDW: 14.3 % (ref 11.5–15.5)
WBC: 7 10*3/uL (ref 4.0–10.5)

## 2023-05-05 LAB — LIPID PANEL
Cholesterol: 202 mg/dL — ABNORMAL HIGH (ref 0–200)
HDL: 62.3 mg/dL (ref >39.00)
LDL Cholesterol: 92 mg/dL (ref 0–99)
NonHDL: 139.88
Total CHOL/HDL Ratio: 3
Triglycerides: 238 mg/dL — ABNORMAL HIGH (ref 0.0–149.0)
VLDL: 47.6 mg/dL — ABNORMAL HIGH (ref 0.0–40.0)

## 2023-05-05 LAB — HEMOGLOBIN A1C: Hgb A1c MFr Bld: 5.6 % (ref 4.6–6.5)

## 2023-05-06 ENCOUNTER — Encounter: Payer: Self-pay | Admitting: Family Medicine

## 2023-05-10 ENCOUNTER — Encounter: Payer: Self-pay | Admitting: Cardiology

## 2023-05-10 DIAGNOSIS — E785 Hyperlipidemia, unspecified: Secondary | ICD-10-CM | POA: Insufficient documentation

## 2023-05-10 DIAGNOSIS — G479 Sleep disorder, unspecified: Secondary | ICD-10-CM | POA: Insufficient documentation

## 2023-05-10 DIAGNOSIS — G510 Bell's palsy: Secondary | ICD-10-CM | POA: Insufficient documentation

## 2023-05-10 DIAGNOSIS — M1990 Unspecified osteoarthritis, unspecified site: Secondary | ICD-10-CM | POA: Insufficient documentation

## 2023-05-10 DIAGNOSIS — D649 Anemia, unspecified: Secondary | ICD-10-CM | POA: Insufficient documentation

## 2023-05-10 DIAGNOSIS — M549 Dorsalgia, unspecified: Secondary | ICD-10-CM | POA: Insufficient documentation

## 2023-05-10 DIAGNOSIS — G4733 Obstructive sleep apnea (adult) (pediatric): Secondary | ICD-10-CM

## 2023-05-10 DIAGNOSIS — M2550 Pain in unspecified joint: Secondary | ICD-10-CM | POA: Insufficient documentation

## 2023-05-10 DIAGNOSIS — M6289 Other specified disorders of muscle: Secondary | ICD-10-CM | POA: Insufficient documentation

## 2023-05-10 DIAGNOSIS — F439 Reaction to severe stress, unspecified: Secondary | ICD-10-CM | POA: Insufficient documentation

## 2023-05-10 DIAGNOSIS — M7989 Other specified soft tissue disorders: Secondary | ICD-10-CM | POA: Insufficient documentation

## 2023-05-10 DIAGNOSIS — K219 Gastro-esophageal reflux disease without esophagitis: Secondary | ICD-10-CM | POA: Insufficient documentation

## 2023-05-10 HISTORY — DX: Bell's palsy: G51.0

## 2023-05-10 HISTORY — DX: Obstructive sleep apnea (adult) (pediatric): G47.33

## 2023-05-12 ENCOUNTER — Ambulatory Visit: Payer: Commercial Managed Care - PPO | Admitting: Cardiology

## 2023-05-12 DIAGNOSIS — M1990 Unspecified osteoarthritis, unspecified site: Secondary | ICD-10-CM

## 2023-05-12 DIAGNOSIS — M7989 Other specified soft tissue disorders: Secondary | ICD-10-CM

## 2023-05-12 DIAGNOSIS — F439 Reaction to severe stress, unspecified: Secondary | ICD-10-CM

## 2023-05-12 DIAGNOSIS — G479 Sleep disorder, unspecified: Secondary | ICD-10-CM

## 2023-05-12 DIAGNOSIS — M6289 Other specified disorders of muscle: Secondary | ICD-10-CM

## 2023-05-17 ENCOUNTER — Ambulatory Visit: Payer: Commercial Managed Care - PPO | Admitting: Physician Assistant

## 2023-05-17 ENCOUNTER — Encounter: Payer: Self-pay | Admitting: Physician Assistant

## 2023-05-17 VITALS — Ht 65.75 in | Wt 253.4 lb

## 2023-05-17 DIAGNOSIS — M1711 Unilateral primary osteoarthritis, right knee: Secondary | ICD-10-CM

## 2023-05-17 DIAGNOSIS — M1712 Unilateral primary osteoarthritis, left knee: Secondary | ICD-10-CM

## 2023-05-17 DIAGNOSIS — M170 Bilateral primary osteoarthritis of knee: Secondary | ICD-10-CM | POA: Diagnosis not present

## 2023-05-17 MED ORDER — LIDOCAINE HCL 1 % IJ SOLN
4.0000 mL | INTRAMUSCULAR | Status: AC | PRN
  Administered 2023-05-17: 4 mL

## 2023-05-17 MED ORDER — METHYLPREDNISOLONE ACETATE 40 MG/ML IJ SUSP
40.0000 mg | INTRAMUSCULAR | Status: AC | PRN
  Administered 2023-05-17: 40 mg via INTRA_ARTICULAR

## 2023-05-17 NOTE — Progress Notes (Signed)
   Procedure Note  Patient: Angela Stark             Date of Birth: 02/22/1971           MRN: [id number]             Visit Date: 05/17/2023  HPI: [name] comes in today due to bilateral knee pain.  She is wanting both knees aspirated and injected.  Last aspiration injections were performed 11/03/2022.  She states that these were helpful.  She is also here for height and weight check.  She continues to work on weight loss.  She has had no injuries to either knee.  Denies any fevers chills.  Physical exam: Height 167 cm weight 253.4 pounds BMI 41.21 kg/m General Well-developed well-nourished female no acute distress ambulates without any assistive device. Psych: Alert and oriented x 3 Respirations unlabored: Bilateral knees: Good range of motion of both knees no abnormal warmth or erythema.  Effusion bilateral knees.  Patellofemoral crepitus in both knees.  Procedures: Visit Diagnoses:  1. Unilateral primary osteoarthritis, right knee   2. Unilateral primary osteoarthritis, left knee     Large Joint Inj: bilateral knee on 05/17/2023 4:06 PM Indications: pain Details: 22 G 1.5 in needle, anterolateral approach  Arthrogram: No  Medications (Right): 4 mL lidocaine 1 %; 40 mg methylPREDNISolone acetate 40 MG/ML Aspirate (Right): 55 mL yellow and blood-tinged Medications (Left): 4 mL lidocaine 1 %; 40 mg methylPREDNISolone acetate 40 MG/ML Aspirate (Left): 15 mL yellow Outcome: tolerated well, no immediate complications Procedure, treatment alternatives, risks and benefits explained, specific risks discussed. Consent was given by the patient. Immediately prior to procedure a time out was called to verify the correct patient, procedure, equipment, support staff and site/side marked as required. Patient was prepped and draped in the usual sterile fashion.    Plan: Will see her back in 3 months for height and weight check.  Also possible aspiration injections.  She tolerated the  aspiration injections well today

## 2023-05-19 ENCOUNTER — Encounter: Payer: Self-pay | Admitting: Family Medicine

## 2023-05-19 ENCOUNTER — Encounter: Payer: Self-pay | Admitting: Internal Medicine

## 2023-05-21 ENCOUNTER — Ambulatory Visit (AMBULATORY_SURGERY_CENTER): Payer: 59 | Admitting: Internal Medicine

## 2023-05-21 ENCOUNTER — Encounter: Payer: Self-pay | Admitting: Internal Medicine

## 2023-05-21 VITALS — BP 131/71 | HR 47 | Temp 97.9°F | Resp 13 | Ht 66.0 in | Wt 260.0 lb

## 2023-05-21 DIAGNOSIS — I10 Essential (primary) hypertension: Secondary | ICD-10-CM | POA: Diagnosis not present

## 2023-05-21 DIAGNOSIS — F418 Other specified anxiety disorders: Secondary | ICD-10-CM | POA: Diagnosis not present

## 2023-05-21 DIAGNOSIS — K760 Fatty (change of) liver, not elsewhere classified: Secondary | ICD-10-CM | POA: Diagnosis not present

## 2023-05-21 DIAGNOSIS — D128 Benign neoplasm of rectum: Secondary | ICD-10-CM | POA: Diagnosis not present

## 2023-05-21 DIAGNOSIS — D125 Benign neoplasm of sigmoid colon: Secondary | ICD-10-CM

## 2023-05-21 DIAGNOSIS — Z1211 Encounter for screening for malignant neoplasm of colon: Secondary | ICD-10-CM | POA: Diagnosis not present

## 2023-05-21 DIAGNOSIS — K5730 Diverticulosis of large intestine without perforation or abscess without bleeding: Secondary | ICD-10-CM | POA: Diagnosis not present

## 2023-05-21 DIAGNOSIS — K648 Other hemorrhoids: Secondary | ICD-10-CM | POA: Diagnosis not present

## 2023-05-21 DIAGNOSIS — K635 Polyp of colon: Secondary | ICD-10-CM

## 2023-05-21 DIAGNOSIS — G4733 Obstructive sleep apnea (adult) (pediatric): Secondary | ICD-10-CM | POA: Diagnosis not present

## 2023-05-21 DIAGNOSIS — D127 Benign neoplasm of rectosigmoid junction: Secondary | ICD-10-CM | POA: Diagnosis not present

## 2023-05-21 DIAGNOSIS — D123 Benign neoplasm of transverse colon: Secondary | ICD-10-CM

## 2023-05-21 DIAGNOSIS — D120 Benign neoplasm of cecum: Secondary | ICD-10-CM

## 2023-05-21 MED ORDER — SODIUM CHLORIDE 0.9 % IV SOLN: 500.0000 mL | Freq: Once | INTRAVENOUS | Status: AC

## 2023-05-21 NOTE — Progress Notes (Signed)
 Called to room to assist during endoscopic procedure.  Patient ID and intended procedure confirmed with present staff. Received instructions for my participation in the procedure from the performing physician.

## 2023-05-21 NOTE — Op Note (Signed)
 [facility] Patient Name: Angela Stark Procedure Date: 05/21/2023 1:23 PM MRN: [id number] Endoscopist: Madelyn Brunner [place] , , [id number] Age: 53 Referring MD:  Date of Birth: 1970-09-22 Gender: Female Account #: [id number] Procedure:                Colonoscopy Indications:              Screening for colorectal malignant neoplasm, This                            is the patient's first colonoscopy Medicines:                Monitored Anesthesia Care Procedure:                Pre-Anesthesia Assessment:                           - Prior to the procedure, a History and Physical                            was performed, and patient medications and                            allergies were reviewed. The patient's tolerance of                            previous anesthesia was also reviewed. The risks                            and benefits of the procedure and the sedation                            options and risks were discussed with the patient.                            All questions were answered, and informed consent                            was obtained. Prior Anticoagulants: The patient has                            taken no anticoagulant or antiplatelet agents. ASA                            Grade Assessment: III - A patient with severe                            systemic disease. After reviewing the risks and                            benefits, the patient was deemed in satisfactory                            condition to undergo the procedure.  After obtaining informed consent, the colonoscope                            was passed under direct vision. Throughout the                            procedure, the patient's blood pressure, pulse, and                            oxygen saturations were monitored continuously. The                            Olympus Scope SN [id number] was introduced through the                            anus and  advanced to the the terminal ileum. The                            colonoscopy was performed without difficulty. The                            patient tolerated the procedure well. The quality                            of the bowel preparation was good. The terminal                            ileum, ileocecal valve, appendiceal orifice, and                            rectum were photographed. Scope In: 1:38:08 PM Scope Out: 2:07:42 PM Scope Withdrawal Time: 0 hours 24 minutes 49 seconds  Total Procedure Duration: 0 hours 29 minutes 34 seconds  Findings:                 The terminal ileum appeared normal.                           Five sessile polyps were found in the transverse                            colon and cecum. The polyps were 3 to 10 mm in                            size. These polyps were removed with a cold snare.                            Resection and retrieval were complete.                           Four sessile polyps were found in the rectum and                            sigmoid colon. The polyps  were 3 to 7 mm in size.                            These polyps were removed with a cold snare.                            Resection and retrieval were complete.                           Multiple diverticula were found in the sigmoid                            colon.                           Non-bleeding internal hemorrhoids were found during                            retroflexion. Complications:            No immediate complications. Estimated Blood Loss:     Estimated blood loss was minimal. Impression:               - The examined portion of the ileum was normal.                           - Five 3 to 10 mm polyps in the transverse colon                            and in the cecum, removed with a cold snare.                            Resected and retrieved.                           - Four 3 to 7 mm polyps in the rectum and in the                            sigmoid  colon, removed with a cold snare. Resected                            and retrieved.                           - Diverticulosis in the sigmoid colon.                           - Non-bleeding internal hemorrhoids. Recommendation:           - Discharge patient to home (with escort).                           - Await pathology results.                           - The findings and recommendations were discussed  with the patient. [name] [place],  05/21/2023 2:14:36 PM

## 2023-05-21 NOTE — Progress Notes (Signed)
 Vitals-DT  Pt's states no medical or surgical changes since previsit or office visit.

## 2023-05-21 NOTE — Progress Notes (Signed)
 To pacu, VSS. Report to RN.tb

## 2023-05-21 NOTE — Patient Instructions (Signed)
 Await pathology Continue your normal medications Please read over handouts provided   YOU HAD AN ENDOSCOPIC PROCEDURE TODAY AT THE [facility]:   Refer to the procedure report that was given to you for any specific questions about what was found during the examination.  If the procedure report does not answer your questions, please call your gastroenterologist to clarify.  If you requested that your care partner not be given the details of your procedure findings, then the procedure report has been included in a sealed envelope for you to review at your convenience later.  YOU SHOULD EXPECT: Some feelings of bloating in the abdomen. Passage of more gas than usual.  Walking can help get rid of the air that was put into your GI tract during the procedure and reduce the bloating. If you had a lower endoscopy (such as a colonoscopy or flexible sigmoidoscopy) you may notice spotting of blood in your stool or on the toilet paper. If you underwent a bowel prep for your procedure, you may not have a normal bowel movement for a few days.  Please Note:  You might notice some irritation and congestion in your nose or some drainage.  This is from the oxygen used during your procedure.  There is no need for concern and it should clear up in a day or so.  SYMPTOMS TO REPORT IMMEDIATELY:  Following lower endoscopy (colonoscopy or flexible sigmoidoscopy):  Excessive amounts of blood in the stool  Significant tenderness or worsening of abdominal pains  Swelling of the abdomen that is new, acute  Fever of 100F or higher  For urgent or emergent issues, a gastroenterologist can be reached at any hour by calling [phone]. Do not use MyChart messaging for urgent concerns.    DIET:  We do recommend a small meal at first, but then you may proceed to your regular diet.  Drink plenty of fluids but you should avoid alcoholic beverages for 24 hours.  ACTIVITY:  You should plan to take it easy for  the rest of today and you should NOT DRIVE or use heavy machinery until tomorrow (because of the sedation medicines used during the test).    FOLLOW UP: Our staff will call the number listed on your records the next business day following your procedure.  We will call around 7:15- 8:00 am to check on you and address any questions or concerns that you may have regarding the information given to you following your procedure. If we do not reach you, we will leave a message.     If any biopsies were taken you will be contacted by phone or by letter within the next 1-3 weeks.  Please call us at [phone] if you have not heard about the biopsies in 3 weeks.    SIGNATURES/CONFIDENTIALITY: You and/or your care partner have signed paperwork which will be entered into your electronic medical record.  These signatures attest to the fact that that the information above on your After Visit Summary has been reviewed and is understood.  Full responsibility of the confidentiality of this discharge information lies with you and/or your care-partner.

## 2023-05-21 NOTE — Progress Notes (Signed)
 GASTROENTEROLOGY PROCEDURE H&P NOTE   Primary Care Physician: Mliss Sax, MD    Reason for Procedure:   Colon cancer screening  Plan:    Colonoscopy  Patient is appropriate for endoscopic procedure(s) in the ambulatory ([facility]) setting.  The nature of the procedure, as well as the risks, benefits, and alternatives were carefully and thoroughly reviewed with the patient. Ample time for discussion and questions allowed. The patient understood, was satisfied, and agreed to proceed.     HPI: LOLITA Stark is a 53 y.o. female who presents for colonoscopy for colon cancer screening. Denies blood in stools, changes in bowel habits, or unintentional weight loss. Denies family history of colon cancer. This is her first colonoscopy.   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   Amenorrhea 05/28/2017   Anemia    Anxiety    Anxiety and depression 07/26/2006   Qualifier: Diagnosis of   By: Ardyth Man         Apnea 05/28/2017   ARF (acute renal failure) (HCC) 08/18/2011   Arthritis    Back pain    Bell's palsy 05/10/2023   Class 3 severe obesity due to excess calories with body mass index (BMI) of 40.0 to 44.9 in adult (HCC) 08/27/2016   Dehydration 08/18/2011   Depression    Essential hypertension 07/30/2015   Fatty liver    GERD (gastroesophageal reflux disease)    Heart murmur    When pregnant   Hyperlipidemia    Hypertension    Hypokalemia 08/18/2011   Hyponatremia 08/18/2011   Joint pain    Knee pain    Morbid obesity with BMI of 40.0-44.9, adult (HCC) 03/22/2023   Muscle stiffness    Need for shingles vaccine 11/10/2021   Obstructive sleep apnea (adult) (pediatric) 05/10/2023   Other fatigue 10/18/2017   Palpitations 03/22/2023   Patellofemoral arthritis of left knee 09/05/2015   Phlebitis    Polysubstance  dependence including opioid type drug, episodic abuse (HCC) 08/18/2011   Primary osteoarthritis of both knees 01/12/2018   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   Right ankle pain 09/05/2015   Right knee pain 10/19/2017   Right upper quadrant pain 07/30/2015   Screening for diabetes mellitus 11/30/2014   Shortness of breath    Shortness of breath on exertion    Sleep apnea    Sleep apnea    Steatosis of liver 09/06/2017   Stress    Swelling of both lower extremities    Thrombocytopenia (HCC) 08/18/2011   Thyroid nodule 08/09/2020   Tobacco use disorder 04/16/2015   Chronic      Trouble in sleeping    Vision disturbance 04/16/2015   1/17 ?etiology     Visual disturbance 07/30/2015    Past Surgical History:  Procedure Laterality Date   OVARY SURGERY      Prior to Admission medications   Medication Sig Start Date End Date Taking? Authorizing Provider  amLODipine (NORVASC) 10 MG tablet Take 1 tablet (10 mg total) by mouth daily. 03/03/22  Yes Mliss Sax, MD  atorvastatin (LIPITOR) 20 MG tablet Take 1 tablet (20 mg total) by mouth daily. 04/15/23  Yes Mliss Sax, MD  buPROPion (WELLBUTRIN XL) 150 MG 24 hr tablet TAKE 1 TABLET BY MOUTH DAILY. 04/15/23 04/14/24 Yes Mliss Sax, MD  chlorthalidone (HYGROTON) 25 MG tablet Take 1  tablet (25 mg total) by mouth daily. 04/02/22  Yes Mliss Sax, MD  Multiple Vitamin (MULTIVITAMIN WITH MINERALS) TABS tablet Take 1 tablet by mouth daily.   Yes [provider]  potassium chloride (KLOR-CON) 20 MEQ packet Take by mouth 2 (two) times daily.   Yes [provider]  naproxen sodium (ALEVE) 220 MG tablet Take 220 mg by mouth.    [provider]  tirzepatide (ZEPBOUND) 2.5 MG/0.5ML injection vial Inject 2.5 mg into the skin once a week. 05/04/23   Mliss Sax, MD    Current Outpatient Medications  Medication Sig Dispense Refill   amLODipine (NORVASC) 10 MG tablet Take  1 tablet (10 mg total) by mouth daily. 90 tablet 4   atorvastatin (LIPITOR) 20 MG tablet Take 1 tablet (20 mg total) by mouth daily. 90 tablet 3   buPROPion (WELLBUTRIN XL) 150 MG 24 hr tablet TAKE 1 TABLET BY MOUTH DAILY. 90 tablet 3   chlorthalidone (HYGROTON) 25 MG tablet Take 1 tablet (25 mg total) by mouth daily. 100 tablet 3   Multiple Vitamin (MULTIVITAMIN WITH MINERALS) TABS tablet Take 1 tablet by mouth daily.     potassium chloride (KLOR-CON) 20 MEQ packet Take by mouth 2 (two) times daily.     naproxen sodium (ALEVE) 220 MG tablet Take 220 mg by mouth.     tirzepatide (ZEPBOUND) 2.5 MG/0.5ML injection vial Inject 2.5 mg into the skin once a week. 2 mL 2   Current Facility-Administered Medications  Medication Dose Route Frequency Provider Last Rate Last Admin   0.9 %  sodium chloride infusion  500 mL Intravenous Once Imogene Burn, MD        Allergies as of 05/21/2023   (No Known Allergies)    Family History  Problem Relation Age of Onset   Hypertension Mother    Hyperlipidemia Mother    Diabetes Mother    Emphysema Mother    Obesity Mother    Prostate cancer Father    Heart disease Father    Hypertension Father    Hyperlipidemia Father    Obesity Father    Sleep apnea Father    Alcoholism Father    Stroke Brother    Lung cancer Maternal Grandmother    Breast cancer Neg Hx    Colon cancer Neg Hx    Colon polyps Neg Hx    Esophageal cancer Neg Hx    Rectal cancer Neg Hx    Stomach cancer Neg Hx     Social History   Socioeconomic History   Marital status: Married    Spouse name: Onalee Hua   Number of children: 3   Years of education: 14   Highest education level: Not on file  Occupational History   Occupation: Scientist, research (medical): [employer]  Tobacco Use   Smoking status: Former    Current packs/day: 0.00    Average packs/day: 0.5 packs/day for 20.0 years (10.0 ttl pk-yrs)    Types: Cigarettes    Start date: 01/29/2002    Quit date: 01/29/2022    Years  since quitting: 1.3   Smokeless tobacco: Never  Vaping Use   Vaping status: Every Day  Substance and Sexual Activity   Alcohol use: No   Drug use: No   Sexual activity: Yes    Birth control/protection: None  Other Topics Concern   Not on file  Social History Narrative   Fun: Fishing, camping   Denies religious beliefs effecting health care.  Social Drivers of Corporate investment banker Strain: Not on file  Food Insecurity: Not on file  Transportation Needs: Not on file  Physical Activity: Not on file  Stress: Not on file  Social Connections: Not on file  Intimate Partner Violence: Not on file    Physical Exam: Vital signs in last 24 hours: BP 133/70 (BP Location: Right Arm, Patient Position: Sitting, Cuff Size: Normal)   Pulse (!) 57   Temp 97.9 F (36.6 C) (Temporal)   Resp 16   Ht 5\' 6"  (1.676 m)   Wt 260 lb (117.9 kg)   LMP 07/30/2015 (Approximate)   SpO2 100%   BMI 41.97 kg/m  GEN: NAD EYE: Sclerae anicteric ENT: MMM CV: Non-tachycardic Pulm: No increased work of breathing GI: Soft, NT/ND NEURO:  Alert & Oriented   Eulah Pont, MD [facility]  05/21/2023 1:34 PM

## 2023-05-24 ENCOUNTER — Telehealth: Payer: Self-pay

## 2023-05-24 NOTE — Telephone Encounter (Signed)
  Follow up Call-     05/21/2023   12:34 PM 05/21/2023   12:29 PM  Call back number  Post procedure Call Back phone  # [phone]   Permission to leave phone message  Yes    Attempted to call patient regarding follow-up. No answer, VM left.

## 2023-05-25 ENCOUNTER — Ambulatory Visit: Payer: Commercial Managed Care - PPO | Admitting: Family Medicine

## 2023-05-26 LAB — SURGICAL PATHOLOGY

## 2023-06-01 ENCOUNTER — Encounter: Payer: Self-pay | Admitting: Internal Medicine

## 2023-06-14 ENCOUNTER — Other Ambulatory Visit: Payer: Self-pay | Admitting: Family Medicine

## 2023-06-14 ENCOUNTER — Encounter: Payer: Self-pay | Admitting: Family Medicine

## 2023-06-14 DIAGNOSIS — I10 Essential (primary) hypertension: Secondary | ICD-10-CM

## 2023-06-16 ENCOUNTER — Other Ambulatory Visit (HOSPITAL_COMMUNITY): Payer: Self-pay

## 2023-06-16 ENCOUNTER — Other Ambulatory Visit: Payer: Self-pay

## 2023-06-16 ENCOUNTER — Other Ambulatory Visit: Payer: Self-pay | Admitting: Family Medicine

## 2023-06-16 DIAGNOSIS — I10 Essential (primary) hypertension: Secondary | ICD-10-CM

## 2023-06-16 MED ORDER — AMLODIPINE BESYLATE 10 MG PO TABS
10.0000 mg | ORAL_TABLET | Freq: Every day | ORAL | 4 refills | Status: AC
  Filled 2023-06-16: qty 90, 90d supply, fill #0
  Filled 2023-10-11: qty 90, 90d supply, fill #1
  Filled 2023-11-24 – 2024-03-08 (×2): qty 90, 90d supply, fill #2

## 2023-06-22 ENCOUNTER — Other Ambulatory Visit: Payer: Self-pay | Admitting: Family

## 2023-06-22 ENCOUNTER — Other Ambulatory Visit: Payer: Self-pay

## 2023-06-22 ENCOUNTER — Other Ambulatory Visit (HOSPITAL_COMMUNITY): Payer: Self-pay

## 2023-06-22 MED ORDER — TIRZEPATIDE-WEIGHT MANAGEMENT 5 MG/0.5ML ~~LOC~~ SOLN
5.0000 mg | SUBCUTANEOUS | 1 refills | Status: DC
  Filled 2023-06-22: qty 2, 28d supply, fill #0

## 2023-07-01 ENCOUNTER — Other Ambulatory Visit: Payer: Self-pay

## 2023-07-01 MED ORDER — TIRZEPATIDE-WEIGHT MANAGEMENT 5 MG/0.5ML ~~LOC~~ SOLN: 5.0000 mg | SUBCUTANEOUS | 1 refills | Status: DC

## 2023-07-06 ENCOUNTER — Encounter: Payer: Self-pay | Admitting: Family Medicine

## 2023-08-09 ENCOUNTER — Other Ambulatory Visit (HOSPITAL_COMMUNITY): Payer: Self-pay

## 2023-08-09 ENCOUNTER — Other Ambulatory Visit: Payer: Self-pay | Admitting: Family Medicine

## 2023-08-09 DIAGNOSIS — I10 Essential (primary) hypertension: Secondary | ICD-10-CM

## 2023-08-09 MED ORDER — CHLORTHALIDONE 25 MG PO TABS
25.0000 mg | ORAL_TABLET | Freq: Every day | ORAL | 0 refills | Status: DC
  Filled 2023-08-09 – 2023-08-30 (×2): qty 90, 90d supply, fill #0
  Filled 2023-11-24: qty 10, 10d supply, fill #1

## 2023-08-10 ENCOUNTER — Other Ambulatory Visit: Payer: Self-pay

## 2023-08-13 ENCOUNTER — Other Ambulatory Visit: Payer: Self-pay

## 2023-08-19 ENCOUNTER — Ambulatory Visit: Payer: Commercial Managed Care - PPO | Admitting: Physician Assistant

## 2023-08-19 ENCOUNTER — Encounter: Payer: Self-pay | Admitting: Physician Assistant

## 2023-08-19 ENCOUNTER — Other Ambulatory Visit (INDEPENDENT_AMBULATORY_CARE_PROVIDER_SITE_OTHER): Payer: Self-pay

## 2023-08-19 VITALS — Ht 65.95 in | Wt 243.8 lb

## 2023-08-19 DIAGNOSIS — M1712 Unilateral primary osteoarthritis, left knee: Secondary | ICD-10-CM

## 2023-08-19 DIAGNOSIS — M1711 Unilateral primary osteoarthritis, right knee: Secondary | ICD-10-CM

## 2023-08-19 MED ORDER — METHYLPREDNISOLONE ACETATE 40 MG/ML IJ SUSP
40.0000 mg | INTRAMUSCULAR | Status: AC | PRN
  Administered 2023-08-19: 40 mg via INTRA_ARTICULAR

## 2023-08-19 MED ORDER — LIDOCAINE HCL 1 % IJ SOLN
3.0000 mL | INTRAMUSCULAR | Status: AC | PRN
  Administered 2023-08-19: 3 mL

## 2023-08-19 MED ORDER — LIDOCAINE HCL 1 % IJ SOLN
5.0000 mL | INTRAMUSCULAR | Status: AC | PRN
  Administered 2023-08-19: 5 mL

## 2023-08-19 NOTE — Progress Notes (Addendum)
 HPI: Angela Stark comes in today for follow-up of bilateral knee pain.  She has been working on weight loss.  She is wanting to undergo right total knee arthroplasty in the near future.  Last cortisone injections both knees 05/17/2023 gave her minimal relief.  She does feel that both knees have fluid on them.  She had a fall today onto her left knee.  She reports that she tripped over a dog toy and her 4-year.  No other injuries except for left knee.  Nondiabetic  Review of systems: Negative for fevers chills see HPI otherwise negative  Physical exam: Height 167.5 cm weight 243.8 pounds BMI 39.42 General Well-developed well-nourished female no acute distress mood affect appropriate.  Ambulates without any assistive device slight antalgic gait. Psych alert and oriented x 3.   Bilateral knees: Good range of motion of both knees.  No gross instability with valgus varus stressing.  Anterior drawer is negative bilaterally.  No rashes skin lesions ulcerations impending ulcers or ecchymosis either knee.  Tenderness left knee inferior peripatellar region.  She is able to do a straight leg raise bilaterally.  Positive effusion both knees.  No abnormal warmth or erythema of either knee.  Bilateral knees significant patellofemoral crepitus.  Radiographs:Left knee 3 views: No acute fracture.  Knee is well located.  Sunrise view shows no vertical fracture.  Tricompartmental arthritic changes with near bone-on-bone lateral compartment.  Periarticular osteophytes all 3 compartments.  No bony lesions.  Right knee seen on the AP view and shows bone-on-bone end-stage arthritis medial compartment.  Impression: Tricompartmental arthritis left knee Tricompartmental arthritis right knee  Plan: Given her failure of conservative treatment right knee and the fact that she has lost weight we will sign her up for weight right total knee arthroplasty be performed in the near future.  Risk benefits of surgery discussed with the  patient.  Questions encouraged and answered.  Postoperative protocol reviewed.  Risk include but are not limited to DVT/PE, wound healing problems, blood loss, nerve vessel injury and infection.  Did counsel her on trying to cut back on vaping and how this could adversely affect her wound healing.  Also she has a family history with a brother with a history of blood clots.  No personal history of blood clots.  Therefore we will need to place her on stronger anticoagulation than aspirin.  She did ask for both knees to be aspirated today and we will perform this with a cortisone injection in the left knee.     Procedure Note  Patient: Angela Stark             Date of Birth: 03-25-1970           MRN: [id number]             Visit Date: 08/19/2023  Procedures: Visit Diagnoses:  1. Unilateral primary osteoarthritis, left knee   2. Primary osteoarthritis of right knee     Large Joint Inj: bilateral knee on 08/19/2023 6:18 PM Indications: pain Details: 22 G 1.5 in needle, anterolateral approach  Arthrogram: No  Medications (Right): 3 mL lidocaine  1 % Aspirate (Right): 42 mL yellow Medications (Left): 5 mL lidocaine  1 %; 40 mg methylPREDNISolone  acetate 40 MG/ML Aspirate (Left): 20 mL yellow Outcome: tolerated well, no immediate complications Procedure, treatment alternatives, risks and benefits explained, specific risks discussed. Consent was given by the patient. Immediately prior to procedure a time out was called to verify the correct patient, procedure, equipment, support staff and  site/side marked as required. Patient was prepped and draped in the usual sterile fashion.

## 2023-08-23 ENCOUNTER — Other Ambulatory Visit: Payer: Self-pay | Admitting: Family Medicine

## 2023-08-26 ENCOUNTER — Ambulatory Visit: Admitting: Family Medicine

## 2023-08-26 ENCOUNTER — Encounter: Payer: Self-pay | Admitting: Family Medicine

## 2023-08-26 VITALS — BP 124/64 | HR 66 | Temp 97.2°F | Ht 65.0 in | Wt 246.6 lb

## 2023-08-26 DIAGNOSIS — Z6841 Body Mass Index (BMI) 40.0 and over, adult: Secondary | ICD-10-CM

## 2023-08-26 MED ORDER — TIRZEPATIDE-WEIGHT MANAGEMENT 7.5 MG/0.5ML ~~LOC~~ SOLN: 7.5000 mg | SUBCUTANEOUS | 5 refills | Status: DC

## 2023-08-26 NOTE — Progress Notes (Signed)
 Established Patient Office Visit   Subjective:  Patient ID: Angela Stark, female    DOB: [birth year]  Age: 53 y.o. MRN: [id number]  Chief Complaint  Patient presents with   Medical Management of Chronic Issues    Follow up. Pt is not fasting. Down 17 pounds from last visit.     HPI Encounter Diagnoses  Name Primary?   Morbid obesity with BMI of 40.0-44.9, adult (HCC) Yes   For follow-up of morbid obesity currently treated with tirzepatide 5 mg weekly.  Tolerating the drug well.  Denies nausea or abdominal pain.  Mild constipation treated with a stool softener.  Has been able to lose 17 pounds with her scales at home.  Exercise has been difficult secondary to end-stage arthritis of her right knee.  She now qualifies for replacement which should be scheduled within the next 6 weeks.  He is interested in moving to the higher dose of tirzepatide   Review of Systems  Constitutional: Negative.   HENT: Negative.    Eyes:  Negative for blurred vision, discharge and redness.  Respiratory: Negative.    Cardiovascular: Negative.   Gastrointestinal:  Negative for abdominal pain, nausea and vomiting.  Genitourinary: Negative.   Musculoskeletal: Negative.  Negative for myalgias.  Skin:  Negative for rash.  Neurological:  Negative for tingling, loss of consciousness and weakness.  Endo/Heme/Allergies:  Negative for polydipsia.     Current Outpatient Medications:    amLODipine  (NORVASC ) 10 MG tablet, Take 1 tablet (10 mg total) by mouth daily., Disp: 90 tablet, Rfl: 4   atorvastatin  (LIPITOR) 20 MG tablet, Take 1 tablet (20 mg total) by mouth daily., Disp: 90 tablet, Rfl: 3   buPROPion  (WELLBUTRIN  XL) 150 MG 24 hr tablet, TAKE 1 TABLET BY MOUTH DAILY., Disp: 90 tablet, Rfl: 3   chlorthalidone  (HYGROTON ) 25 MG tablet, Take 1 tablet (25 mg total) by mouth daily., Disp: 100 tablet, Rfl: 0   Multiple Vitamin (MULTIVITAMIN WITH MINERALS) TABS tablet, Take 1 tablet by mouth daily., Disp: ,  Rfl:    naproxen sodium (ALEVE) 220 MG tablet, Take 220 mg by mouth., Disp: , Rfl:    potassium chloride  (KLOR-CON ) 20 MEQ packet, Take by mouth 2 (two) times daily., Disp: , Rfl:    tirzepatide 7.5 MG/0.5ML injection vial, Inject 7.5 mg into the skin once a week., Disp: 2 mL, Rfl: 5  Current Facility-Administered Medications:    0.9 %  sodium chloride  infusion, 500 mL, Intravenous, Once, Daina Drum, MD   Objective:     BP 124/64   Pulse 66   Temp (!) 97.2 F (36.2 C)   Ht 5\' 5"  (1.651 m)   Wt 246 lb 9.6 oz (111.9 kg)   LMP 07/30/2015 (Approximate)   SpO2 97%   BMI 41.04 kg/m  Wt Readings from Last 3 Encounters:  08/26/23 246 lb 9.6 oz (111.9 kg)  08/19/23 243 lb 12.8 oz (110.6 kg)  05/21/23 260 lb (117.9 kg)      Physical Exam Constitutional:      General: She is not in acute distress.    Appearance: Normal appearance. She is not ill-appearing, toxic-appearing or diaphoretic.  HENT:     Head: Normocephalic and atraumatic.     Right Ear: External ear normal.     Left Ear: External ear normal.  Eyes:     General: No scleral icterus.       Right eye: No discharge.        Left eye: No  discharge.     Extraocular Movements: Extraocular movements intact.     Conjunctiva/sclera: Conjunctivae normal.  Pulmonary:     Effort: Pulmonary effort is normal. No respiratory distress.  Skin:    General: Skin is warm and dry.  Neurological:     Mental Status: She is alert and oriented to person, place, and time.  Psychiatric:        Mood and Affect: Mood normal.        Behavior: Behavior normal.      No results found for any visits on 08/26/23.    The 10-year ASCVD risk score (Arnett DK, et al., 2019) is: 1.7%    Assessment & Plan:   Morbid obesity with BMI of 40.0-44.9, adult (HCC) -     Tirzepatide-Weight Management; Inject 7.5 mg into the skin once a week.  Dispense: 2 mL; Refill: 5    Return in about 6 months (around 02/25/2024).  Dose increased to 7.5 mg  weekly.  She will report any increased symptoms such as nausea or constipation.  She will report any significant abdominal pain.  Tonna Frederic, MD

## 2023-08-30 ENCOUNTER — Other Ambulatory Visit: Payer: Self-pay

## 2023-08-30 ENCOUNTER — Other Ambulatory Visit (HOSPITAL_COMMUNITY): Payer: Self-pay

## 2023-08-31 ENCOUNTER — Other Ambulatory Visit: Payer: Self-pay

## 2023-08-31 ENCOUNTER — Other Ambulatory Visit (HOSPITAL_COMMUNITY): Payer: Self-pay

## 2023-08-31 ENCOUNTER — Encounter: Payer: Self-pay | Admitting: Pharmacist

## 2023-09-07 ENCOUNTER — Other Ambulatory Visit: Payer: Self-pay

## 2023-09-07 MED ORDER — TIRZEPATIDE-WEIGHT MANAGEMENT 7.5 MG/0.5ML ~~LOC~~ SOLN: 7.5000 mg | SUBCUTANEOUS | 5 refills | Status: DC

## 2023-09-10 ENCOUNTER — Telehealth: Payer: Self-pay | Admitting: Orthopedic Surgery

## 2023-09-10 NOTE — Telephone Encounter (Signed)
 I have a surgery order to schedule Angela Stark. I called and left a message for her to please call me back to schedule.

## 2023-09-23 NOTE — Progress Notes (Signed)
 Sent message, via epic in basket, requesting orders in epic from Careers adviser.

## 2023-09-30 NOTE — Patient Instructions (Signed)
 SURGICAL WAITING ROOM VISITATION  Patients having surgery or a procedure may have no more than 2 support people in the waiting area - these visitors may rotate.    Children under the age of 106 must have an adult with them who is not the patient.  Visitors with respiratory illnesses are discouraged from visiting and should remain at home.  If the patient needs to stay at the hospital during part of their recovery, the visitor guidelines for inpatient rooms apply. Pre-op nurse will coordinate an appropriate time for 1 support person to accompany patient in pre-op.  This support person may not rotate.    Please refer to the [facility] website for the visitor guidelines for Inpatients (after your surgery is over and you are in a regular room).       Your procedure is scheduled on: 10/08/23   Report to [facility] Main Entrance    Report to admitting at 10:45 AM   Call this number if you have problems the morning of surgery [phone]   Do not eat food :After Midnight.   After Midnight you may have the following liquids until 10:15 AM  DAY OF SURGERY  Water Non-Citrus Juices (without pulp, NO RED-Apple, White grape, White cranberry) Black Coffee (NO MILK/CREAM OR CREAMERS, sugar ok)  Clear Tea (NO MILK/CREAM OR CREAMERS, sugar ok) regular and decaf                             Plain Jell-O (NO RED)                                           Fruit ices (not with fruit pulp, NO RED)                                     Popsicles (NO RED)                                                               Sports drinks like Gatorade (NO RED)                  The day of surgery:  Drink ONE (1) Pre-Surgery Clear Ensure  at 10:15 AM the morning of surgery. Drink in one sitting. Do not sip.  This drink was given to you during your hospital  pre-op appointment visit. Nothing else to drink after completing the  Pre-Surgery Clear Ensure     Oral Hygiene is also important to reduce  your risk of infection.                                    Remember - BRUSH YOUR TEETH THE MORNING OF SURGERY WITH YOUR REGULAR TOOTHPASTE   Do NOT smoke after Midnight   Stop all vitamins and herbal supplements 7 days before surgery.   Take these medicines the morning of surgery with A SIP OF WATER: Amlodipine , atorvastatin , wellbutrin .  Do not take Hygroton (chlorthalidone ) the morning of surgery.  Bring CPAP mask and tubing day of surgery.                              You may not have any metal on your body including hair pins, jewelry, and body piercing             Do not wear make-up, lotions, powders, perfumes/cologne, or deodorant  Do not wear nail polish including gel and S&S, artificial/acrylic nails, or any other type of covering on natural nails including finger and toenails. If you have artificial nails, gel coating, etc. that needs to be removed by a nail salon please have this removed prior to surgery or surgery may need to be canceled/ delayed if the surgeon/ anesthesia feels like they are unable to be safely monitored.   Do not shave  48 hours prior to surgery.    Do not bring valuables to the hospital. [place] IS NOT             RESPONSIBLE   FOR VALUABLES.   Contacts, glasses, dentures or bridgework may not be worn into surgery.   Bring small overnight bag day of surgery.   DO NOT BRING YOUR HOME MEDICATIONS TO THE HOSPITAL. PHARMACY WILL DISPENSE MEDICATIONS LISTED ON YOUR MEDICATION LIST TO YOU DURING YOUR ADMISSION IN THE HOSPITAL!    Patients discharged on the day of surgery will not be allowed to drive home.  Someone NEEDS to stay with you for the first 24 hours after anesthesia.   Special Instructions: Bring a copy of your healthcare power of attorney and living will documents the day of surgery if you haven't scanned them before.              Please read over the following fact sheets you were given: IF YOU HAVE QUESTIONS ABOUT YOUR PRE-OP  INSTRUCTIONS PLEASE CALL [phone] [place]   If you received a COVID test during your pre-op visit  it is requested that you wear a mask when out in public, stay away from anyone that may not be feeling well and notify your surgeon if you develop symptoms. If you test positive for Covid or have been in contact with anyone that has tested positive in the last 10 days please notify you surgeon.      Pre-operative 5 CHG Bath Instructions   You can play a key role in reducing the risk of infection after surgery. Your skin needs to be as free of germs as possible. You can reduce the number of germs on your skin by washing with CHG (chlorhexidine gluconate) soap before surgery. CHG is an antiseptic soap that kills germs and continues to kill germs even after washing.   DO NOT use if you have an allergy to chlorhexidine/CHG or antibacterial soaps. If your skin becomes reddened or irritated, stop using the CHG and notify one of our RNs at [phone].   Please shower with the CHG soap starting 4 days before surgery using the following schedule:     Please keep in mind the following:  DO NOT shave, including legs and underarms, starting the day of your first shower.   You may shave your face at any point before/day of surgery.  Place clean sheets on your bed the day you start using CHG soap. Use a clean washcloth (not used since being washed) for each shower. DO NOT sleep with pets  once you start using the CHG.   CHG Shower Instructions:  If you choose to wash your hair and private area, wash first with your normal shampoo/soap.  After you use shampoo/soap, rinse your hair and body thoroughly to remove shampoo/soap residue.  Turn the water OFF and apply about 3 tablespoons (45 ml) of CHG soap to a CLEAN washcloth.  Apply CHG soap ONLY FROM YOUR NECK DOWN TO YOUR TOES (washing for 3-5 minutes)  DO NOT use CHG soap on face, private areas, open wounds, or sores.  Pay special attention to the  area where your surgery is being performed.  If you are having back surgery, having someone wash your back for you may be helpful. Wait 2 minutes after CHG soap is applied, then you may rinse off the CHG soap.  Pat dry with a clean towel  Put on clean clothes/pajamas   If you choose to wear lotion, please use ONLY the CHG-compatible lotions on the back of this paper.     Additional instructions for the day of surgery: DO NOT APPLY any lotions, deodorants, cologne, or perfumes.   Put on clean/comfortable clothes.  Brush your teeth.  Ask your nurse before applying any prescription medications to the skin.      CHG Compatible Lotions   Aveeno Moisturizing lotion  Cetaphil Moisturizing Cream  Cetaphil Moisturizing Lotion  Clairol Herbal Essence Moisturizing Lotion, Dry Skin  Clairol Herbal Essence Moisturizing Lotion, Extra Dry Skin  Clairol Herbal Essence Moisturizing Lotion, Normal Skin  Curel Age Defying Therapeutic Moisturizing Lotion with Alpha Hydroxy  Curel Extreme Care Body Lotion  Curel Soothing Hands Moisturizing Hand Lotion  Curel Therapeutic Moisturizing Cream, Fragrance-Free  Curel Therapeutic Moisturizing Lotion, Fragrance-Free  Curel Therapeutic Moisturizing Lotion, Original Formula  Eucerin Daily Replenishing Lotion  Eucerin Dry Skin Therapy Plus Alpha Hydroxy Crme  Eucerin Dry Skin Therapy Plus Alpha Hydroxy Lotion  Eucerin Original Crme  Eucerin Original Lotion  Eucerin Plus Crme Eucerin Plus Lotion  Eucerin TriLipid Replenishing Lotion  Keri Anti-Bacterial Hand Lotion  Keri Deep Conditioning Original Lotion Dry Skin Formula Softly Scented  Keri Deep Conditioning Original Lotion, Fragrance Free Sensitive Skin Formula  Keri Lotion Fast Absorbing Fragrance Free Sensitive Skin Formula  Keri Lotion Fast Absorbing Softly Scented Dry Skin Formula  Keri Original Lotion  Keri Skin Renewal Lotion Keri Silky Smooth Lotion  Keri Silky Smooth Sensitive Skin Lotion   Nivea Body Creamy Conditioning Oil  Nivea Body Extra Enriched Lotion  Nivea Body Original Lotion  Nivea Body Sheer Moisturizing Lotion Nivea Crme  Nivea Skin Firming Lotion  NutraDerm 30 Skin Lotion  NutraDerm Skin Lotion  NutraDerm Therapeutic Skin Cream  NutraDerm Therapeutic Skin Lotion  ProShield Protective Hand Cream   Incentive Spirometer   An incentive spirometer is a tool that can help keep your lungs clear and active. This tool measures how well you are filling your lungs with each breath. Taking long deep breaths may help reverse or decrease the chance of developing breathing (pulmonary) problems (especially infection) following: A long period of time when you are unable to move or be active. BEFORE THE PROCEDURE  If the spirometer includes an indicator to show your best effort, your nurse or respiratory therapist will set it to a desired goal. If possible, sit up straight or lean slightly forward. Try not to slouch. Hold the incentive spirometer in an upright position. INSTRUCTIONS FOR USE  Sit on the edge of your bed if possible, or sit up as far as  you can in bed or on a chair. Hold the incentive spirometer in an upright position. Breathe out normally. Place the mouthpiece in your mouth and seal your lips tightly around it. Breathe in slowly and as deeply as possible, raising the piston or the ball toward the top of the column. Hold your breath for 3-5 seconds or for as long as possible. Allow the piston or ball to fall to the bottom of the column. Remove the mouthpiece from your mouth and breathe out normally. Rest for a few seconds and repeat Steps 1 through 7 at least 10 times every 1-2 hours when you are awake. Take your time and take a few normal breaths between deep breaths. The spirometer may include an indicator to show your best effort. Use the indicator as a goal to work toward during each repetition. After each set of 10 deep breaths, practice coughing to be  sure your lungs are clear. If you have an incision (the cut made at the time of surgery), support your incision when coughing by placing a pillow or rolled up towels firmly against it. Once you are able to get out of bed, walk around indoors and cough well. You may stop using the incentive spirometer when instructed by your caregiver.  RISKS AND COMPLICATIONS Take your time so you do not get dizzy or light-headed. If you are in pain, you may need to take or ask for pain medication before doing incentive spirometry. It is harder to take a deep breath if you are having pain. AFTER USE Rest and breathe slowly and easily. It can be helpful to keep track of a log of your progress. Your caregiver can provide you with a simple table to help with this. If you are using the spirometer at home, follow these instructions: SEEK MEDICAL CARE IF:  You are having difficultly using the spirometer. You have trouble using the spirometer as often as instructed. Your pain medication is not giving enough relief while using the spirometer. You develop fever of 100.5 F (38.1 C) or higher. SEEK IMMEDIATE MEDICAL CARE IF:  You cough up bloody sputum that had not been present before. You develop fever of 102 F (38.9 C) or greater. You develop worsening pain at or near the incision site. MAKE SURE YOU:  Understand these instructions. Will watch your condition. Will get help right away if you are not doing well or get worse. Document Released: 07/20/2006 Document Revised: 06/01/2011 Document Reviewed: 09/20/2006 [facility] Patient Information 2014 [place], [place].

## 2023-09-30 NOTE — Progress Notes (Addendum)
 COVID Vaccine received:  []  No [x]  Yes Date of any COVID positive Test in last 90 days: no PCP - Elsie Lent MD Cardiologist -   Chest x-ray - Chest CT 05/05/23 Epic EKG -   Stress Test -  ECHO - 03/16/23 Epic Cardiac Cath -   Bowel Prep - [x]  No  []   Yes ______  Pacemaker / ICD device [x]  No []  Yes   Spinal Cord Stimulator:[x]  No []  Yes       History of Sleep Apnea? []  No [x]  Yes   CPAP used?- []  No [x]  Yes    Does the patient monitor blood sugar?          [x]  No []  Yes  []  N/A  Patient has: [x]  NO Hx DM   []  Pre-DM                 []  DM1  []   DM2 Does patient have a [employer] or Dexacom? []  No []  Yes   Fasting Blood Sugar Ranges-  Checks Blood Sugar _____ times a day  GLP1 agonist / usual dose - Zepbound   last dose 09/24/23. Instructed to hold until after surgery. GLP1 instructions:  SGLT-2 inhibitors / usual dose - n/a SGLT-2 instructions:   Blood Thinner / Instructions:no Aspirin Instructions:no  Comments:   Activity level: Patient is able  to climb a flight of stairs without difficulty; [x]  No CP  [x]  No SOB, but would have _knee pain__   Patient can  perform ADLs without assistance.   Anesthesia review: HTN, murmur, OSA, 1st degree AV block  Patient denies shortness of breath, fever, cough and chest pain at PAT appointment.  Patient verbalized understanding and agreement to the Pre-Surgical Instructions that were given to them at this PAT appointment. Patient was also educated of the need to review these PAT instructions again prior to his/her surgery.I reviewed the appropriate phone numbers to call if they have any and questions or concerns.

## 2023-10-01 ENCOUNTER — Encounter (HOSPITAL_COMMUNITY)
Admission: RE | Admit: 2023-10-01 | Discharge: 2023-10-01 | Disposition: A | Discharge status: Home / Self Care | Source: Ambulatory Visit | Attending: Orthopaedic Surgery | Admitting: Orthopaedic Surgery

## 2023-10-01 ENCOUNTER — Other Ambulatory Visit: Payer: Self-pay

## 2023-10-01 ENCOUNTER — Encounter (HOSPITAL_COMMUNITY): Payer: Self-pay

## 2023-10-01 VITALS — BP 150/82 | HR 69 | Temp 98.2°F | Resp 18 | Ht 66.5 in | Wt 238.0 lb

## 2023-10-01 DIAGNOSIS — Z01818 Encounter for other preprocedural examination: Secondary | ICD-10-CM | POA: Diagnosis not present

## 2023-10-01 DIAGNOSIS — I10 Essential (primary) hypertension: Secondary | ICD-10-CM | POA: Diagnosis not present

## 2023-10-01 DIAGNOSIS — Z01810 Encounter for preprocedural cardiovascular examination: Secondary | ICD-10-CM | POA: Diagnosis present

## 2023-10-01 DIAGNOSIS — M1711 Unilateral primary osteoarthritis, right knee: Secondary | ICD-10-CM | POA: Diagnosis not present

## 2023-10-01 DIAGNOSIS — Z01812 Encounter for preprocedural laboratory examination: Secondary | ICD-10-CM | POA: Diagnosis present

## 2023-10-01 LAB — CBC
HCT: 44.9 % (ref 36.0–46.0)
Hemoglobin: 15.1 g/dL — ABNORMAL HIGH (ref 12.0–15.0)
MCH: 30.8 pg (ref 26.0–34.0)
MCHC: 33.6 g/dL (ref 30.0–36.0)
MCV: 91.6 fL (ref 80.0–100.0)
Platelets: 272 K/uL (ref 150–400)
RBC: 4.9 MIL/uL (ref 3.87–5.11)
RDW: 12.2 % (ref 11.5–15.5)
WBC: 8 K/uL (ref 4.0–10.5)
nRBC: 0 % (ref 0.0–0.2)

## 2023-10-01 LAB — BASIC METABOLIC PANEL WITH GFR
Anion gap: 13 (ref 5–15)
BUN: 9 mg/dL (ref 6–20)
CO2: 29 mmol/L (ref 22–32)
Calcium: 9.3 mg/dL (ref 8.9–10.3)
Chloride: 97 mmol/L — ABNORMAL LOW (ref 98–111)
Creatinine, Ser: 0.64 mg/dL (ref 0.44–1.00)
GFR, Estimated: >60 mL/min (ref >60)
Glucose, Bld: 113 mg/dL — ABNORMAL HIGH (ref 70–99)
Potassium: 3.3 mmol/L — ABNORMAL LOW (ref 3.5–5.1)
Sodium: 139 mmol/L (ref 135–145)

## 2023-10-01 LAB — SURGICAL PCR SCREEN
MRSA, PCR: NEGATIVE
Staphylococcus aureus: POSITIVE — AB

## 2023-10-01 NOTE — Progress Notes (Signed)
 Request routed to Dr. KYM Poli to review pt's Pre op PCR result from 10/01/23.

## 2023-10-04 ENCOUNTER — Encounter (HOSPITAL_COMMUNITY): Payer: Self-pay

## 2023-10-04 NOTE — Anesthesia Preprocedure Evaluation (Addendum)
 Anesthesia Evaluation  Patient identified by MRN, date of birth, ID band Patient awake    Reviewed: Allergy & Precautions, NPO status , Patient's Chart, lab work & pertinent test results  Airway Mallampati: I  TM Distance: >3 FB Neck ROM: Full    Dental  (+) Teeth Intact, Dental Advisory Given   Pulmonary sleep apnea and Continuous Positive Airway Pressure Ventilation , Patient abstained from smoking., former smoker Quit smoking 2023, 10 pack year history    Pulmonary exam normal breath sounds clear to auscultation       Cardiovascular hypertension (133/76 preop), Pt. on medications Normal cardiovascular exam Rhythm:Regular Rate:Normal     Neuro/Psych  PSYCHIATRIC DISORDERS Anxiety Depression    negative neurological ROS     GI/Hepatic ,GERD  Controlled,,(+)     substance abuse (remote hx polysubstance abuse)    Endo/Other  Obesity BMI 38  Renal/GU negative Renal ROS  negative genitourinary   Musculoskeletal  (+) Arthritis , Osteoarthritis,    Abdominal  (+) + obese  Peds  Hematology negative hematology ROS (+) Hb 15.1, plt 272   Anesthesia Other Findings Tirzepatide  LD: 7/4 (>7d)   Reproductive/Obstetrics negative OB ROS                              Anesthesia Physical Anesthesia Plan  ASA: 3  Anesthesia Plan: Spinal, MAC and Regional   Post-op Pain Management: Tylenol PO (pre-op)* and Regional block*   Induction:   PONV Risk Score and Plan: 2 and Propofol infusion and TIVA  Airway Management Planned: Natural Airway and Nasal Cannula  Additional Equipment: None  Intra-op Plan:   Post-operative Plan:   Informed Consent: I have reviewed the patients History and Physical, chart, labs and discussed the procedure including the risks, benefits and alternatives for the proposed anesthesia with the patient or authorized representative who has indicated his/her understanding and  acceptance.       Plan Discussed with: CRNA  Anesthesia Plan Comments: ( )         Anesthesia Quick Evaluation

## 2023-10-07 DIAGNOSIS — M1711 Unilateral primary osteoarthritis, right knee: Secondary | ICD-10-CM | POA: Insufficient documentation

## 2023-10-07 NOTE — H&P (Signed)
 TOTAL KNEE ADMISSION H&P  Patient is being admitted for right total knee arthroplasty.  Subjective:  Chief Complaint:right knee pain.  HPI: Angela Stark, 53 y.o. female, has a history of pain and functional disability in the right knee due to arthritis and has failed non-surgical conservative treatments for greater than 12 weeks to includeNSAID's and/or analgesics, corticosteriod injections, flexibility and strengthening excercises, weight reduction as appropriate, and activity modification.  Onset of symptoms was gradual, starting several years ago with gradually worsening course since that time. The patient noted no past surgery on the right knee(s).  Patient currently rates pain in the right knee(s) at 10 out of 10 with activity. Patient has night pain, worsening of pain with activity and weight bearing, pain that interferes with activities of daily living, pain with passive range of motion, crepitus, and joint swelling.  Patient has evidence of subchondral sclerosis, periarticular osteophytes, and joint space narrowing by imaging studies. There is no active infection.  Patient Active Problem List   Diagnosis Date Noted   Unilateral primary osteoarthritis, right knee 10/07/2023   Bell's palsy 05/10/2023   Obstructive sleep apnea (adult) (pediatric) 05/10/2023   Anemia    Arthritis    Back pain    GERD (gastroesophageal reflux disease)    Hyperlipidemia    Joint pain    Muscle stiffness    Stress    Swelling of both lower extremities    Trouble in sleeping    Morbid obesity with BMI of 40.0-44.9, adult (HCC) 03/22/2023   Palpitations 03/22/2023   Grieving 02/19/2022   Herpes zoster without complication 02/19/2022   Need for shingles vaccine 11/10/2021   Thyroid  nodule 08/09/2020   Heart murmur 08/09/2020   Phlebitis 11/03/2019   Elevated LDL cholesterol level 11/03/2019   Primary osteoarthritis of both knees 01/12/2018   Right knee pain 10/19/2017   Acute pain of left  knee 10/19/2017   Other fatigue 10/18/2017   Shortness of breath on exertion 10/18/2017   Steatosis of liver 09/06/2017   Apnea 05/28/2017   Amenorrhea 05/28/2017   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   Class 3 severe obesity due to excess calories with body mass index (BMI) of 40.0 to 44.9 in adult 08/27/2016   Patellofemoral arthritis of left knee 09/05/2015   Right ankle pain 09/05/2015   Acquired supination of foot 09/05/2015   Essential hypertension 07/30/2015   Visual disturbance 07/30/2015   Right upper quadrant pain 07/30/2015   Vision disturbance 04/16/2015   Tobacco use disorder 04/16/2015   Screening for diabetes mellitus 11/30/2014   Encounter for screening mammogram for breast cancer 08/06/2014   Encounter for smoking cessation counseling 08/06/2014   Alcohol abuse 08/19/2011   Alcoholic hepatitis 08/18/2011   Dehydration 08/18/2011   ARF (acute renal failure) (HCC) 08/18/2011   Hypokalemia 08/18/2011   Hyponatremia 08/18/2011   Thrombocytopenia (HCC) 08/18/2011   Polysubstance dependence including opioid type drug, episodic abuse (HCC) 08/18/2011   Anxiety and depression 07/26/2006   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   Amenorrhea 05/28/2017   Anemia    Anxiety and depression 07/26/2006   Qualifier: Diagnosis of   By: Lorane Browning         Apnea 05/28/2017   ARF (acute renal failure) (HCC) 08/18/2011   Arthritis    Back pain    Bell's palsy 05/10/2023   Class  3 severe obesity due to excess calories with body mass index (BMI) of 40.0 to 44.9 in adult 08/27/2016   Dehydration 08/18/2011   Essential hypertension 07/30/2015   Fatty liver    GERD (gastroesophageal reflux disease)    Heart murmur    When pregnant   Hyperlipidemia    Hypertension    Hypokalemia 08/18/2011   Hyponatremia 08/18/2011   Joint pain     Knee pain    Muscle stiffness    Need for shingles vaccine 11/10/2021   Obstructive sleep apnea (adult) (pediatric) 05/10/2023   Other fatigue 10/18/2017   Palpitations 03/22/2023   Patellofemoral arthritis of left knee 09/05/2015   Phlebitis    Polysubstance dependence including opioid type drug, episodic abuse (HCC) 08/18/2011   Primary osteoarthritis of both knees 01/12/2018   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   Right ankle pain 09/05/2015   Right knee pain 10/19/2017   Right upper quadrant pain 07/30/2015   Screening for diabetes mellitus 11/30/2014   Shortness of breath on exertion    Sleep apnea    Steatosis of liver 09/06/2017   Stress    Swelling of both lower extremities    Thrombocytopenia (HCC) 08/18/2011   Thyroid  nodule 08/09/2020   Tobacco use disorder 04/16/2015   Chronic      Trouble in sleeping    Vision disturbance 04/16/2015   1/17 ?etiology      Past Surgical History:  Procedure Laterality Date   COLONOSCOPY     OVARY SURGERY     WISDOM TOOTH EXTRACTION      Current Facility-Administered Medications  Medication Dose Route Frequency Provider Last Rate Last Admin   0.9 %  sodium chloride  infusion  500 mL Intravenous Once Dorsey, Ying C, MD       Current Outpatient Medications  Medication Sig Dispense Refill Last Dose/Taking   amLODipine  (NORVASC ) 10 MG tablet Take 1 tablet (10 mg total) by mouth daily. 90 tablet 4 Taking   atorvastatin  (LIPITOR) 20 MG tablet Take 1 tablet (20 mg total) by mouth daily. 90 tablet 3 Taking   buPROPion  (WELLBUTRIN  XL) 150 MG 24 hr tablet TAKE 1 TABLET BY MOUTH DAILY. 90 tablet 3 Taking   chlorthalidone  (HYGROTON ) 25 MG tablet Take 1 tablet (25 mg total) by mouth daily. 100 tablet 0 Taking   Multiple Vitamin (MULTIVITAMIN WITH MINERALS) TABS tablet Take 1 tablet by mouth daily.   Taking   naproxen sodium (ALEVE) 220 MG tablet Take 440 mg by mouth daily as needed (pain).   Taking As Needed   potassium chloride  SA  (KLOR-CON  M) 20 MEQ tablet Take 20 mEq by mouth once a week.   Taking   tirzepatide  7.5 MG/0.5ML injection vial Inject 7.5 mg into the skin once a week. 2 mL 5 Taking   No Known Allergies  Social History   Tobacco Use   Smoking status: Former    Current packs/day: 0.00    Average packs/day: 0.5 packs/day for 20.0 years (10.0 ttl pk-yrs)    Types: Cigarettes    Start date: 01/29/2002    Quit date: 01/29/2022    Years since quitting: 1.6   Smokeless tobacco: Never  Substance Use Topics   Alcohol use: No    Family History  Problem Relation Age of Onset   Hypertension Mother    Hyperlipidemia Mother    Diabetes Mother    Emphysema Mother    Obesity Mother    Prostate cancer Father    Heart disease Father  Hypertension Father    Hyperlipidemia Father    Obesity Father    Sleep apnea Father    Alcoholism Father    Stroke Brother    Lung cancer Maternal Grandmother    Breast cancer Neg Hx    Colon cancer Neg Hx    Colon polyps Neg Hx    Esophageal cancer Neg Hx    Rectal cancer Neg Hx    Stomach cancer Neg Hx      Review of Systems  Objective:  Physical Exam Vitals reviewed.  Constitutional:      Appearance: Normal appearance. She is obese.  HENT:     Head: Normocephalic and atraumatic.  Eyes:     Extraocular Movements: Extraocular movements intact.     Pupils: Pupils are equal, round, and reactive to light.  Cardiovascular:     Rate and Rhythm: Normal rate and regular rhythm.  Pulmonary:     Effort: Pulmonary effort is normal.     Breath sounds: Normal breath sounds.  Abdominal:     Palpations: Abdomen is soft.  Musculoskeletal:     Cervical back: Normal range of motion and neck supple.     Right knee: Effusion, bony tenderness and crepitus present. Decreased range of motion. Tenderness present over the medial joint line and lateral joint line. Abnormal alignment.  Neurological:     Mental Status: She is alert and oriented to person, place, and time.   Psychiatric:        Behavior: Behavior normal.     Vital signs in last 24 hours:    Labs:   Estimated body mass index is 37.84 kg/m as calculated from the following:   Height as of 10/01/23: 5' 6.5 (1.689 m).   Weight as of 10/01/23: 108 kg.   Imaging Review Plain radiographs demonstrate severe degenerative joint disease of the right knee(s). The overall alignment ismild varus. The bone quality appears to be excellent for age and reported activity level.      Assessment/Plan:  End stage arthritis, right knee   The patient history, physical examination, clinical judgment of the provider and imaging studies are consistent with end stage degenerative joint disease of the right knee(s) and total knee arthroplasty is deemed medically necessary. The treatment options including medical management, injection therapy arthroscopy and arthroplasty were discussed at length. The risks and benefits of total knee arthroplasty were presented and reviewed. The risks due to aseptic loosening, infection, stiffness, patella tracking problems, thromboembolic complications and other imponderables were discussed. The patient acknowledged the explanation, agreed to proceed with the plan and consent was signed. Patient is being admitted for inpatient treatment for surgery, pain control, PT, OT, prophylactic antibiotics, VTE prophylaxis, progressive ambulation and ADL's and discharge planning. The patient is planning to be discharged home with home health services

## 2023-10-08 ENCOUNTER — Observation Stay (HOSPITAL_COMMUNITY)

## 2023-10-08 ENCOUNTER — Other Ambulatory Visit: Payer: Self-pay

## 2023-10-08 ENCOUNTER — Encounter (HOSPITAL_COMMUNITY): Payer: Self-pay | Admitting: Orthopaedic Surgery

## 2023-10-08 ENCOUNTER — Other Ambulatory Visit (HOSPITAL_COMMUNITY): Payer: Self-pay

## 2023-10-08 ENCOUNTER — Ambulatory Visit (HOSPITAL_COMMUNITY): Payer: Self-pay | Admitting: Anesthesiology

## 2023-10-08 ENCOUNTER — Encounter (HOSPITAL_COMMUNITY)
Admission: RE | Disposition: A | Discharge status: Home / Self Care | Payer: Self-pay | Source: Home / Self Care | Attending: Orthopaedic Surgery

## 2023-10-08 ENCOUNTER — Ambulatory Visit (HOSPITAL_COMMUNITY): Payer: Self-pay | Admitting: Medical

## 2023-10-08 ENCOUNTER — Observation Stay (HOSPITAL_COMMUNITY)
Admission: RE | Admit: 2023-10-08 | Discharge: 2023-10-09 | Disposition: A | Discharge status: Home / Self Care | Attending: Orthopaedic Surgery | Admitting: Orthopaedic Surgery

## 2023-10-08 DIAGNOSIS — M1711 Unilateral primary osteoarthritis, right knee: Secondary | ICD-10-CM

## 2023-10-08 DIAGNOSIS — Z79899 Other long term (current) drug therapy: Secondary | ICD-10-CM | POA: Diagnosis not present

## 2023-10-08 DIAGNOSIS — M25562 Pain in left knee: Secondary | ICD-10-CM

## 2023-10-08 DIAGNOSIS — Z471 Aftercare following joint replacement surgery: Secondary | ICD-10-CM | POA: Diagnosis not present

## 2023-10-08 DIAGNOSIS — M1990 Unspecified osteoarthritis, unspecified site: Secondary | ICD-10-CM

## 2023-10-08 DIAGNOSIS — F418 Other specified anxiety disorders: Secondary | ICD-10-CM | POA: Diagnosis not present

## 2023-10-08 DIAGNOSIS — Z96651 Presence of right artificial knee joint: Secondary | ICD-10-CM | POA: Diagnosis not present

## 2023-10-08 DIAGNOSIS — I10 Essential (primary) hypertension: Secondary | ICD-10-CM

## 2023-10-08 DIAGNOSIS — Z87891 Personal history of nicotine dependence: Secondary | ICD-10-CM | POA: Diagnosis not present

## 2023-10-08 DIAGNOSIS — M25569 Pain in unspecified knee: Secondary | ICD-10-CM

## 2023-10-08 DIAGNOSIS — G8918 Other acute postprocedural pain: Secondary | ICD-10-CM | POA: Diagnosis not present

## 2023-10-08 DIAGNOSIS — M170 Bilateral primary osteoarthritis of knee: Secondary | ICD-10-CM

## 2023-10-08 DIAGNOSIS — M25461 Effusion, right knee: Secondary | ICD-10-CM | POA: Diagnosis not present

## 2023-10-08 DIAGNOSIS — G8929 Other chronic pain: Secondary | ICD-10-CM

## 2023-10-08 HISTORY — PX: TOTAL KNEE ARTHROPLASTY: SHX125

## 2023-10-08 SURGERY — ARTHROPLASTY, KNEE, TOTAL
Anesthesia: Monitor Anesthesia Care | Site: Knee | Laterality: Right

## 2023-10-08 MED ORDER — AMISULPRIDE (ANTIEMETIC) 5 MG/2ML IV SOLN: 10.0000 mg | Freq: Once | INTRAVENOUS | Status: DC | PRN

## 2023-10-08 MED ORDER — TRANEXAMIC ACID-NACL 1000-0.7 MG/100ML-% IV SOLN
1000.0000 mg | INTRAVENOUS | Status: AC
  Administered 2023-10-08: 1000 mg via INTRAVENOUS
  Filled 2023-10-08: qty 100

## 2023-10-08 MED ORDER — ATORVASTATIN CALCIUM 20 MG PO TABS
20.0000 mg | ORAL_TABLET | Freq: Every day | ORAL | Status: DC
  Administered 2023-10-09: 20 mg via ORAL
  Filled 2023-10-08: qty 1

## 2023-10-08 MED ORDER — CEFAZOLIN SODIUM-DEXTROSE 2-4 GM/100ML-% IV SOLN
2.0000 g | Freq: Four times a day (QID) | INTRAVENOUS | Status: AC
  Administered 2023-10-08 – 2023-10-09 (×2): 2 g via INTRAVENOUS
  Filled 2023-10-08 (×2): qty 100

## 2023-10-08 MED ORDER — DEXAMETHASONE SODIUM PHOSPHATE 10 MG/ML IJ SOLN
10.0000 mg | Freq: Once | INTRAMUSCULAR | Status: AC
  Administered 2023-10-09: 10 mg via INTRAVENOUS
  Filled 2023-10-08: qty 1

## 2023-10-08 MED ORDER — BUPIVACAINE IN DEXTROSE 0.75-8.25 % IT SOLN
INTRATHECAL | Status: DC | PRN
  Administered 2023-10-08: 1.6 mL via INTRATHECAL

## 2023-10-08 MED ORDER — DIPHENHYDRAMINE HCL 12.5 MG/5ML PO ELIX: 12.5000 mg | ORAL_SOLUTION | ORAL | Status: DC | PRN

## 2023-10-08 MED ORDER — ONDANSETRON HCL 4 MG PO TABS: 4.0000 mg | ORAL_TABLET | Freq: Four times a day (QID) | ORAL | Status: DC | PRN

## 2023-10-08 MED ORDER — ONDANSETRON HCL 4 MG/2ML IJ SOLN
INTRAMUSCULAR | Status: DC | PRN
  Administered 2023-10-08: 4 mg via INTRAVENOUS

## 2023-10-08 MED ORDER — CHLORTHALIDONE 25 MG PO TABS
25.0000 mg | ORAL_TABLET | Freq: Every day | ORAL | Status: DC
  Administered 2023-10-08 – 2023-10-09 (×2): 25 mg via ORAL
  Filled 2023-10-08 (×2): qty 1

## 2023-10-08 MED ORDER — PROPOFOL 500 MG/50ML IV EMUL
INTRAVENOUS | Status: DC | PRN
  Administered 2023-10-08: 50 mg via INTRAVENOUS
  Administered 2023-10-08: 30 mg via INTRAVENOUS
  Administered 2023-10-08: 50 mg via INTRAVENOUS
  Administered 2023-10-08: 20 mg via INTRAVENOUS
  Administered 2023-10-08: 70 ug/kg/min via INTRAVENOUS

## 2023-10-08 MED ORDER — KETOROLAC TROMETHAMINE 15 MG/ML IJ SOLN
7.5000 mg | Freq: Four times a day (QID) | INTRAMUSCULAR | Status: AC
  Administered 2023-10-08 – 2023-10-09 (×3): 7.5 mg via INTRAVENOUS
  Filled 2023-10-08 (×3): qty 1

## 2023-10-08 MED ORDER — SODIUM CHLORIDE 0.9 % IR SOLN
Status: DC | PRN
  Administered 2023-10-08: 1000 mL

## 2023-10-08 MED ORDER — ACETAMINOPHEN 325 MG PO TABS: 325.0000 mg | ORAL_TABLET | Freq: Four times a day (QID) | ORAL | Status: DC | PRN

## 2023-10-08 MED ORDER — ASPIRIN 81 MG PO CHEW
81.0000 mg | CHEWABLE_TABLET | Freq: Two times a day (BID) | ORAL | Status: DC
  Administered 2023-10-08 – 2023-10-09 (×2): 81 mg via ORAL
  Filled 2023-10-08 (×2): qty 1

## 2023-10-08 MED ORDER — METHOCARBAMOL 1000 MG/10ML IJ SOLN: 500.0000 mg | Freq: Four times a day (QID) | INTRAMUSCULAR | Status: DC | PRN

## 2023-10-08 MED ORDER — ONDANSETRON HCL 4 MG/2ML IJ SOLN: 4.0000 mg | Freq: Four times a day (QID) | INTRAMUSCULAR | Status: DC | PRN

## 2023-10-08 MED ORDER — METHOCARBAMOL 500 MG PO TABS
500.0000 mg | ORAL_TABLET | Freq: Four times a day (QID) | ORAL | Status: DC | PRN
Start: 2023-10-08 — End: 2023-10-09
  Administered 2023-10-08 – 2023-10-09 (×3): 500 mg via ORAL
  Filled 2023-10-08 (×2): qty 1

## 2023-10-08 MED ORDER — METOCLOPRAMIDE HCL 5 MG/ML IJ SOLN: 5.0000 mg | Freq: Three times a day (TID) | INTRAMUSCULAR | Status: DC | PRN

## 2023-10-08 MED ORDER — HYDROMORPHONE HCL 1 MG/ML IJ SOLN
0.2500 mg | INTRAMUSCULAR | Status: DC | PRN
  Administered 2023-10-08 (×2): 0.5 mg via INTRAVENOUS

## 2023-10-08 MED ORDER — OXYCODONE HCL 5 MG PO TABS
10.0000 mg | ORAL_TABLET | ORAL | Status: DC | PRN
  Administered 2023-10-08 – 2023-10-09 (×2): 10 mg via ORAL
  Filled 2023-10-08: qty 2

## 2023-10-08 MED ORDER — PHENYLEPHRINE 80 MCG/ML (10ML) SYRINGE FOR IV PUSH (FOR BLOOD PRESSURE SUPPORT)
PREFILLED_SYRINGE | INTRAVENOUS | Status: DC | PRN
  Administered 2023-10-08: 160 ug via INTRAVENOUS

## 2023-10-08 MED ORDER — MENTHOL 3 MG MT LOZG: 1.0000 | LOZENGE | OROMUCOSAL | Status: DC | PRN

## 2023-10-08 MED ORDER — POTASSIUM CHLORIDE CRYS ER 20 MEQ PO TBCR
20.0000 meq | EXTENDED_RELEASE_TABLET | ORAL | Status: DC
  Administered 2023-10-08: 20 meq via ORAL
  Filled 2023-10-08: qty 1

## 2023-10-08 MED ORDER — 0.9 % SODIUM CHLORIDE (POUR BTL) OPTIME
TOPICAL | Status: DC | PRN
  Administered 2023-10-08: 1000 mL

## 2023-10-08 MED ORDER — FENTANYL CITRATE PF 50 MCG/ML IJ SOSY
50.0000 ug | PREFILLED_SYRINGE | Freq: Once | INTRAMUSCULAR | Status: AC
  Administered 2023-10-08: 100 ug via INTRAVENOUS
  Filled 2023-10-08: qty 2

## 2023-10-08 MED ORDER — PROPOFOL 1000 MG/100ML IV EMUL
INTRAVENOUS | Status: AC
Start: 2023-10-08 — End: 2023-10-08
  Filled 2023-10-08: qty 100

## 2023-10-08 MED ORDER — PROPOFOL 1000 MG/100ML IV EMUL
INTRAVENOUS | Status: AC
  Filled 2023-10-08: qty 100

## 2023-10-08 MED ORDER — BUPROPION HCL ER (XL) 150 MG PO TB24
150.0000 mg | ORAL_TABLET | Freq: Every day | ORAL | Status: DC
Start: 2023-10-09 — End: 2023-10-09
  Administered 2023-10-09: 150 mg via ORAL
  Filled 2023-10-08: qty 1

## 2023-10-08 MED ORDER — PROPOFOL 10 MG/ML IV BOLUS
INTRAVENOUS | Status: AC
Start: 2023-10-08 — End: 2023-10-08
  Filled 2023-10-08: qty 20

## 2023-10-08 MED ORDER — ROPIVACAINE HCL 5 MG/ML IJ SOLN
INTRAMUSCULAR | Status: DC | PRN
Start: 2023-10-08 — End: 2023-10-08
  Administered 2023-10-08: 30 mL via PERINEURAL

## 2023-10-08 MED ORDER — PANTOPRAZOLE SODIUM 40 MG PO TBEC
40.0000 mg | DELAYED_RELEASE_TABLET | Freq: Every day | ORAL | Status: DC
  Administered 2023-10-08 – 2023-10-09 (×2): 40 mg via ORAL
  Filled 2023-10-08 (×2): qty 1

## 2023-10-08 MED ORDER — POVIDONE-IODINE 10 % EX SWAB
2.0000 | Freq: Once | CUTANEOUS | Status: AC
  Administered 2023-10-08: 2 via TOPICAL

## 2023-10-08 MED ORDER — ADULT MULTIVITAMIN W/MINERALS CH
1.0000 | ORAL_TABLET | Freq: Every day | ORAL | Status: DC
  Administered 2023-10-09: 1 via ORAL
  Filled 2023-10-08: qty 1

## 2023-10-08 MED ORDER — CHLORHEXIDINE GLUCONATE 4 % EX SOLN
1.0000 | CUTANEOUS | 1 refills | Status: AC
  Filled 2023-10-08: qty 946, 70d supply, fill #0

## 2023-10-08 MED ORDER — HYDROMORPHONE HCL 1 MG/ML IJ SOLN: 0.5000 mg | INTRAMUSCULAR | Status: DC | PRN

## 2023-10-08 MED ORDER — DOCUSATE SODIUM 100 MG PO CAPS
100.0000 mg | ORAL_CAPSULE | Freq: Two times a day (BID) | ORAL | Status: DC
  Administered 2023-10-08 – 2023-10-09 (×2): 100 mg via ORAL
  Filled 2023-10-08 (×2): qty 1

## 2023-10-08 MED ORDER — DEXAMETHASONE SODIUM PHOSPHATE 10 MG/ML IJ SOLN
INTRAMUSCULAR | Status: AC
Start: 2023-10-08 — End: 2023-10-08
  Filled 2023-10-08: qty 1

## 2023-10-08 MED ORDER — ONDANSETRON HCL 4 MG/2ML IJ SOLN: 4.0000 mg | Freq: Once | INTRAMUSCULAR | Status: DC | PRN

## 2023-10-08 MED ORDER — FENTANYL CITRATE (PF) 100 MCG/2ML IJ SOLN
INTRAMUSCULAR | Status: AC
  Filled 2023-10-08: qty 2

## 2023-10-08 MED ORDER — ORAL CARE MOUTH RINSE
15.0000 mL | Freq: Once | OROMUCOSAL | Status: AC
Start: 2023-10-08 — End: 2023-10-08

## 2023-10-08 MED ORDER — LACTATED RINGERS IV SOLN: INTRAVENOUS | Status: DC

## 2023-10-08 MED ORDER — AMLODIPINE BESYLATE 10 MG PO TABS
10.0000 mg | ORAL_TABLET | Freq: Every day | ORAL | Status: DC
  Administered 2023-10-09: 10 mg via ORAL
  Filled 2023-10-08: qty 1

## 2023-10-08 MED ORDER — DEXMEDETOMIDINE HCL IN NACL 80 MCG/20ML IV SOLN
INTRAVENOUS | Status: DC | PRN
  Administered 2023-10-08: 12 ug via INTRAVENOUS

## 2023-10-08 MED ORDER — SODIUM CHLORIDE 0.9 % IV SOLN: INTRAVENOUS | Status: DC

## 2023-10-08 MED ORDER — METHOCARBAMOL 500 MG PO TABS
ORAL_TABLET | ORAL | Status: AC
  Filled 2023-10-08: qty 1

## 2023-10-08 MED ORDER — MUPIROCIN 2 % EX OINT
1.0000 | TOPICAL_OINTMENT | Freq: Two times a day (BID) | CUTANEOUS | 0 refills | Status: AC
  Filled 2023-10-08: qty 66, 56d supply, fill #0
  Filled 2023-10-09: qty 66, 33d supply, fill #0

## 2023-10-08 MED ORDER — ALUM & MAG HYDROXIDE-SIMETH 200-200-20 MG/5ML PO SUSP: 30.0000 mL | ORAL | Status: DC | PRN

## 2023-10-08 MED ORDER — OXYCODONE HCL 5 MG PO TABS
5.0000 mg | ORAL_TABLET | Freq: Once | ORAL | Status: AC | PRN
  Administered 2023-10-08: 5 mg via ORAL

## 2023-10-08 MED ORDER — METOCLOPRAMIDE HCL 5 MG PO TABS: 5.0000 mg | ORAL_TABLET | Freq: Three times a day (TID) | ORAL | Status: DC | PRN

## 2023-10-08 MED ORDER — KETOROLAC TROMETHAMINE 30 MG/ML IJ SOLN: 30.0000 mg | Freq: Once | INTRAMUSCULAR | Status: DC | PRN

## 2023-10-08 MED ORDER — OXYCODONE HCL 5 MG PO TABS
5.0000 mg | ORAL_TABLET | ORAL | Status: DC | PRN
  Filled 2023-10-08: qty 2

## 2023-10-08 MED ORDER — PHENOL 1.4 % MT LIQD: 1.0000 | OROMUCOSAL | Status: DC | PRN

## 2023-10-08 MED ORDER — OXYCODONE HCL 5 MG PO TABS
ORAL_TABLET | ORAL | Status: AC
  Filled 2023-10-08: qty 1

## 2023-10-08 MED ORDER — DEXAMETHASONE SODIUM PHOSPHATE 10 MG/ML IJ SOLN
INTRAMUSCULAR | Status: DC | PRN
  Administered 2023-10-08: 10 mg
  Administered 2023-10-08: 8 mg

## 2023-10-08 MED ORDER — BUPIVACAINE-EPINEPHRINE 0.25% -1:200000 IJ SOLN
INTRAMUSCULAR | Status: DC | PRN
  Administered 2023-10-08: 30 mL

## 2023-10-08 MED ORDER — ONDANSETRON HCL 4 MG/2ML IJ SOLN
INTRAMUSCULAR | Status: AC
  Filled 2023-10-08: qty 2

## 2023-10-08 MED ORDER — MIDAZOLAM HCL 2 MG/2ML IJ SOLN
1.0000 mg | Freq: Once | INTRAMUSCULAR | Status: AC
  Administered 2023-10-08: 2 mg via INTRAVENOUS
  Filled 2023-10-08: qty 2

## 2023-10-08 MED ORDER — POLYETHYLENE GLYCOL 3350 17 G PO PACK: 17.0000 g | PACK | Freq: Every day | ORAL | Status: DC | PRN

## 2023-10-08 MED ORDER — OXYCODONE HCL 5 MG/5ML PO SOLN: 5.0000 mg | Freq: Once | ORAL | Status: AC | PRN

## 2023-10-08 MED ORDER — CHLORHEXIDINE GLUCONATE 0.12 % MT SOLN
15.0000 mL | Freq: Once | OROMUCOSAL | Status: AC
  Administered 2023-10-08: 15 mL via OROMUCOSAL

## 2023-10-08 MED ORDER — PROPOFOL 10 MG/ML IV BOLUS
INTRAVENOUS | Status: AC
  Filled 2023-10-08: qty 20

## 2023-10-08 MED ORDER — MIDAZOLAM HCL 2 MG/2ML IJ SOLN
INTRAMUSCULAR | Status: AC
  Filled 2023-10-08: qty 2

## 2023-10-08 MED ORDER — ACETAMINOPHEN 500 MG PO TABS
1000.0000 mg | ORAL_TABLET | Freq: Once | ORAL | Status: AC
  Administered 2023-10-08: 1000 mg via ORAL
  Filled 2023-10-08: qty 2

## 2023-10-08 MED ORDER — HYDROMORPHONE HCL 1 MG/ML IJ SOLN
INTRAMUSCULAR | Status: AC
  Filled 2023-10-08: qty 1

## 2023-10-08 MED ORDER — PHENYLEPHRINE 80 MCG/ML (10ML) SYRINGE FOR IV PUSH (FOR BLOOD PRESSURE SUPPORT)
PREFILLED_SYRINGE | INTRAVENOUS | Status: AC
  Filled 2023-10-08: qty 10

## 2023-10-08 MED ORDER — CEFAZOLIN SODIUM-DEXTROSE 2-4 GM/100ML-% IV SOLN
2.0000 g | INTRAVENOUS | Status: AC
  Administered 2023-10-08: 2 g via INTRAVENOUS
  Filled 2023-10-08: qty 100

## 2023-10-08 MED ORDER — MEPERIDINE HCL 25 MG/ML IJ SOLN: 6.2500 mg | INTRAMUSCULAR | Status: DC | PRN

## 2023-10-08 SURGICAL SUPPLY — 50 items
BAG COUNTER SPONGE SURGICOUNT (BAG) IMPLANT
BAG ZIPLOCK 12X15 (MISCELLANEOUS) ×1 IMPLANT
BENZOIN TINCTURE PRP APPL 2/3 (GAUZE/BANDAGES/DRESSINGS) IMPLANT
BLADE SAG 18X100X1.27 (BLADE) ×1 IMPLANT
BNDG ELASTIC 6INX 5YD STR LF (GAUZE/BANDAGES/DRESSINGS) ×2 IMPLANT
BOWL SMART MIX CTS (DISPOSABLE) IMPLANT
COMPONENT FEM STD PS 8 RT (Joint) IMPLANT
COMPONENT PATELLA PEG 3 32 (Joint) IMPLANT
COOLER ICEMAN CLASSIC (MISCELLANEOUS) ×1 IMPLANT
COVER SURGICAL LIGHT HANDLE (MISCELLANEOUS) ×1 IMPLANT
CUFF TRNQT CYL 34X4.125X (TOURNIQUET CUFF) ×1 IMPLANT
DRAPE U-SHAPE 47X51 STRL (DRAPES) ×1 IMPLANT
DURAPREP 26ML APPLICATOR (WOUND CARE) ×1 IMPLANT
ELECT BLADE TIP CTD 4 INCH (ELECTRODE) ×1 IMPLANT
ELECT PENCIL ROCKER SW 15FT (MISCELLANEOUS) ×1 IMPLANT
ELECT REM PT RETURN 15FT ADLT (MISCELLANEOUS) ×1 IMPLANT
GAUZE PAD ABD 8X10 STRL (GAUZE/BANDAGES/DRESSINGS) ×2 IMPLANT
GAUZE SPONGE 4X4 12PLY STRL (GAUZE/BANDAGES/DRESSINGS) ×1 IMPLANT
GAUZE XEROFORM 1X8 LF (GAUZE/BANDAGES/DRESSINGS) IMPLANT
GLOVE BIO SURGEON STRL SZ7.5 (GLOVE) ×1 IMPLANT
GLOVE BIOGEL PI IND STRL 8 (GLOVE) ×2 IMPLANT
GLOVE ECLIPSE 8.0 STRL XLNG CF (GLOVE) ×1 IMPLANT
GOWN STRL REUS W/ TWL XL LVL3 (GOWN DISPOSABLE) ×2 IMPLANT
HOLDER FOLEY CATH W/STRAP (MISCELLANEOUS) IMPLANT
IMMOBILIZER KNEE 20 THIGH 36 (SOFTGOODS) ×1 IMPLANT
INSERT TIBIA KNEE RIGHT 10 (Joint) IMPLANT
KIT TURNOVER KIT A (KITS) ×1 IMPLANT
MANIFOLD NEPTUNE II (INSTRUMENTS) ×1 IMPLANT
NS IRRIG 1000ML POUR BTL (IV SOLUTION) ×1 IMPLANT
PACK TOTAL KNEE CUSTOM (KITS) ×1 IMPLANT
PAD COLD SHLDR WRAP-ON (PAD) ×1 IMPLANT
PADDING CAST COTTON 6X4 STRL (CAST SUPPLIES) ×2 IMPLANT
PIN DRILL HDLS TROCAR 75 4PK (PIN) IMPLANT
PROSTHESIS TIB KNEE PS 0D F RT (Joint) IMPLANT
PROTECTOR NERVE ULNAR (MISCELLANEOUS) ×1 IMPLANT
SCREW FEMALE HEX FIX 25X2.5 (ORTHOPEDIC DISPOSABLE SUPPLIES) IMPLANT
SET HNDPC FAN SPRY TIP SCT (DISPOSABLE) ×1 IMPLANT
SET PAD KNEE POSITIONER (MISCELLANEOUS) ×1 IMPLANT
SPIKE FLUID TRANSFER (MISCELLANEOUS) IMPLANT
STAPLER SKIN PROX 35W (STAPLE) IMPLANT
STRIP CLOSURE SKIN 1/2X4 (GAUZE/BANDAGES/DRESSINGS) IMPLANT
SUT MNCRL AB 4-0 PS2 18 (SUTURE) IMPLANT
SUT VIC AB 0 CT1 36 (SUTURE) ×1 IMPLANT
SUT VIC AB 1 CT1 36 (SUTURE) ×2 IMPLANT
SUT VIC AB 2-0 CT1 TAPERPNT 27 (SUTURE) ×2 IMPLANT
TOWEL GREEN STERILE FF (TOWEL DISPOSABLE) ×1 IMPLANT
TRAY FOLEY MTR SLVR 14FR STAT (SET/KITS/TRAYS/PACK) IMPLANT
TRAY FOLEY MTR SLVR 16FR STAT (SET/KITS/TRAYS/PACK) IMPLANT
WATER STERILE IRR 1000ML POUR (IV SOLUTION) ×2 IMPLANT
YANKAUER SUCT BULB TIP NO VENT (SUCTIONS) ×1 IMPLANT

## 2023-10-08 NOTE — Progress Notes (Signed)
   10/08/23 2208  BiPAP/CPAP/SIPAP  Minimum cmH2O (S)  5 cmH2O

## 2023-10-08 NOTE — Plan of Care (Signed)
   Problem: Activity: Goal: Risk for activity intolerance will decrease Outcome: Progressing   Problem: Nutrition: Goal: Adequate nutrition will be maintained Outcome: Progressing   Problem: Pain Managment: Goal: General experience of comfort will improve and/or be controlled Outcome: Progressing   Problem: Safety: Goal: Ability to remain free from injury will improve Outcome: Progressing

## 2023-10-08 NOTE — Transfer of Care (Signed)
 Immediate Anesthesia Transfer of Care Note  Patient: Angela Stark  Procedure(s) Performed: ARTHROPLASTY, KNEE, TOTAL (Right: Knee)  Patient Location: PACU  Anesthesia Type:MAC, Regional, and Spinal  Level of Consciousness: awake, alert , oriented, and patient cooperative  Airway & Oxygen Therapy: Patient Spontanous Breathing  Post-op Assessment: Report given to RN and Post -op Vital signs reviewed and stable  Post vital signs: Reviewed and stable  Last Vitals:  Vitals Value Taken Time  BP 122/72 10/08/23 14:40  Temp    Pulse 63 10/08/23 14:41  Resp 22 10/08/23 14:41  SpO2 94 % 10/08/23 14:41  Vitals shown include unfiled device data.  Last Pain:  Vitals:   10/08/23 1159  TempSrc:   PainSc: 0-No pain         Complications: No notable events documented.

## 2023-10-08 NOTE — Anesthesia Postprocedure Evaluation (Signed)
 Anesthesia Post Note  Patient: ANYE BROSE  Procedure(s) Performed: ARTHROPLASTY, KNEE, TOTAL (Right: Knee)     Patient location during evaluation: PACU Anesthesia Type: Regional, Spinal and MAC Level of consciousness: awake and alert and oriented Pain management: pain level controlled Vital Signs Assessment: post-procedure vital signs reviewed and stable Respiratory status: spontaneous breathing, nonlabored ventilation and respiratory function stable Cardiovascular status: blood pressure returned to baseline and stable Postop Assessment: no headache, no backache, spinal receding and no apparent nausea or vomiting Anesthetic complications: no   No notable events documented.  Last Vitals:  Vitals:   10/08/23 1500 10/08/23 1515  BP: 123/72 124/69  Pulse: (!) 46 (!) 48  Resp: 14 15  Temp:    SpO2: 96% 92%    Last Pain:  Vitals:   10/08/23 1521  TempSrc:   PainSc: 3                  Almarie CHRISTELLA Marchi

## 2023-10-08 NOTE — Anesthesia Procedure Notes (Signed)
 Spinal  Patient location during procedure: OR Start time: 10/08/2023 12:52 PM End time: 10/08/2023 1:03 PM Reason for block: surgical anesthesia Staffing Performed: anesthesiologist  Anesthesiologist: Merla Almarie HERO, DO Performed by: Merla Almarie HERO, DO Authorized by: Merla Almarie HERO, DO   Preanesthetic Checklist Completed: patient identified, IV checked, risks and benefits discussed, surgical consent, monitors and equipment checked, pre-op evaluation and timeout performed Spinal Block Patient position: sitting Prep: DuraPrep and site prepped and draped Patient monitoring: cardiac monitor, continuous pulse ox and blood pressure Approach: midline Location: L3-4 Injection technique: single-shot Needle Needle type: Pencan  Needle gauge: 24 G Needle length: 12.7 cm Assessment Sensory level: T6 Events: CSF return Additional Notes Functioning IV was confirmed and monitors were applied. Sterile prep and drape, including hand hygiene and sterile gloves were used. The patient was positioned and the spine was prepped. The skin was anesthetized with lidocaine .  Free flow of clear CSF was obtained prior to injecting local anesthetic into the CSF.  The spinal needle aspirated freely following injection.  The needle was carefully withdrawn.  The patient tolerated the procedure well.   Difficult spinal at two levels due to inability to palpate landmarks/midline. 24G x pencan used after unsuccessful attempts w/ spinal kit needle ( )

## 2023-10-08 NOTE — Evaluation (Signed)
 Physical Therapy Evaluation Patient Details Name: Angela Stark MRN: [id number] DOB: September 23, 1970 Today's Date: 10/08/2023  History of Present Illness  53 yo female presents to therapy s/p R TKA on 10/08/2023 due to failure of conservative measures. Pt PMH includes but is not limited to: bell's palsy (04/2023), GERD, HLD, DOE, HTN, anxiety and depression, alcoholic hepatitis, anemia, ARF, LBP, and OSA.  Clinical Impression    Angela Stark is a 53 y.o. female POD 0 s/p R TKA. Patient reports IND with mobility at baseline. Patient is now limited by functional impairments (see PT problem list below) and requires S for bed mobility and CGA and cues for transfers. Patient was able to ambulate 60 feet with RW and CGA level of assist. Patient instructed in exercise to facilitate ROM and circulation to manage edema.  Patient will benefit from continued skilled PT interventions to address impairments and progress towards PLOF. Acute PT will follow to progress mobility and stair training in preparation for safe discharge home with family support and [facility] services.        If plan is discharge home, recommend the following: A little help with walking and/or transfers;A little help with bathing/dressing/bathroom;Assistance with cooking/housework;Assist for transportation;Help with stairs or ramp for entrance   Can travel by private vehicle        Equipment Recommendations Rolling walker (2 wheels)  Recommendations for Other Services       Functional Status Assessment Patient has had a recent decline in their functional status and demonstrates the ability to make significant improvements in function in a reasonable and predictable amount of time.     Precautions / Restrictions Precautions Precautions: Knee;Fall Restrictions Weight Bearing Restrictions Per Provider Order: No      Mobility  Bed Mobility Overal bed mobility: Needs Assistance Bed Mobility: Supine to Sit     Supine to  sit: Supervision, HOB elevated     General bed mobility comments: min cues    Transfers Overall transfer level: Needs assistance Equipment used: Rolling walker (2 wheels) Transfers: Sit to/from Stand Sit to Stand: Contact guard assist           General transfer comment: min cues    Ambulation/Gait Ambulation/Gait assistance: Contact guard assist Gait Distance (Feet): 60 Feet Assistive device: Rolling walker (2 wheels) Gait Pattern/deviations: Step-to pattern, Decreased stance time - right, Antalgic, Trunk flexed Gait velocity: decreased     General Gait Details: slight trunk flexion with B UE support at RW to offload R LE in stance phase, step almost through pattern, no instabilty noted R LE, min cues for RW management  Stairs            Wheelchair Mobility     Tilt Bed    Modified Rankin (Stroke Patients Only)       Balance Overall balance assessment: Needs assistance Sitting-balance support: Feet supported Sitting balance-Leahy Scale: Good     Standing balance support: Bilateral upper extremity supported, During functional activity, Reliant on assistive device for balance Standing balance-Leahy Scale: Fair Standing balance comment: staitc standing no UE support                             Pertinent Vitals/Pain Pain Assessment Pain Assessment: 0-10 Pain Score: 4  Pain Location: R LE and knee Pain Descriptors / Indicators: Aching, Constant, Discomfort, Grimacing, Operative site guarding (pulling) Pain Intervention(s): Limited activity within patient's tolerance, Monitored during session, Premedicated before session, Repositioned, Ice applied (  iceman machine)    Home Living Family/patient expects to be discharged to:: Private residence Living Arrangements: Spouse/significant other Available Help at Discharge: Family Type of Home: House Home Access: Stairs to enter Entrance Stairs-Rails: None Entrance Stairs-Number of Steps: 3  (nonsequential steps)   Home Layout: One level Home Equipment: Rollator (4 wheels)      Prior Function Prior Level of Function : Independent/Modified Independent;Working/employed;Driving             Mobility Comments: IND no AD for all ADLs, self care tasks and IADLs       Extremity/Trunk Assessment        Lower Extremity Assessment Lower Extremity Assessment: RLE deficits/detail RLE Deficits / Details: ankle DF/PF 5/5; SLR < 10 degree lag RLE Sensation: WNL    Cervical / Trunk Assessment Cervical / Trunk Assessment: Normal  Communication   Communication Communication: No apparent difficulties    Cognition Arousal: Alert Behavior During Therapy: WFL for tasks assessed/performed   PT - Cognitive impairments: No apparent impairments                         Following commands: Intact       Cueing       General Comments      Exercises Total Joint Exercises Ankle Circles/Pumps: AROM, Both, 10 reps   Assessment/Plan    PT Assessment Patient needs continued PT services  PT Problem List Decreased strength;Decreased range of motion;Decreased activity tolerance;Decreased balance;Decreased mobility;Decreased coordination;Pain       PT Treatment Interventions DME instruction;Gait training;Stair training;Functional mobility training;Therapeutic activities;Therapeutic exercise;Balance training;Neuromuscular re-education;Patient/family education;Modalities    PT Goals (Current goals can be found in the Care Plan section)  Acute Rehab PT Goals Patient Stated Goal: to be able to go to the beach in August or September PT Goal Formulation: With patient Time For Goal Achievement: 10/29/23 Potential to Achieve Goals: Good    Frequency 7X/week     Co-evaluation               AM-PAC PT 6 Clicks Mobility  Outcome Measure Help needed turning from your back to your side while in a flat bed without using bedrails?: None Help needed moving from  lying on your back to sitting on the side of a flat bed without using bedrails?: A Little Help needed moving to and from a bed to a chair (including a wheelchair)?: A Little Help needed standing up from a chair using your arms (e.g., wheelchair or bedside chair)?: A Little Help needed to walk in hospital room?: A Little Help needed climbing 3-5 steps with a railing? : A Lot 6 Click Score: 18    End of Session Equipment Utilized During Treatment: Gait belt Activity Tolerance: Patient tolerated treatment well Patient left: in chair;with call bell/phone within reach;with family/visitor present Nurse Communication: Mobility status PT Visit Diagnosis: Unsteadiness on feet (R26.81);Other abnormalities of gait and mobility (R26.89);Muscle weakness (generalized) (M62.81);Difficulty in walking, not elsewhere classified (R26.2);Pain Pain - Right/Left: Right Pain - part of body: Knee;Leg    Time: 8197-8167 PT Time Calculation (min) (ACUTE ONLY): 30 min   Charges:   PT Evaluation $PT Eval Low Complexity: 1 Low PT Treatments $Gait Training: 8-22 mins PT General Charges $$ ACUTE PT VISIT: 1 Visit         [place], PT Acute Rehab   [place] VEAR Drone 10/08/2023, 6:41 PM

## 2023-10-08 NOTE — Interval H&P Note (Signed)
 History and Physical Interval Note: The patient understands that she is here today for right total knee replacement to treat her severe right knee pain and arthritis.  There has been no acute or interval change in her medical status.  The risks and benefits of surgery have been discussed in detail and informed consent has been obtained.  The right operative knee has been marked.  10/08/2023 11:32 AM  Angela Stark  has presented today for surgery, with the diagnosis of Osteoarthritis Right Knee.  The various methods of treatment have been discussed with the patient and family. After consideration of risks, benefits and other options for treatment, the patient has consented to  Procedure(s): ARTHROPLASTY, KNEE, TOTAL (Right) as a surgical intervention.  The patient's history has been reviewed, patient examined, no change in status, stable for surgery.  I have reviewed the patient's chart and labs.  Questions were answered to the patient's satisfaction.     [name]

## 2023-10-08 NOTE — Anesthesia Procedure Notes (Signed)
 Anesthesia Regional Block: Adductor canal block   Pre-Anesthetic Checklist: , timeout performed,  Correct Patient, Correct Site, Correct Laterality,  Correct Procedure, Correct Position, site marked,  Risks and benefits discussed,  Surgical consent,  Pre-op evaluation,  At surgeon's request and post-op pain management  Laterality: Right  Prep: Maximum Sterile Barrier Precautions used, chloraprep       Needles:  Injection technique: Single-shot  Needle Type: Echogenic Stimulator Needle     Needle Length: 9cm  Needle Gauge: 22     Additional Needles:   Procedures:,,,, ultrasound used (permanent image in chart),,    Narrative:  Start time: 10/08/2023 11:55 AM End time: 10/08/2023 12:00 PM Injection made incrementally with aspirations every 5 mL.  Performed by: Personally  Anesthesiologist: Merla Almarie HERO, DO  Additional Notes: Monitors applied. No increased pain on injection. No increased resistance to injection. Injection made in 5cc increments. Good needle visualization. Patient tolerated procedure well.

## 2023-10-08 NOTE — Op Note (Signed)
 Operative Note  Date of operation: 10/08/2023 Preoperative diagnosis: Right knee primary osteoarthritis Postoperative diagnosis: Same  Procedure: Right press-fit total knee arthroplasty  Implants: Biomet/Zimmer persona press-fit knee system Implant Name Type Inv. Item Serial No. Manufacturer Lot No. LRB No. Used Action  INSERT TIBIA KNEE RIGHT 10 - [id number] Joint INSERT TIBIA KNEE RIGHT 10  ZIMMER [employer]ON(ORTH,TRAU,BIO,SG) [id number] Right 1 Implanted  COMPONENT PATELLA PEG 3 32 - [id number] Joint COMPONENT PATELLA PEG 3 32  ZIMMER [employer]ON(ORTH,TRAU,BIO,SG) [id number] Right 1 Implanted  PROSTHESIS TIB KNEE PS 0D F RT - [id number] Joint PROSTHESIS TIB KNEE PS 0D F RT  ZIMMER RECON(ORTH,TRAU,BIO,SG) [id number] Right 1 Implanted  COMPONENT FEM STD PS 8 RT - [id number] Joint COMPONENT FEM STD PS 8 RT  ZIMMER RECON(ORTH,TRAU,BIO,SG) [id number] Right 1 Implanted   Surgeon: Lonni GRADE. Vernetta, MD Assistant: Tory Gaskins, PA-C  Anesthesia: #1 right lower extremity adductor canal block, #2 spinal, #3 local Tourniquet time: Less than 1 hour EBL: Less than 50 cc Antibiotics: IV Ancef Complications: None  Indications: The patient is a 53 year old very active female who unfortunately developed debilitating arthritis involving her right knee.  Her x-rays show bone-on-bone wear the medial compartment and patellofemoral joint with osteophytes in all 3 compartments.  She has tried and failed conservative treatment for many years now.  She has continued to have recurrent effusions of her knee as well.  At this point her right knee pain is daily and it is detrimentally affecting her mobility, her quality of life and her actives daily living to the point she does wish to proceed with a knee replacement and we agree with this as well.  We did discuss in length in detail the risks of acute blood loss anemia, neurovascular injury, fracture, infection, DVT, implant failure and wound healing issues.  She understands that  our goals are hopefully decreased pain, increased mobility and increase quality of life.  Procedure description: After informed consent was obtained and the appropriate right knee was marked, anesthesia obtained a right lower extremity adductor canal block in the holding room.  The patient was then brought to the operating room and set up on the part of table and spinal anesthesia was obtained.  She was laid in supine position on the OR table and a Foley catheter was placed.  A nonsterile tractors placed around her upper right thigh and her right thigh, knee, leg, and ankle were prepped and draped with DuraPrep and sterile drapes including a sterile stockinette.  A timeout was called and she was identified as the correct patient and the correct right knee.  An Esmarch was then used to wrap out the leg and the tourniquet was inflated to 3 mm pressure.  With the knee extended a midline incision was made over the patella and carried proximally distally.  Dissection was carried down to the knee joint and a medial parapatellar arthrotomy was made finding a large joint effusion and significant synovitis in the knee.  With the knee in a flexed position we found complete cartilage loss of the medial compartment of the knee in the patellofemoral joint.  We removed osteophytes from all 3 compartments as well as the ACL and medial lateral meniscus.  We then used an extramedullary based cutting guide for making our proximal tibia cut correcting for varus and valgus and a 7 degree slope.  We made this cut to take 2 mm off the low side.  We then used a intramedullary based cutting guide for our distal femur cut setting  this for a right knee at 5 degrees externally rotated and a 10 mm distal femoral cut.  We made that cut without difficulty and brought the knee back down to full extension and had achieved full extension with a 10 mm extension block.  We then went back to the femur and put a femoral sizing guide based off the  epicondylar axis and Whitesides line.  Based off of this we chose a size 8 femur.  We put a 4-in-1 cutting block for a size 8 femur and made our anterior and posterior cuts 5 our chamfer cuts.  We then impacted the tibia and chose a size F right tibial tray for coverage over the tibial plateau setting the rotation off the tibial tubercle and the femur.  We did our drill hole and keel punch over this and found very hard high-quality bone for press-fit implants.  We then trialed our size F right tibia combined with our size 8 right CR femur.  We trialed a 10 mm right medial congruent poly and insert and we are pleased with range of motion and stability without insert.  We then made a patella cut and drilled 3 holes for a size 32 diameter patella button.  We then put the knee again through range of motion with all trial implants in the knee and we are pleased with the range of motion and stability.  We then removed all trial instrumentation from the knee and irrigate the knee with normal saline solution using pulsatile lavage.  We then placed Marcaine with epinephrine around the arthrotomy.  Next we dried the knee real well and with the knee in a flexed position we placed our press-fit Biomet Zimmer tibial tray for a right knee size F followed by press fitting our size 8 right CR standard femur.  We placed our 10 mm thickness medial congruent polyethylene insert and press-fit our size 32 patella button.  We then let the tourniquet down and hemostasis was obtained with electrocautery.  The arthrotomy was closed with interrupted #1 Vicryl suture followed by 0 Vicryl goes deep tissue and 2-0 Vicryl to close subcutaneous tissue.  The skin was closed with staples.  Well-padded sterile dressing was applied.  The patient was taken the recovery room.  Tory Gaskins, PA-C did assist during the entire case and beginning the end and his assistance was crucial and medically necessary for soft tissue management and retraction, helping  guide implant placement and a layered closure of the wound.

## 2023-10-08 NOTE — Anesthesia Procedure Notes (Signed)
 Procedure Name: MAC Date/Time: 10/08/2023 12:58 PM  Performed by: Nada Corean CROME, CRNAPre-anesthesia Checklist: Patient identified, Emergency Drugs available, Suction available, Patient being monitored and Timeout performed Patient Re-evaluated:Patient Re-evaluated prior to induction Oxygen Delivery Method: Simple face mask Preoxygenation: Pre-oxygenation with 100% oxygen Induction Type: IV induction Placement Confirmation: positive ETCO2 Dental Injury: Teeth and Oropharynx as per pre-operative assessment

## 2023-10-08 NOTE — Progress Notes (Signed)
   10/08/23 2201  BiPAP/CPAP/SIPAP  $ Non-Invasive Home Ventilator  Initial  BiPAP/CPAP/SIPAP Pt Type Adult  BiPAP/CPAP/SIPAP Resmed  Mask Type Nasal mask  Dentures removed? Not applicable  Mask Size Small  Respiratory Rate 16 breaths/min  FiO2 (%) 21 %  Patient Home Machine No  Patient Home Mask Yes  Patient Home Tubing Yes  Auto Titrate Yes  Minimum cmH2O 7 cmH2O  Maximum cmH2O 15 cmH2O  CPAP/SIPAP surface wiped down Yes  Device Plugged into RED Power Outlet Yes  BiPAP/CPAP /SiPAP Vitals  Pulse Rate (!) 58  Resp 16  SpO2 96 %  Bilateral Breath Sounds Diminished;Clear  MEWS Score/Color  MEWS Score 0  MEWS Score Color Landy

## 2023-10-09 ENCOUNTER — Other Ambulatory Visit (HOSPITAL_COMMUNITY): Payer: Self-pay

## 2023-10-09 DIAGNOSIS — M1711 Unilateral primary osteoarthritis, right knee: Secondary | ICD-10-CM | POA: Diagnosis not present

## 2023-10-09 LAB — BASIC METABOLIC PANEL WITH GFR
Anion gap: 12 (ref 5–15)
BUN: 9 mg/dL (ref 6–20)
CO2: 27 mmol/L (ref 22–32)
Calcium: 8.8 mg/dL — ABNORMAL LOW (ref 8.9–10.3)
Chloride: 96 mmol/L — ABNORMAL LOW (ref 98–111)
Creatinine, Ser: 0.67 mg/dL (ref 0.44–1.00)
GFR, Estimated: >60 mL/min (ref >60)
Glucose, Bld: 153 mg/dL — ABNORMAL HIGH (ref 70–99)
Potassium: 3.6 mmol/L (ref 3.5–5.1)
Sodium: 135 mmol/L (ref 135–145)

## 2023-10-09 LAB — CBC
HCT: 40.7 % (ref 36.0–46.0)
Hemoglobin: 13.9 g/dL (ref 12.0–15.0)
MCH: 31.2 pg (ref 26.0–34.0)
MCHC: 34.2 g/dL (ref 30.0–36.0)
MCV: 91.5 fL (ref 80.0–100.0)
Platelets: 244 K/uL (ref 150–400)
RBC: 4.45 MIL/uL (ref 3.87–5.11)
RDW: 12 % (ref 11.5–15.5)
WBC: 13.5 K/uL — ABNORMAL HIGH (ref 4.0–10.5)
nRBC: 0 % (ref 0.0–0.2)

## 2023-10-09 MED ORDER — OXYCODONE HCL 5 MG PO TABS
5.0000 mg | ORAL_TABLET | ORAL | 0 refills | Status: DC | PRN
  Filled 2023-10-09: qty 30, 3d supply, fill #0

## 2023-10-09 MED ORDER — ASPIRIN 81 MG PO CHEW
81.0000 mg | CHEWABLE_TABLET | Freq: Two times a day (BID) | ORAL | 0 refills | Status: AC
  Filled 2023-10-09: qty 30, 15d supply, fill #0

## 2023-10-09 MED ORDER — TIZANIDINE HCL 4 MG PO TABS
4.0000 mg | ORAL_TABLET | Freq: Four times a day (QID) | ORAL | 0 refills | Status: DC | PRN
  Filled 2023-10-09: qty 30, 8d supply, fill #0

## 2023-10-09 NOTE — TOC Transition Note (Signed)
 Transition of Care [facility]) - Discharge Note   Patient Details  Name: Angela Stark MRN: [id number] Date of Birth: May 18, 1970  [facility] CM/SW Contact:  Heather DELENA Saltness, LCSW Phone Number: 10/09/2023, 12:37 PM   Clinical Narrative:    CSW submitted OPPT referral to [facility] OPPT provider in [facility]. RW has been delivered to bedside. No further TOC needs at this time.   Final next level of care: Home/Self Care Barriers to Discharge: Barriers Resolved   Patient Goals and CMS Choice Patient states their goals for this hospitalization and ongoing recovery are:: To return home          Discharge Placement  Home                     Discharge Plan and Services Additional resources added to the After Visit Summary for N/A In-house Referral: Clinical Social Work   Post Acute Care Choice: Home Health          DME Arranged: Vannie rolling DME Agency: [employer] Date DME Agency Contacted: 10/09/23 Time DME Agency Contacted: [phone] Representative spoke with at DME Agency: [place] HH Arranged: NA HH Agency: NA        Social Drivers of Health (SDOH) Interventions SDOH Screenings   Food Insecurity: No Food Insecurity (10/08/2023)  Housing: Low Risk  (10/08/2023)  Transportation Needs: No Transportation Needs (10/08/2023)  Utilities: Not At Risk (10/08/2023)  Depression (PHQ2-9): Low Risk  (02/19/2022)  Tobacco Use: Medium Risk (10/08/2023)     Readmission Risk Interventions     No data to display           Heather Saltness, MSW, LCSW 10/09/2023 12:38 PM

## 2023-10-09 NOTE — Progress Notes (Signed)
 Subjective: 1 Day Post-Op Procedure(s) (LRB): ARTHROPLASTY, KNEE, TOTAL (Right) Patient reports pain as moderate.    Objective: Vital signs in last 24 hours: Temp:  [97.8 F (36.6 C)-98.7 F (37.1 C)] 97.8 F (36.6 C) (07/19 0554) Pulse Rate:  [46-65] 52 (07/19 0554) Resp:  [11-18] 16 (07/19 0554) BP: (114-172)/(69-88) 129/74 (07/19 0554) SpO2:  [92 %-98 %] 98 % (07/19 0554) FiO2 (%):  [21 %] 21 % (07/18 2201) Weight:  [108 kg-111.6 kg] 111.6 kg (07/18 2300)  Intake/Output from previous day: 07/18 0701 - 07/19 0700 In: 3024.1 [P.O.:600; I.V.:2124.1; IV Piggyback:300] Out: 4925 [Urine:4825; Blood:100] Intake/Output this shift: Total I/O In: 240 [P.O.:240] Out: -   Recent Labs    10/09/23 0336  HGB 13.9   Recent Labs    10/09/23 0336  WBC 13.5*  RBC 4.45  HCT 40.7  PLT 244   Recent Labs    10/09/23 0336  NA 135  K 3.6  CL 96*  CO2 27  BUN 9  CREATININE 0.67  GLUCOSE 153*  CALCIUM  8.8*   No results for input(s): LABPT, INR in the last 72 hours.  Sensation intact distally Intact pulses distally Dorsiflexion/Plantar flexion intact Incision: dressing C/D/I Compartment soft   Assessment/Plan: 1 Day Post-Op Procedure(s) (LRB): ARTHROPLASTY, KNEE, TOTAL (Right) Up with therapy Discharge to home today.     Angela Stark 10/09/2023, 10:45 AM

## 2023-10-09 NOTE — Discharge Instructions (Signed)
 Per [facility] clinic policy, our goal is ensure optimal postoperative pain control with a multimodal pain management strategy. For all [facility] patients, our goal is to wean post-operative narcotic medications by 6 weeks post-operatively. If this is not possible due to utilization of pain medication prior to surgery, your [facility] doctor will support your acute post-operative pain control for the first 6 weeks postoperatively, with a plan to transition you back to your primary pain team following that. Angela Stark will work to ensure a Therapist, occupational.  INSTRUCTIONS AFTER JOINT REPLACEMENT   Remove items at home which could result in a fall. This includes throw rugs or furniture in walking pathways ICE to the affected joint every three hours while awake for 30 minutes at a time, for at least the first 3-5 days, and then as needed for pain and swelling.  Continue to use ice for pain and swelling. You may notice swelling that will progress down to the foot and ankle.  This is normal after surgery.  Elevate your leg when you are not up walking on it.   Continue to use the breathing machine you got in the hospital (incentive spirometer) which will help keep your temperature down.  It is common for your temperature to cycle up and down following surgery, especially at night when you are not up moving around and exerting yourself.  The breathing machine keeps your lungs expanded and your temperature down.   DIET:  As you were doing prior to hospitalization, we recommend a well-balanced diet.  DRESSING / WOUND CARE / SHOWERING  Keep the surgical dressing until follow up.  The dressing is water proof, so you can shower without any extra covering.  IF THE DRESSING FALLS OFF or the wound gets wet inside, change the dressing with sterile gauze.  Please use good hand washing techniques before changing the dressing.  Do not use any lotions or creams on the incision until instructed by your surgeon.     ACTIVITY  Increase activity slowly as tolerated, but follow the weight bearing instructions below.   No driving for 6 weeks or until further direction given by your physician.  You cannot drive while taking narcotics.  No lifting or carrying greater than 10 lbs. until further directed by your surgeon. Avoid periods of inactivity such as sitting longer than an hour when not asleep. This helps prevent blood clots.  You may return to work once you are authorized by your doctor.     WEIGHT BEARING   Weight bearing as tolerated with assist device (walker, cane, etc) as directed, use it as long as suggested by your surgeon or therapist, typically at least 4-6 weeks.   EXERCISES  Results after joint replacement surgery are often greatly improved when you follow the exercise, range of motion and muscle strengthening exercises prescribed by your doctor. Safety measures are also important to protect the joint from further injury. Any time any of these exercises cause you to have increased pain or swelling, decrease what you are doing until you are comfortable again and then slowly increase them. If you have problems or questions, call your caregiver or physical therapist for advice.   Rehabilitation is important following a joint replacement. After just a few days of immobilization, the muscles of the leg can become weakened and shrink (atrophy).  These exercises are designed to build up the tone and strength of the thigh and leg muscles and to improve motion. Often times heat used for twenty to thirty minutes before  working out will loosen up your tissues and help with improving the range of motion but do not use heat for the first two weeks following surgery (sometimes heat can increase post-operative swelling).   These exercises can be done on a training (exercise) mat, on the floor, on a table or on a bed. Use whatever works the best and is most comfortable for you.    Use music or television  while you are exercising so that the exercises are a pleasant break in your day. This will make your life better with the exercises acting as a break in your routine that you can look forward to.   Perform all exercises about fifteen times, three times per day or as directed.  You should exercise both the operative leg and the other leg as well.  Exercises include:   Quad Sets - Tighten up the muscle on the front of the thigh (Quad) and hold for 5-10 seconds.   Straight Leg Raises - With your knee straight (if you were given a brace, keep it on), lift the leg to 60 degrees, hold for 3 seconds, and slowly lower the leg.  Perform this exercise against resistance later as your leg gets stronger.  Leg Slides: Lying on your back, slowly slide your foot toward your buttocks, bending your knee up off the floor (only go as far as is comfortable). Then slowly slide your foot back down until your leg is flat on the floor again.  Angel Wings: Lying on your back spread your legs to the side as far apart as you can without causing discomfort.  Hamstring Strength:  Lying on your back, push your heel against the floor with your leg straight by tightening up the muscles of your buttocks.  Repeat, but this time bend your knee to a comfortable angle, and push your heel against the floor.  You may put a pillow under the heel to make it more comfortable if necessary.   A rehabilitation program following joint replacement surgery can speed recovery and prevent re-injury in the future due to weakened muscles. Contact your doctor or a physical therapist for more information on knee rehabilitation.    CONSTIPATION  Constipation is defined medically as fewer than three stools per week and severe constipation as less than one stool per week.  Even if you have a regular bowel pattern at home, your normal regimen is likely to be disrupted due to multiple reasons following surgery.  Combination of anesthesia, postoperative  narcotics, change in appetite and fluid intake all can affect your bowels.   YOU MUST use at least one of the following options; they are listed in order of increasing strength to get the job done.  They are all available over the counter, and you may need to use some, POSSIBLY even all of these options:    Drink plenty of fluids (prune juice may be helpful) and high fiber foods Colace 100 mg by mouth twice a day  Senokot for constipation as directed and as needed Dulcolax (bisacodyl), take with full glass of water  Miralax (polyethylene glycol) once or twice a day as needed.  If you have tried all these things and are unable to have a bowel movement in the first 3-4 days after surgery call either your surgeon or your primary doctor.    If you experience loose stools or diarrhea, hold the medications until you stool forms back up.  If your symptoms do not get better within 1 week  or if they get worse, check with your doctor.  If you experience "the worst abdominal pain ever" or develop nausea or vomiting, please contact the office immediately for further recommendations for treatment.   ITCHING:  If you experience itching with your medications, try taking only a single pain pill, or even half a pain pill at a time.  You can also use Benadryl over the counter for itching or also to help with sleep.   TED HOSE STOCKINGS:  Use stockings on both legs until for at least 2 weeks or as directed by physician office. They may be removed at night for sleeping.  MEDICATIONS:  See your medication summary on the "After Visit Summary" that nursing will review with you.  You may have some home medications which will be placed on hold until you complete the course of blood thinner medication.  It is important for you to complete the blood thinner medication as prescribed.  PRECAUTIONS:  If you experience chest pain or shortness of breath - call 911 immediately for transfer to the hospital emergency department.    If you develop a fever greater that 101 F, purulent drainage from wound, increased redness or drainage from wound, foul odor from the wound/dressing, or calf pain - CONTACT YOUR SURGEON.                                                   FOLLOW-UP APPOINTMENTS:  If you do not already have a post-op appointment, please call the office for an appointment to be seen by your surgeon.  Guidelines for how soon to be seen are listed in your "After Visit Summary", but are typically between 1-4 weeks after surgery.  OTHER INSTRUCTIONS:   Knee Replacement:  Do not place pillow under knee, focus on keeping the knee straight while resting. CPM instructions: 0-90 degrees, 2 hours in the morning, 2 hours in the afternoon, and 2 hours in the evening. Place foam block, curve side up under heel at all times except when in CPM or when walking.  DO NOT modify, tear, cut, or change the foam block in any way.  POST-OPERATIVE OPIOID TAPER INSTRUCTIONS: It is important to wean off of your opioid medication as soon as possible. If you do not need pain medication after your surgery it is ok to stop day one. Opioids include: Codeine, Hydrocodone(Norco, Vicodin), Oxycodone(Percocet, oxycontin) and hydromorphone amongst others.  Long term and even short term use of opiods can cause: Increased pain response Dependence Constipation Depression Respiratory depression And more.  Withdrawal symptoms can include Flu like symptoms Nausea, vomiting And more Techniques to manage these symptoms Hydrate well Eat regular healthy meals Stay active Use relaxation techniques(deep breathing, meditating, yoga) Do Not substitute Alcohol to help with tapering If you have been on opioids for less than two weeks and do not have pain than it is ok to stop all together.  Plan to wean off of opioids This plan should start within one week post op of your joint replacement. Maintain the same interval or time between taking each dose  and first decrease the dose.  Cut the total daily intake of opioids by one tablet each day Next start to increase the time between doses. The last dose that should be eliminated is the evening dose.   MAKE SURE YOU:  Understand these instructions.  Get help right away if you are not doing well or get worse.    Thank you for letting us be a part of your medical care team.  It is a privilege we respect greatly.  We hope these instructions will help you stay on track for a fast and full recovery!      Dental Antibiotics:  In most cases prophylactic antibiotics for Dental procdeures after total joint surgery are not necessary.  Exceptions are as follows:  1. History of prior total joint infection  2. Severely immunocompromised (Organ Transplant, cancer chemotherapy, Rheumatoid biologic meds such as Humera)  3. Poorly controlled diabetes (A1C &gt; 8.0, blood glucose over 200)  If you have one of these conditions, contact your surgeon for an antibiotic prescription, prior to your dental procedure.

## 2023-10-09 NOTE — TOC Initial Note (Signed)
 Transition of Care [facility]) - Initial/Assessment Note    Patient Details  Name: Angela Stark MRN: [id number] Date of Birth: 1970/03/31  Transition of Care [facility]) CM/SW Contact:    Angela DELENA Saltness, LCSW Phone Number: 10/09/2023, 9:46 AM  Clinical Narrative:                 CSW met with pt at bedside to discuss discharge planning. CSW advised pt of PT's recommendation for [facility] PT services upon discharge. Pt reports her husband will be staying at home with pt full-time for duration of her recovery. Pt reports interest in outpatient PT services because it may be cheaper than HH. PT to re-evaluate today. PT also recommended RW. RW ordered through [employer]. RW to be delivered to bedside. TOC will continue to follow for needs.    Expected Discharge Plan: Home w Home Health Services Barriers to Discharge: Continued Medical Work up   Patient Goals and CMS Choice Patient states their goals for this hospitalization and ongoing recovery are:: To return home        Expected Discharge Plan and Services In-house Referral: Clinical Social Work   Post Acute Care Choice: Home Health Living arrangements for the past 2 months: Single Family Home                 DME Arranged: Walker rolling DME Agency: [employer] Date DME Agency Contacted: 10/09/23 Time DME Agency Contacted: [phone] Representative spoke with at DME Agency: Angela Stark           Prior Living Arrangements/Services Living arrangements for the past 2 months: Single Family Home Lives with:: Self, Spouse Patient language and need for interpreter reviewed:: Yes Do you feel safe going back to the place where you live?: Yes      Need for Family Participation in Patient Care: Yes (Comment) Care giver support system in place?: Yes (comment)   Criminal Activity/Legal Involvement Pertinent to Current Situation/Hospitalization: No - Comment as needed  Activities of Daily Living   ADL Screening (condition at time of  admission) Independently performs ADLs?: Yes (appropriate for developmental age) Is the patient deaf or have difficulty hearing?: No Does the patient have difficulty seeing, even when wearing glasses/contacts?: No Does the patient have difficulty concentrating, remembering, or making decisions?: No  Permission Sought/Granted   Permission granted to share information with : Yes, Verbal Permission Granted  Share Information with NAME: Angela Stark  Permission granted to share info w AGENCY: [employer]  Permission granted to share info w Relationship: Spouse  Permission granted to share info w Contact Information: [phone]  Emotional Assessment Appearance:: Appears stated age, Well-Groomed Attitude/Demeanor/Rapport: Engaged Affect (typically observed): Pleasant, Hopeful, Appropriate, Adaptable Orientation: : Oriented to Self, Oriented to Place, Oriented to  Time, Oriented to Situation Alcohol / Substance Use: Not Applicable Psych Involvement: No (comment)  Admission diagnosis:  Primary osteoarthritis of right knee [M17.11] Status post total right knee replacement [Z96.651] Patient Active Problem List   Diagnosis Date Noted   Status post total right knee replacement 10/08/2023   Unilateral primary osteoarthritis, right knee 10/07/2023   Bell's palsy 05/10/2023   Obstructive sleep apnea (adult) (pediatric) 05/10/2023   Anemia    Arthritis    Back pain    GERD (gastroesophageal reflux disease)    Hyperlipidemia    Joint pain    Muscle stiffness    Stress    Swelling of both lower extremities    Trouble in sleeping    Morbid obesity with BMI  of 40.0-44.9, adult (HCC) 03/22/2023   Palpitations 03/22/2023   Grieving 02/19/2022   Herpes zoster without complication 02/19/2022   Need for shingles vaccine 11/10/2021   Thyroid  nodule 08/09/2020   Heart murmur 08/09/2020   Phlebitis 11/03/2019   Elevated LDL cholesterol level 11/03/2019   Primary osteoarthritis of both knees  01/12/2018   Right knee pain 10/19/2017   Acute pain of left knee 10/19/2017   Other fatigue 10/18/2017   Shortness of breath on exertion 10/18/2017   Steatosis of liver 09/06/2017   Apnea 05/28/2017   Amenorrhea 05/28/2017   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   Class 3 severe obesity due to excess calories with body mass index (BMI) of 40.0 to 44.9 in adult 08/27/2016   Patellofemoral arthritis of left knee 09/05/2015   Right ankle pain 09/05/2015   Acquired supination of foot 09/05/2015   Essential hypertension 07/30/2015   Visual disturbance 07/30/2015   Right upper quadrant pain 07/30/2015   Vision disturbance 04/16/2015   Tobacco use disorder 04/16/2015   Screening for diabetes mellitus 11/30/2014   Encounter for screening mammogram for breast cancer 08/06/2014   Encounter for smoking cessation counseling 08/06/2014   Alcohol abuse 08/19/2011   Alcoholic hepatitis 08/18/2011   Dehydration 08/18/2011   ARF (acute renal failure) (HCC) 08/18/2011   Hypokalemia 08/18/2011   Hyponatremia 08/18/2011   Thrombocytopenia (HCC) 08/18/2011   Polysubstance dependence including opioid type drug, episodic abuse (HCC) 08/18/2011   Anxiety and depression 07/26/2006   PCP:  Angela Elsie Sayre, MD Pharmacy:   Angela Stark - [facility] [address] [place] [address] Phone: [phone] Fax: [phone]  [facility] - [place], [place] - [phone] [place] [id number] Phone: [phone] Fax: [phone]     Social Drivers of Health (SDOH) Social History: SDOH Screenings   Food Insecurity: No Food Insecurity (10/08/2023)  Housing: Low Risk  (10/08/2023)  Transportation Needs: No Transportation Needs (10/08/2023)  Utilities: Not At Risk (10/08/2023)  Depression (PHQ2-9): Low Risk  (02/19/2022)  Tobacco Use: Medium Risk (10/08/2023)   SDOH Interventions: None indicated     Readmission Risk  Interventions     No data to display          Angela Stark, MSW, LCSW 10/09/2023 9:49 AM

## 2023-10-09 NOTE — Progress Notes (Signed)
 Physical Therapy Treatment Patient Details Name: Angela Stark MRN: [id number] DOB: 26-Jan-1971 Today's Date: 10/09/2023   History of Present Illness 53 yo female presents to therapy s/p R TKA on 10/08/2023 due to failure of conservative measures. Pt PMH includes but is not limited to: bell's palsy (04/2023), GERD, HLD, DOE, HTN, anxiety and depression, alcoholic hepatitis, anemia, ARF, LBP, and OSA.    PT Comments  Pt ambulated in hallway and practiced safe stair technique.  Pt also performed LE exercises.  Pt mobilizing very well and inquiring about OPPT, which would be appropriate follow up upon d/c.  Pt feels ready for d/c home today.   Pt had no further questions and provided with HEP handout.     If plan is discharge home, recommend the following: A little help with walking and/or transfers;A little help with bathing/dressing/bathroom;Assistance with cooking/housework;Assist for transportation;Help with stairs or ramp for entrance   Can travel by private vehicle        Equipment Recommendations  Rolling walker (2 wheels)    Recommendations for Other Services       Precautions / Restrictions Precautions Precautions: Knee;Fall Restrictions RLE Weight Bearing Per Provider Order: Weight bearing as tolerated     Mobility  Bed Mobility Overal bed mobility: Needs Assistance Bed Mobility: Supine to Sit, Sit to Supine     Supine to sit: Supervision, HOB elevated Sit to supine: Supervision, HOB elevated        Transfers Overall transfer level: Needs assistance Equipment used: Rolling walker (2 wheels) Transfers: Sit to/from Stand Sit to Stand: Supervision                Ambulation/Gait Ambulation/Gait assistance: Contact guard assist, Supervision Gait Distance (Feet): 160 Feet Assistive device: Rolling walker (2 wheels) Gait Pattern/deviations: Decreased stride length, Step-through pattern       General Gait Details: verbal cues for sequence, RW  positioning, step length, pt not presenting with antalgic gait pattern, reciprocal and smooth swing and stance phases   Stairs Stairs: Yes Stairs assistance: Contact guard assist Stair Management: Forwards, With walker, Step to pattern Number of Stairs: 2 General stair comments: verbal cues for sequence and safety; performed one step with RW and 2 steps with RW; pt reports understanding; plans to have husbands assist/support at home   Wheelchair Mobility     Tilt Bed    Modified Rankin (Stroke Patients Only)       Balance                                            Communication Communication Communication: No apparent difficulties  Cognition Arousal: Alert Behavior During Therapy: WFL for tasks assessed/performed   PT - Cognitive impairments: No apparent impairments                         Following commands: Intact      Cueing    Exercises Total Joint Exercises Ankle Circles/Pumps: AROM, Both, 10 reps Quad Sets: AROM, Both, 10 reps Heel Slides: AAROM, Right, 10 reps Hip ABduction/ADduction: AAROM, Right, 10 reps Straight Leg Raises: AAROM, Right, 10 reps Knee Flexion: AROM, Seated, Right, 10 reps    General Comments        Pertinent Vitals/Pain Pain Assessment Pain Assessment: 0-10 Pain Score: 4  Pain Location: R LE and knee Pain Descriptors / Indicators: Aching, Sore  Pain Intervention(s): Repositioned, Premedicated before session, Ice applied, Monitored during session    Home Living                          Prior Function            PT Goals (current goals can now be found in the care plan section) Progress towards PT goals: Progressing toward goals    Frequency           PT Plan      Co-evaluation              AM-PAC PT 6 Clicks Mobility   Outcome Measure  Help needed turning from your back to your side while in a flat bed without using bedrails?: None Help needed moving from lying on  your back to sitting on the side of a flat bed without using bedrails?: None Help needed moving to and from a bed to a chair (including a wheelchair)?: A Little Help needed standing up from a chair using your arms (e.g., wheelchair or bedside chair)?: A Little Help needed to walk in hospital room?: A Little Help needed climbing 3-5 steps with a railing? : A Little 6 Click Score: 20    End of Session Equipment Utilized During Treatment: Gait belt Activity Tolerance: Patient tolerated treatment well Patient left: with call bell/phone within reach;in bed Nurse Communication: Mobility status PT Visit Diagnosis: Other abnormalities of gait and mobility (R26.89)     Time: 8992-8966 PT Time Calculation (min) (ACUTE ONLY): 26 min  Charges:    $Gait Training: 8-22 mins $Therapeutic Exercise: 8-22 mins PT General Charges $$ ACUTE PT VISIT: 1 Visit                    Angela Stark, DPT Physical Therapist Acute Rehabilitation Services Office: [phone]   Angela Stark 10/09/2023, 1:37 PM

## 2023-10-10 NOTE — Discharge Summary (Signed)
 Patient ID: AVELEEN Stark MRN: [id number] DOB/AGE: Dec 11, 1970 53 y.o.  Admit date: 10/08/2023 Discharge date: 10/09/23  Admission Diagnoses:  Principal Problem:   Unilateral primary osteoarthritis, right knee Active Problems:   Status post total right knee replacement   Discharge Diagnoses:  Same  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   Amenorrhea 05/28/2017   Anemia    Anxiety and depression 07/26/2006   Qualifier: Diagnosis of   By: Lorane Browning         Apnea 05/28/2017   ARF (acute renal failure) (HCC) 08/18/2011   Arthritis    Back pain    Bell's palsy 05/10/2023   Class 3 severe obesity due to excess calories with body mass index (BMI) of 40.0 to 44.9 in adult 08/27/2016   Dehydration 08/18/2011   Essential hypertension 07/30/2015   Fatty liver    GERD (gastroesophageal reflux disease)    Heart murmur    When pregnant   Hyperlipidemia    Hypertension    Hypokalemia 08/18/2011   Hyponatremia 08/18/2011   Joint pain    Knee pain    Muscle stiffness    Need for shingles vaccine 11/10/2021   Obstructive sleep apnea (adult) (pediatric) 05/10/2023   Other fatigue 10/18/2017   Palpitations 03/22/2023   Patellofemoral arthritis of left knee 09/05/2015   Phlebitis    Polysubstance dependence including opioid type drug, episodic abuse (HCC) 08/18/2011   Primary osteoarthritis of both knees 01/12/2018   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   Right ankle pain 09/05/2015   Right knee pain 10/19/2017   Right upper quadrant pain 07/30/2015   Screening for diabetes mellitus 11/30/2014   Shortness of breath on exertion    Sleep apnea    Steatosis of liver 09/06/2017   Stress    Swelling of both lower extremities    Thrombocytopenia (HCC) 08/18/2011   Thyroid  nodule 08/09/2020   Tobacco use disorder 04/16/2015    Chronic      Trouble in sleeping    Vision disturbance 04/16/2015   1/17 ?etiology      Surgeries: Procedure(s): ARTHROPLASTY, KNEE, TOTAL on 10/08/2023   Consultants:   Discharged Condition: Improved  Hospital Course: Angela Stark is an 53 y.o. female who was admitted 10/08/2023 for operative treatment ofUnilateral primary osteoarthritis, right knee. Patient has severe unremitting pain that affects sleep, daily activities, and work/hobbies. After pre-op clearance the patient was taken to the operating room on 10/08/2023 and underwent  Procedure(s): ARTHROPLASTY, KNEE, TOTAL.    Patient was given perioperative antibiotics:  Anti-infectives (From admission, onward)    Start     Dose/Rate Route Frequency Ordered Stop   10/08/23 1930  ceFAZolin  (ANCEF ) IVPB 2g/100 mL premix        2 g 200 mL/hr over 30 Minutes Intravenous Every 6 hours 10/08/23 1635 10/09/23 0102   10/08/23 1100  ceFAZolin  (ANCEF ) IVPB 2g/100 mL premix        2 g 200 mL/hr over 30 Minutes Intravenous On call to O.R. 10/08/23 1053 10/08/23 1322        Patient was given sequential compression devices, early ambulation, and chemoprophylaxis to prevent DVT.  Patient benefited maximally from hospital stay and there were no complications.    Recent vital signs: No data found.   Recent laboratory studies:  Recent Labs    10/09/23 0336  WBC 13.5*  HGB 13.9  HCT 40.7  PLT 244  NA 135  K 3.6  CL 96*  CO2 27  BUN 9  CREATININE 0.67  GLUCOSE 153*  CALCIUM  8.8*     Discharge Medications:   Allergies as of 10/09/2023   No Known Allergies      Medication List     TAKE these medications    amLODipine  10 MG tablet Commonly known as: NORVASC  Take 1 tablet (10 mg total) by mouth daily.   Aspirin  Low Dose 81 MG chewable tablet Generic drug: aspirin  Chew 1 tablet (81 mg total) by mouth 2 (two) times daily.   atorvastatin  20 MG tablet Commonly known as: LIPITOR Take 1 tablet (20 mg total) by  mouth daily.   buPROPion  150 MG 24 hr tablet Commonly known as: WELLBUTRIN  XL TAKE 1 TABLET BY MOUTH DAILY.   chlorhexidine  4 % external liquid Commonly known as: HIBICLENS  Apply 15 mLs (1 Application total) topically as directed for 30 doses. Use as directed daily for 5 days every other week for 6 weeks.   chlorthalidone  25 MG tablet Commonly known as: HYGROTON  Take 1 tablet (25 mg total) by mouth daily.   multivitamin with minerals Tabs tablet Take 1 tablet by mouth daily.   mupirocin  ointment 2 % Commonly known as: BACTROBAN  Place 1 Application into the nose 2 (two) times daily for 60 doses. Use as directed 2 times daily for 5 days every other week for 6 weeks.   naproxen sodium 220 MG tablet Commonly known as: ALEVE Take 440 mg by mouth daily as needed (pain).   oxyCODONE  5 MG immediate release tablet Commonly known as: Oxy IR/ROXICODONE  Take 1-2 tablets (5-10 mg total) by mouth every 4 (four) hours as needed for moderate pain (pain score 4-6) (pain score 4-6).   potassium chloride  SA 20 MEQ tablet Commonly known as: KLOR-CON  M Take 20 mEq by mouth once a week.   tirzepatide  7.5 MG/0.5ML injection vial Inject 7.5 mg into the skin once a week.   tiZANidine  4 MG tablet Commonly known as: ZANAFLEX  Take 1 tablet (4 mg total) by mouth every 6 (six) hours as needed for muscle spasms.        Diagnostic Studies: DG Knee Right Port Result Date: 10/08/2023 CLINICAL DATA:  Status post knee replacement EXAM: PORTABLE RIGHT KNEE - 2 VIEW COMPARISON:  None Available. FINDINGS: Total knee arthroplasty. Press-Fit femoral and tibial component. Patellar button. Midline anterior skin staples. Soft tissue gas seen. Air in fluid in the joint space as well on the lateral view. Expected alignment. Imaging was obtained to aid in treatment. IMPRESSION: Acute surgical changes of total knee arthroplasty. Electronically Signed   By: Ranell Bring M.D.   On: 10/08/2023 14:58    Disposition:  Discharge disposition: 01-Home or Self Care       Discharge Instructions     Ambulatory referral to Physical Therapy   Complete by: As directed         Follow-up Information     Vernetta Lonni GRADE, MD Follow up in 2 week(s).   Specialty: Orthopedic Surgery Contact information: [address] [place]  [place] [address] [phone]                  Signed: Lonni GRADE Vernetta 10/10/2023, 7:31 AM

## 2023-10-11 ENCOUNTER — Encounter: Payer: Self-pay | Admitting: Orthopaedic Surgery

## 2023-10-11 ENCOUNTER — Other Ambulatory Visit (HOSPITAL_COMMUNITY): Payer: Self-pay

## 2023-10-11 ENCOUNTER — Encounter (HOSPITAL_COMMUNITY): Payer: Self-pay | Admitting: Orthopaedic Surgery

## 2023-10-12 ENCOUNTER — Other Ambulatory Visit: Payer: Self-pay | Admitting: Radiology

## 2023-10-12 ENCOUNTER — Encounter: Payer: Self-pay | Admitting: Rehabilitative and Restorative Service Providers"

## 2023-10-12 ENCOUNTER — Ambulatory Visit: Admitting: Rehabilitative and Restorative Service Providers"

## 2023-10-12 DIAGNOSIS — R262 Difficulty in walking, not elsewhere classified: Secondary | ICD-10-CM | POA: Diagnosis not present

## 2023-10-12 DIAGNOSIS — R600 Localized edema: Secondary | ICD-10-CM | POA: Diagnosis not present

## 2023-10-12 DIAGNOSIS — M25561 Pain in right knee: Secondary | ICD-10-CM | POA: Diagnosis not present

## 2023-10-12 DIAGNOSIS — M25661 Stiffness of right knee, not elsewhere classified: Secondary | ICD-10-CM

## 2023-10-12 DIAGNOSIS — M6281 Muscle weakness (generalized): Secondary | ICD-10-CM

## 2023-10-12 DIAGNOSIS — Z96651 Presence of right artificial knee joint: Secondary | ICD-10-CM

## 2023-10-12 DIAGNOSIS — G8929 Other chronic pain: Secondary | ICD-10-CM

## 2023-10-12 NOTE — Addendum Note (Signed)
 Addended by: BRIEN SHARPER B on: 10/12/2023 04:48 PM   Modules accepted: Orders

## 2023-10-12 NOTE — Therapy (Addendum)
 OUTPATIENT PHYSICAL THERAPY EVALUATION   Patient Name: Angela Stark MRN: [id number] DOB:[birth year], 53 y.o., female Today's Date: 10/12/2023  END OF SESSION:  PT End of Session - 10/12/23 1605     Visit Number 1    Number of Visits 20    Date for PT Re-Evaluation 12/21/23    Authorization Type [facility] [employer] $25 copay    Progress Note Due on Visit 10    PT Start Time 1605    PT Stop Time 1645    PT Time Calculation (min) 40 min    Activity Tolerance Patient tolerated treatment well    Behavior During Therapy [facility] for tasks assessed/performed          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   Amenorrhea 05/28/2017   Anemia    Anxiety and depression 07/26/2006   Qualifier: Diagnosis of   By: Lorane Browning         Apnea 05/28/2017   ARF (acute renal failure) (HCC) 08/18/2011   Arthritis    Back pain    Bell's palsy 05/10/2023   Class 3 severe obesity due to excess calories with body mass index (BMI) of 40.0 to 44.9 in adult 08/27/2016   Dehydration 08/18/2011   Essential hypertension 07/30/2015   Fatty liver    GERD (gastroesophageal reflux disease)    Heart murmur    When pregnant   Hyperlipidemia    Hypertension    Hypokalemia 08/18/2011   Hyponatremia 08/18/2011   Joint pain    Knee pain    Muscle stiffness    Need for shingles vaccine 11/10/2021   Obstructive sleep apnea (adult) (pediatric) 05/10/2023   Other fatigue 10/18/2017   Palpitations 03/22/2023   Patellofemoral arthritis of left knee 09/05/2015   Phlebitis    Polysubstance dependence including opioid type drug, episodic abuse (HCC) 08/18/2011   Primary osteoarthritis of both knees 01/12/2018   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   Right ankle pain 09/05/2015   Right knee pain 10/19/2017   Right upper quadrant pain 07/30/2015   Screening for diabetes  mellitus 11/30/2014   Shortness of breath on exertion    Sleep apnea    Steatosis of liver 09/06/2017   Stress    Swelling of both lower extremities    Thrombocytopenia (HCC) 08/18/2011   Thyroid  nodule 08/09/2020   Tobacco use disorder 04/16/2015   Chronic      Trouble in sleeping    Vision disturbance 04/16/2015   1/17 ?etiology     Past Surgical History:  Procedure Laterality Date   COLONOSCOPY     OVARY SURGERY     TOTAL KNEE ARTHROPLASTY Right 10/08/2023   Procedure: ARTHROPLASTY, KNEE, TOTAL;  Surgeon: Vernetta Lonni GRADE, MD;  Location: [facility];  Service: Orthopedics;  Laterality: Right;   WISDOM TOOTH EXTRACTION     Patient Active Problem List   Diagnosis Date Noted   Status post total right knee replacement 10/08/2023   Unilateral primary osteoarthritis, right knee 10/07/2023   Bell's palsy 05/10/2023   Obstructive sleep apnea (adult) (pediatric) 05/10/2023   Anemia    Arthritis    Back pain    GERD (gastroesophageal reflux disease)    Hyperlipidemia    Joint pain    Muscle stiffness    Stress    Swelling of both lower extremities    Trouble in sleeping  Morbid obesity with BMI of 40.0-44.9, adult (HCC) 03/22/2023   Palpitations 03/22/2023   Grieving 02/19/2022   Herpes zoster without complication 02/19/2022   Need for shingles vaccine 11/10/2021   Thyroid  nodule 08/09/2020   Heart murmur 08/09/2020   Phlebitis 11/03/2019   Elevated LDL cholesterol level 11/03/2019   Primary osteoarthritis of both knees 01/12/2018   Right knee pain 10/19/2017   Acute pain of left knee 10/19/2017   Other fatigue 10/18/2017   Shortness of breath on exertion 10/18/2017   Steatosis of liver 09/06/2017   Apnea 05/28/2017   Amenorrhea 05/28/2017   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   Class 3 severe obesity due to excess calories with body mass index (BMI) of 40.0 to 44.9 in adult 08/27/2016   Patellofemoral arthritis of left knee 09/05/2015   Right ankle pain  09/05/2015   Acquired supination of foot 09/05/2015   Essential hypertension 07/30/2015   Visual disturbance 07/30/2015   Right upper quadrant pain 07/30/2015   Vision disturbance 04/16/2015   Tobacco use disorder 04/16/2015   Screening for diabetes mellitus 11/30/2014   Encounter for screening mammogram for breast cancer 08/06/2014   Encounter for smoking cessation counseling 08/06/2014   Alcohol abuse 08/19/2011   Alcoholic hepatitis 08/18/2011   Dehydration 08/18/2011   ARF (acute renal failure) (HCC) 08/18/2011   Hypokalemia 08/18/2011   Hyponatremia 08/18/2011   Thrombocytopenia (HCC) 08/18/2011   Polysubstance dependence including opioid type drug, episodic abuse (HCC) 08/18/2011   Anxiety and depression 07/26/2006    PCP: Berneta Pouch MD  REFERRING PROVIDER: Vernetta Lonni GRADE, MD  REFERRING DIAG: [phone] (ICD-10-CM) - S/P total knee arthroplasty, right  THERAPY DIAG:  Chronic pain of right knee  Stiffness of right knee, not elsewhere classified  Muscle weakness (generalized)  Difficulty in walking, not elsewhere classified  Localized edema  Rationale for Evaluation and Treatment: Rehabilitation  ONSET DATE: Rt TKA 10/08/2023  SUBJECTIVE:   SUBJECTIVE STATEMENT: Pt came to clinic s/p recent Rt TKA performed on 10/08/2023.   Pt indicated walking with FWW primarily at home.  Reported trying to do things around the house over weekend since surgery.  Reported doing some household activity, self care showering.  Reported swelling and pain after activity with muscle spasms noted as well.   Reported trying to sleep in bed.   Pt indicated no home health.   PERTINENT HISTORY: Medical history: Anemia, aniexty, depression, arthritis, back pain, HTN, GERD, hyperlipidemia.  Has complaints of Lt knee.   PAIN:  NPRS scale: at worst 10/10, at current 0/10.  Pain location: Rt knee  Pain description: spasms, throbbing , spasms, tightness.  Aggravating factors:  WB, prolonged standing activity, end ranges, nighttime, swelling.  Relieving factors: pain medicine, OTC medicine.   PRECAUTIONS: None  WEIGHT BEARING RESTRICTIONS: No  FALLS:  Has patient fallen in last 6 months? 1 fall in house.  No injury.  Was pre surgery.    Reported having a fear of falling.   LIVING ENVIRONMENT: Lives in: House/apartment Stairs: 2-3 steps, no rail.  Has following equipment at home: FWW Has 2 dog   OCCUPATION: [employer] - standing/walking, etc.   PLOF: Independent, cooking, fishing, garden.  Walking dogs.   PATIENT GOALS: Reduce pain, get mobile, take a bath. Back to work.   OBJECTIVE:   PATIENT SURVEYS:  Patient-Specific Activity Scoring Scheme  0 represents "unable to perform." 10 represents "able to perform at prior level. 0 1 2 3 4 5 6 7 8 9  10 (Date  and Score)   Activity Eval  10/12/2023    1. Squatting  0    2. Getting on knees  0    3. Getting a bath 0   4. Stairs  0   5.    Score 0    Total score = sum of the activity scores/number of activities Minimum detectable change (90%CI) for average score = 2 points Minimum detectable change (90%CI) for single activity score = 3 points  COGNITION: 10/12/2023 Overall cognitive status: WFL    SENSATION: 10/12/2023 Not specifically tested.   EDEMA:  10/12/2023 Localized edema noted in Rt knee, lower leg.   MUSCLE LENGTH: 10/12/2023 No specific testing today  POSTURE:  10/12/2023 Weight shift to Lt in standing.   PALPATION: 10/12/2023 General tenderness around knee.   LOWER EXTREMITY ROM:   ROM Right Eval 10/12/2023 Left Eval 10/12/2023  Hip flexion    Hip extension    Hip abduction    Hip adduction    Hip internal rotation    Hip external rotation    Knee flexion 95 AROM in supine heel slide   Knee extension -6 AROM in seated LAQ  -3 PROM in supine heel prop   Ankle dorsiflexion    Ankle plantarflexion    Ankle inversion    Ankle eversion      (Blank rows = not tested)  LOWER EXTREMITY MMT:  MMT Right Eval 10/12/2023 Left Eval 10/12/2023  Hip flexion 5/5 5/5  Hip extension    Hip abduction    Hip adduction    Hip internal rotation    Hip external rotation    Knee flexion 4/5   Knee extension 4/5   Ankle dorsiflexion 5/5 5/5  Ankle plantarflexion    Ankle inversion    Ankle eversion     (Blank rows = not tested)  LOWER EXTREMITY SPECIAL TESTS:  10/12/2023 No specific testing.   FUNCTIONAL TESTS:  10/12/2023 18 inch chair transfer: able to perform on 1st attempt with Lt leg dominant.    GAIT: 10/12/2023 Modified independent ambulation c FWW, reduced stance on Rt leg with reduced step length Lt leg.  Maintained knee flexion in stance lacking full TKE                                                                                                                                                                        TODAY'S TREATMENT  DATE: 10/12/2023 Therex:    HEP instruction/performance c cues for techniques, handout provided.  Trial set performed of each for comprehension and symptom assessment.  See below for exercise list  Self Care Edema reduction education for elevation, ankle pumps.   Review of routine of movement in home to prevent tightness.   PATIENT EDUCATION:  10/12/2023 Education details: HEP, POC Person educated: Patient Education method: Explanation, Demonstration, Verbal cues, and Handouts Education comprehension: verbalized understanding, returned demonstration, and verbal cues required  HOME EXERCISE PROGRAM: Access Code: [email or web address] URL: [email or web address] Date: 10/12/2023 Prepared by: Ozell Silvan  Exercises - Supine Heel Slide  - 3-5 x daily - 7 x weekly - 1 sets - 10 reps - 5 hold - Supine Heel Slide with Strap (Mirrored)  - 3-5 x daily - 7 x weekly - 1 sets - 10 reps - 5 hold - Seated Long Arc  Quad  - 3-5 x daily - 7 x weekly - 1 sets - 5-10 reps - 2 hold - Supine Knee Extension Mobilization with Weight  - 4-5 x daily - 7 x weekly - 1 sets - 1 reps - to tolerance up to 5 mins hold - Seated Quad Set  - 3-5 x daily - 7 x weekly - 1 sets - 10 reps - 5 hold - Seated Knee Flexion AAROM  - 2-3 x daily - 7 x weekly - 1 sets - 5 reps - 10-15 hold  ASSESSMENT:  CLINICAL IMPRESSION: Patient is a 53 y.o. who comes to clinic with complaints of Rt knee pain s/p recent Rt TKA on 10/08/2023 with mobility, strength and movement coordination deficits that impair their ability to perform usual daily and recreational functional activities without increase difficulty/symptoms at this time.  Patient to benefit from skilled PT services to address impairments and limitations to improve to previous level of function without restriction secondary to condition.   OBJECTIVE IMPAIRMENTS: Abnormal gait, decreased activity tolerance, decreased balance, decreased coordination, decreased endurance, decreased mobility, difficulty walking, decreased ROM, decreased strength, hypomobility, increased edema, increased fascial restrictions, impaired perceived functional ability, increased muscle spasms, impaired flexibility, improper body mechanics, postural dysfunction, and pain.   ACTIVITY LIMITATIONS: carrying, lifting, bending, sitting, standing, squatting, sleeping, stairs, transfers, bed mobility, bathing, toileting, dressing, and locomotion level  PARTICIPATION LIMITATIONS: meal prep, cleaning, laundry, interpersonal relationship, driving, shopping, and community activity  PERSONAL FACTORS: Medical history: Anemia, aniexty, depression, arthritis, back pain, HTN, GERD, hyperlipidemia,  are also affecting patient's functional outcome.   REHAB POTENTIAL: Good  CLINICAL DECISION MAKING: Evolving/moderate complexity  EVALUATION COMPLEXITY: Moderate   GOALS: Goals reviewed with patient? Yes  SHORT TERM GOALS:  (target date for Short term goals are 3 weeks 11/02/2023)   1.  Patient will demonstrate independent use of home exercise program to maintain progress from in clinic treatments.  Goal status: New  LONG TERM GOALS: (target dates for all long term goals are 10 weeks  12/21/2023 )   1. Patient will demonstrate/report pain at worst less than or equal to 2/10 to facilitate minimal limitation in daily activity secondary to pain symptoms.  Goal status: New   2. Patient will demonstrate independent use of home exercise program to facilitate ability to maintain/progress functional gains from skilled physical therapy services.  Goal status: New   3. Patient will demonstrate Patient specific functional scale avg > or = 8/10 to indicate reduced disability due to condition.   Goal status: New   4.  Patient will demonstrate Rt LE MMT 5/5 throughout  to faciltiate usual transfers, stairs, squatting at [facility] for daily life.   Goal status: New   5.  Patient will demonstrate Rt knee 0-110 deg AROM for transfers, mobility at PLOF in daily life.  Goal status: New   6.  Patient will demonstrate ascending/descending stairs reciprocally s UE assist for community integration.   Goal status: New   7.  Patient will demonstrate independent ambulation > 500 ft s deviation for community integration.  Goal Status: New   PLAN:  PT FREQUENCY: 1-2x/week  PT DURATION: 10 weeks  PLANNED INTERVENTIONS: Can include 02853- PT Re-evaluation, 97110-Therapeutic exercises, 97530- Therapeutic activity, [id number]- Neuromuscular re-education, 97535- Self Care, 97140- Manual therapy, [phone]- Gait training, [phone]- Orthotic Fit/training, [phone]- Canalith repositioning, [id number]- Aquatic Therapy, [phone]- Electrical stimulation (unattended), [id number] Physical performance testing, 97016- Vasopneumatic device, [id number]- Ultrasound, [id number]- Traction (mechanical), [id number]- Ionotophoresis 4mg /ml Dexamethasone ,  [id number] - Needle insertion w/o injection 1  or 2 muscles, 20561 - Needle insertion w/o injection 3 or more muscles.   Patient/Family education, Balance training, Stair training, Taping, Dry Needling, Joint mobilization, Joint manipulation, Spinal manipulation, Spinal mobilization, Scar mobilization, Vestibular training, Visual/preceptual remediation/compensation, DME instructions, Cryotherapy, and Moist heat.  All performed as medically necessary.  All included unless contraindicated  PLAN FOR NEXT SESSION: Review HEP knowledge/results. Strengthening, balance early.     Ozell Silvan, PT, DPT, OCS, ATC 10/12/23  4:46 PM

## 2023-10-13 ENCOUNTER — Other Ambulatory Visit: Payer: Self-pay

## 2023-10-13 ENCOUNTER — Other Ambulatory Visit (HOSPITAL_COMMUNITY): Payer: Self-pay

## 2023-10-13 ENCOUNTER — Other Ambulatory Visit: Payer: Self-pay | Admitting: Orthopaedic Surgery

## 2023-10-13 MED ORDER — OXYCODONE HCL 5 MG PO TABS
5.0000 mg | ORAL_TABLET | ORAL | 0 refills | Status: DC | PRN
  Filled 2023-10-13: qty 30, 3d supply, fill #0

## 2023-10-14 ENCOUNTER — Ambulatory Visit (INDEPENDENT_AMBULATORY_CARE_PROVIDER_SITE_OTHER)

## 2023-10-14 DIAGNOSIS — R600 Localized edema: Secondary | ICD-10-CM

## 2023-10-14 DIAGNOSIS — M25561 Pain in right knee: Secondary | ICD-10-CM

## 2023-10-14 DIAGNOSIS — G8929 Other chronic pain: Secondary | ICD-10-CM | POA: Diagnosis not present

## 2023-10-14 DIAGNOSIS — M6281 Muscle weakness (generalized): Secondary | ICD-10-CM | POA: Diagnosis not present

## 2023-10-14 DIAGNOSIS — M25661 Stiffness of right knee, not elsewhere classified: Secondary | ICD-10-CM | POA: Diagnosis not present

## 2023-10-14 DIAGNOSIS — R262 Difficulty in walking, not elsewhere classified: Secondary | ICD-10-CM | POA: Diagnosis not present

## 2023-10-14 NOTE — Therapy (Signed)
 OUTPATIENT PHYSICAL THERAPY TREATMENT   Patient Name: Angela Stark MRN: [id number] DOB:10-Jun-1970, 53 y.o., female Today's Date: 10/14/2023  END OF SESSION:  PT End of Session - 10/14/23 1703     Visit Number 2    Number of Visits 20    Date for PT Re-Evaluation 12/21/23    Authorization Type [facility] [employer] $25 copay    PT Start Time 1602    PT Stop Time 1650    PT Time Calculation (min) 48 min    Activity Tolerance Patient tolerated treatment well    Behavior During Therapy [facility] for tasks assessed/performed           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   Amenorrhea 05/28/2017   Anemia    Anxiety and depression 07/26/2006   Qualifier: Diagnosis of   By: Lorane Browning         Apnea 05/28/2017   ARF (acute renal failure) (HCC) 08/18/2011   Arthritis    Back pain    Bell's palsy 05/10/2023   Class 3 severe obesity due to excess calories with body mass index (BMI) of 40.0 to 44.9 in adult 08/27/2016   Dehydration 08/18/2011   Essential hypertension 07/30/2015   Fatty liver    GERD (gastroesophageal reflux disease)    Heart murmur    When pregnant   Hyperlipidemia    Hypertension    Hypokalemia 08/18/2011   Hyponatremia 08/18/2011   Joint pain    Knee pain    Muscle stiffness    Need for shingles vaccine 11/10/2021   Obstructive sleep apnea (adult) (pediatric) 05/10/2023   Other fatigue 10/18/2017   Palpitations 03/22/2023   Patellofemoral arthritis of left knee 09/05/2015   Phlebitis    Polysubstance dependence including opioid type drug, episodic abuse (HCC) 08/18/2011   Primary osteoarthritis of both knees 01/12/2018   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   Right ankle pain 09/05/2015   Right knee pain 10/19/2017   Right upper quadrant pain 07/30/2015   Screening for diabetes mellitus 11/30/2014   Shortness  of breath on exertion    Sleep apnea    Steatosis of liver 09/06/2017   Stress    Swelling of both lower extremities    Thrombocytopenia (HCC) 08/18/2011   Thyroid  nodule 08/09/2020   Tobacco use disorder 04/16/2015   Chronic      Trouble in sleeping    Vision disturbance 04/16/2015   1/17 ?etiology     Past Surgical History:  Procedure Laterality Date   COLONOSCOPY     OVARY SURGERY     TOTAL KNEE ARTHROPLASTY Right 10/08/2023   Procedure: ARTHROPLASTY, KNEE, TOTAL;  Surgeon: Vernetta Lonni GRADE, MD;  Location: [facility];  Service: Orthopedics;  Laterality: Right;   WISDOM TOOTH EXTRACTION     Patient Active Problem List   Diagnosis Date Noted   Status post total right knee replacement 10/08/2023   Unilateral primary osteoarthritis, right knee 10/07/2023   Bell's palsy 05/10/2023   Obstructive sleep apnea (adult) (pediatric) 05/10/2023   Anemia    Arthritis    Back pain    GERD (gastroesophageal reflux disease)    Hyperlipidemia    Joint pain    Muscle stiffness    Stress    Swelling of both lower extremities    Trouble in sleeping    Morbid obesity with BMI of  40.0-44.9, adult (HCC) 03/22/2023   Palpitations 03/22/2023   Grieving 02/19/2022   Herpes zoster without complication 02/19/2022   Need for shingles vaccine 11/10/2021   Thyroid  nodule 08/09/2020   Heart murmur 08/09/2020   Phlebitis 11/03/2019   Elevated LDL cholesterol level 11/03/2019   Primary osteoarthritis of both knees 01/12/2018   Right knee pain 10/19/2017   Acute pain of left knee 10/19/2017   Other fatigue 10/18/2017   Shortness of breath on exertion 10/18/2017   Steatosis of liver 09/06/2017   Apnea 05/28/2017   Amenorrhea 05/28/2017   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   Class 3 severe obesity due to excess calories with body mass index (BMI) of 40.0 to 44.9 in adult 08/27/2016   Patellofemoral arthritis of left knee 09/05/2015   Right ankle pain 09/05/2015   Acquired supination of  foot 09/05/2015   Essential hypertension 07/30/2015   Visual disturbance 07/30/2015   Right upper quadrant pain 07/30/2015   Vision disturbance 04/16/2015   Tobacco use disorder 04/16/2015   Screening for diabetes mellitus 11/30/2014   Encounter for screening mammogram for breast cancer 08/06/2014   Encounter for smoking cessation counseling 08/06/2014   Alcohol abuse 08/19/2011   Alcoholic hepatitis 08/18/2011   Dehydration 08/18/2011   ARF (acute renal failure) (HCC) 08/18/2011   Hypokalemia 08/18/2011   Hyponatremia 08/18/2011   Thrombocytopenia (HCC) 08/18/2011   Polysubstance dependence including opioid type drug, episodic abuse (HCC) 08/18/2011   Anxiety and depression 07/26/2006    PCP: Berneta Pouch MD  REFERRING PROVIDER: Vernetta Lonni GRADE, MD  REFERRING DIAG: [id number] (ICD-10-CM) - S/P total knee arthroplasty, right  THERAPY DIAG:  Chronic pain of right knee  Difficulty in walking, not elsewhere classified  Localized edema  Stiffness of right knee, not elsewhere classified  Muscle weakness (generalized)  Rationale for Evaluation and Treatment: Rehabilitation  ONSET DATE: Rt TKA 10/08/2023  SUBJECTIVE:   SUBJECTIVE STATEMENT: Pt reports any ADLs involving bending are problematic. Able to shower independently.    PERTINENT HISTORY: Medical history: Anemia, aniexty, depression, arthritis, back pain, HTN, GERD, hyperlipidemia.  Has complaints of Lt knee.   PAIN:  NPRS scale: at worst 10/10, at current 2/10.  Pain location: Rt knee  Pain description: spasms, throbbing , spasms, tightness.  Aggravating factors: WB, prolonged standing activity, end ranges, nighttime, swelling.  Relieving factors: pain medicine, OTC medicine.   PRECAUTIONS: None  WEIGHT BEARING RESTRICTIONS: No  FALLS:  Has patient fallen in last 6 months? 1 fall in house.  No injury.  Was pre surgery.    Reported having a fear of falling.   LIVING ENVIRONMENT: Lives in:  House/apartment Stairs: 2-3 steps, no rail.  Has following equipment at home: FWW Has 2 dog   OCCUPATION: [employer] - standing/walking, etc.   PLOF: Independent, cooking, fishing, garden.  Walking dogs.   PATIENT GOALS: Reduce pain, get mobile, take a bath. Back to work.   OBJECTIVE:   PATIENT SURVEYS:  Patient-Specific Activity Scoring Scheme  0 represents "unable to perform." 10 represents "able to perform at prior level. 0 1 2 3 4 5 6 7 8 9  10 (Date and Score)   Activity Eval  10/12/2023    1. Squatting  0    2. Getting on knees  0    3. Getting a bath 0   4. Stairs  0   5.    Score 0    Total score = sum of the activity scores/number of activities Minimum detectable change (90%CI)  for average score = 2 points Minimum detectable change (90%CI) for single activity score = 3 points  COGNITION: 10/12/2023 Overall cognitive status: WFL    SENSATION: 10/12/2023 Not specifically tested.   EDEMA:  10/12/2023 Localized edema noted in Rt knee, lower leg.   MUSCLE LENGTH: 10/12/2023 No specific testing today  POSTURE:  10/12/2023 Weight shift to Lt in standing.   PALPATION: 10/12/2023 General tenderness around knee.   LOWER EXTREMITY ROM:   ROM Right Eval 10/12/2023 Left Eval 10/12/2023  Hip flexion    Hip extension    Hip abduction    Hip adduction    Hip internal rotation    Hip external rotation    Knee flexion 95 AROM in supine heel slide   Knee extension -6 AROM in seated LAQ  -3 PROM in supine heel prop   Ankle dorsiflexion    Ankle plantarflexion    Ankle inversion    Ankle eversion     (Blank rows = not tested)  LOWER EXTREMITY MMT:  MMT Right Eval 10/12/2023 Left Eval 10/12/2023  Hip flexion 5/5 5/5  Hip extension    Hip abduction    Hip adduction    Hip internal rotation    Hip external rotation    Knee flexion 4/5   Knee extension 4/5   Ankle dorsiflexion 5/5 5/5  Ankle plantarflexion    Ankle inversion     Ankle eversion     (Blank rows = not tested)  LOWER EXTREMITY SPECIAL TESTS:  10/12/2023 No specific testing.   FUNCTIONAL TESTS:  10/12/2023 18 inch chair transfer: able to perform on 1st attempt with Lt leg dominant.    GAIT: 10/12/2023 Modified independent ambulation c FWW, reduced stance on Rt leg with reduced step length Lt leg.  Maintained knee flexion in stance lacking full TKE                                                                                                                                                                        TODAY'S TREATMENT                                                                           DATE: 10/14/2023 Therex:  Nustep 8 min level 3   Supine heel slides 2x10 with 3 sec hold at the end range Supine quad sets 20x with 5 sec hold Seated marching 20x2 LAQ 20x HEP review  Neuro Re ed: Step ups  on 4 inch step  2x10 Lean in for flexion- 6 inch step- 10 sec x10  Standing on airex with perturbations for balance   Vaso 38 degrees 10 min     PATIENT EDUCATION:  10/12/2023 Education details: HEP, POC Person educated: Patient Education method: Programmer, multimedia, Demonstration, Verbal cues, and Handouts Education comprehension: verbalized understanding, returned demonstration, and verbal cues required  HOME EXERCISE PROGRAM: Access Code: [email or web address] URL: [email or web address] Date: 10/12/2023 Prepared by: Ozell Silvan  Exercises - Supine Heel Slide  - 3-5 x daily - 7 x weekly - 1 sets - 10 reps - 5 hold - Supine Heel Slide with Strap (Mirrored)  - 3-5 x daily - 7 x weekly - 1 sets - 10 reps - 5 hold - Seated Long Arc Quad  - 3-5 x daily - 7 x weekly - 1 sets - 5-10 reps - 2 hold - Supine Knee Extension Mobilization with Weight  - 4-5 x daily - 7 x weekly - 1 sets - 1 reps - to tolerance up to 5 mins hold - Seated Quad Set  - 3-5 x daily - 7 x weekly - 1 sets - 10 reps - 5 hold - Seated Knee Flexion AAROM  - 2-3 x  daily - 7 x weekly - 1 sets - 5 reps - 10-15 hold  ASSESSMENT:  CLINICAL IMPRESSION: Pt needed repeated VC for activating hip flexion with lifting R leg onto step. Able to ambulate 25 feet+ in the clinic without use of AD.    Eval-Patient is a 53 y.o. who comes to clinic with complaints of Rt knee pain s/p recent Rt TKA on 10/08/2023 with mobility, strength and movement coordination deficits that impair their ability to perform usual daily and recreational functional activities without increase difficulty/symptoms at this time.  Patient to benefit from skilled PT services to address impairments and limitations to improve to previous level of function without restriction secondary to condition.   OBJECTIVE IMPAIRMENTS: Abnormal gait, decreased activity tolerance, decreased balance, decreased coordination, decreased endurance, decreased mobility, difficulty walking, decreased ROM, decreased strength, hypomobility, increased edema, increased fascial restrictions, impaired perceived functional ability, increased muscle spasms, impaired flexibility, improper body mechanics, postural dysfunction, and pain.   ACTIVITY LIMITATIONS: carrying, lifting, bending, sitting, standing, squatting, sleeping, stairs, transfers, bed mobility, bathing, toileting, dressing, and locomotion level  PARTICIPATION LIMITATIONS: meal prep, cleaning, laundry, interpersonal relationship, driving, shopping, and community activity  PERSONAL FACTORS: Medical history: Anemia, aniexty, depression, arthritis, back pain, HTN, GERD, hyperlipidemia,  are also affecting patient's functional outcome.   REHAB POTENTIAL: Good  CLINICAL DECISION MAKING: Evolving/moderate complexity  EVALUATION COMPLEXITY: Moderate   GOALS: Goals reviewed with patient? Yes  SHORT TERM GOALS: (target date for Short term goals are 3 weeks 11/02/2023)   1.  Patient will demonstrate independent use of home exercise program to maintain progress from in  clinic treatments.  Goal status: New  LONG TERM GOALS: (target dates for all long term goals are 10 weeks  12/21/2023 )   1. Patient will demonstrate/report pain at worst less than or equal to 2/10 to facilitate minimal limitation in daily activity secondary to pain symptoms.  Goal status: New   2. Patient will demonstrate independent use of home exercise program to facilitate ability to maintain/progress functional gains from skilled physical therapy services.  Goal status: New   3. Patient will demonstrate Patient specific functional scale avg > or = 8/10 to indicate reduced disability due to condition.   Goal status: New   4.  Patient will demonstrate Rt LE MMT 5/5 throughout to faciltiate usual transfers, stairs, squatting at [facility] for daily life.   Goal status: New   5.  Patient will demonstrate Rt knee 0-110 deg AROM for transfers, mobility at PLOF in daily life.  Goal status: New   6.  Patient will demonstrate ascending/descending stairs reciprocally s UE assist for community integration.   Goal status: New   7.  Patient will demonstrate independent ambulation > 500 ft s deviation for community integration.  Goal Status: New   PLAN:  PT FREQUENCY: 1-2x/week  PT DURATION: 10 weeks  PLANNED INTERVENTIONS: Can include 02853- PT Re-evaluation, 97110-Therapeutic exercises, 97530- Therapeutic activity, [id number]- Neuromuscular re-education, 97535- Self Care, 97140- Manual therapy, [id number]- Gait training, [id number]- Orthotic Fit/training, [phone]- Canalith repositioning, [id number]- Aquatic Therapy, [phone]- Electrical stimulation (unattended), [id number] Physical performance testing, 97016- Vasopneumatic device, [id number]- Ultrasound, [id number]- Traction (mechanical), [id number]- Ionotophoresis 4mg /ml Dexamethasone ,  79439 - Needle insertion w/o injection 1 or 2 muscles, 20561 - Needle insertion w/o injection 3 or more muscles.   Patient/Family education, Balance training, Stair training, Taping, Dry Needling,  Joint mobilization, Joint manipulation, Spinal manipulation, Spinal mobilization, Scar mobilization, Vestibular training, Visual/preceptual remediation/compensation, DME instructions, Cryotherapy, and Moist heat.  All performed as medically necessary.  All included unless contraindicated  PLAN FOR NEXT SESSION: Continue to focus on ROM exercises.    Burnard Meth, PT 10/14/23  5:06 PM

## 2023-10-19 ENCOUNTER — Encounter: Payer: Self-pay | Admitting: Rehabilitative and Restorative Service Providers"

## 2023-10-19 ENCOUNTER — Ambulatory Visit: Admitting: Rehabilitative and Restorative Service Providers"

## 2023-10-19 DIAGNOSIS — G8929 Other chronic pain: Secondary | ICD-10-CM

## 2023-10-19 DIAGNOSIS — M6281 Muscle weakness (generalized): Secondary | ICD-10-CM

## 2023-10-19 DIAGNOSIS — R262 Difficulty in walking, not elsewhere classified: Secondary | ICD-10-CM

## 2023-10-19 DIAGNOSIS — M25661 Stiffness of right knee, not elsewhere classified: Secondary | ICD-10-CM | POA: Diagnosis not present

## 2023-10-19 DIAGNOSIS — R600 Localized edema: Secondary | ICD-10-CM | POA: Diagnosis not present

## 2023-10-19 DIAGNOSIS — M25561 Pain in right knee: Secondary | ICD-10-CM | POA: Diagnosis not present

## 2023-10-19 NOTE — Therapy (Signed)
 OUTPATIENT PHYSICAL THERAPY TREATMENT   Patient Name: Angela Stark MRN: [id number] DOB:03-06-1971, 53 y.o., female Today's Date: 10/19/2023  END OF SESSION:  PT End of Session - 10/19/23 1352     Visit Number 3    Number of Visits 20    Date for PT Re-Evaluation 12/21/23    Authorization Type [facility] [employer] $25 copay    Progress Note Due on Visit 10    PT Start Time 1346    PT Stop Time 1431    PT Time Calculation (min) 45 min    Activity Tolerance Patient tolerated treatment well    Behavior During Therapy [facility] for tasks assessed/performed            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   Amenorrhea 05/28/2017   Anemia    Anxiety and depression 07/26/2006   Qualifier: Diagnosis of   By: Lorane Browning         Apnea 05/28/2017   ARF (acute renal failure) (HCC) 08/18/2011   Arthritis    Back pain    Bell's palsy 05/10/2023   Class 3 severe obesity due to excess calories with body mass index (BMI) of 40.0 to 44.9 in adult 08/27/2016   Dehydration 08/18/2011   Essential hypertension 07/30/2015   Fatty liver    GERD (gastroesophageal reflux disease)    Heart murmur    When pregnant   Hyperlipidemia    Hypertension    Hypokalemia 08/18/2011   Hyponatremia 08/18/2011   Joint pain    Knee pain    Muscle stiffness    Need for shingles vaccine 11/10/2021   Obstructive sleep apnea (adult) (pediatric) 05/10/2023   Other fatigue 10/18/2017   Palpitations 03/22/2023   Patellofemoral arthritis of left knee 09/05/2015   Phlebitis    Polysubstance dependence including opioid type drug, episodic abuse (HCC) 08/18/2011   Primary osteoarthritis of both knees 01/12/2018   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   Right ankle pain 09/05/2015   Right knee pain 10/19/2017   Right upper quadrant pain 07/30/2015   Screening for  diabetes mellitus 11/30/2014   Shortness of breath on exertion    Sleep apnea    Steatosis of liver 09/06/2017   Stress    Swelling of both lower extremities    Thrombocytopenia (HCC) 08/18/2011   Thyroid  nodule 08/09/2020   Tobacco use disorder 04/16/2015   Chronic      Trouble in sleeping    Vision disturbance 04/16/2015   1/17 ?etiology     Past Surgical History:  Procedure Laterality Date   COLONOSCOPY     OVARY SURGERY     TOTAL KNEE ARTHROPLASTY Right 10/08/2023   Procedure: ARTHROPLASTY, KNEE, TOTAL;  Surgeon: Vernetta Lonni GRADE, MD;  Location: [facility];  Service: Orthopedics;  Laterality: Right;   WISDOM TOOTH EXTRACTION     Patient Active Problem List   Diagnosis Date Noted   Status post total right knee replacement 10/08/2023   Unilateral primary osteoarthritis, right knee 10/07/2023   Bell's palsy 05/10/2023   Obstructive sleep apnea (adult) (pediatric) 05/10/2023   Anemia    Arthritis    Back pain    GERD (gastroesophageal reflux disease)    Hyperlipidemia    Joint pain    Muscle stiffness    Stress    Swelling of both lower extremities    Trouble  in sleeping    Morbid obesity with BMI of 40.0-44.9, adult (HCC) 03/22/2023   Palpitations 03/22/2023   Grieving 02/19/2022   Herpes zoster without complication 02/19/2022   Need for shingles vaccine 11/10/2021   Thyroid  nodule 08/09/2020   Heart murmur 08/09/2020   Phlebitis 11/03/2019   Elevated LDL cholesterol level 11/03/2019   Primary osteoarthritis of both knees 01/12/2018   Right knee pain 10/19/2017   Acute pain of left knee 10/19/2017   Other fatigue 10/18/2017   Shortness of breath on exertion 10/18/2017   Steatosis of liver 09/06/2017   Apnea 05/28/2017   Amenorrhea 05/28/2017   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   Class 3 severe obesity due to excess calories with body mass index (BMI) of 40.0 to 44.9 in adult 08/27/2016   Patellofemoral arthritis of left knee 09/05/2015   Right ankle  pain 09/05/2015   Acquired supination of foot 09/05/2015   Essential hypertension 07/30/2015   Visual disturbance 07/30/2015   Right upper quadrant pain 07/30/2015   Vision disturbance 04/16/2015   Tobacco use disorder 04/16/2015   Screening for diabetes mellitus 11/30/2014   Encounter for screening mammogram for breast cancer 08/06/2014   Encounter for smoking cessation counseling 08/06/2014   Alcohol abuse 08/19/2011   Alcoholic hepatitis 08/18/2011   Dehydration 08/18/2011   ARF (acute renal failure) (HCC) 08/18/2011   Hypokalemia 08/18/2011   Hyponatremia 08/18/2011   Thrombocytopenia (HCC) 08/18/2011   Polysubstance dependence including opioid type drug, episodic abuse (HCC) 08/18/2011   Anxiety and depression 07/26/2006    PCP: Berneta Pouch MD  REFERRING PROVIDER: Vernetta Lonni GRADE, MD  REFERRING DIAG: [id number] (ICD-10-CM) - S/P total knee arthroplasty, right  THERAPY DIAG:  Chronic pain of right knee  Difficulty in walking, not elsewhere classified  Localized edema  Stiffness of right knee, not elsewhere classified  Muscle weakness (generalized)  Rationale for Evaluation and Treatment: Rehabilitation  ONSET DATE: Rt TKA 10/08/2023  SUBJECTIVE:   SUBJECTIVE STATEMENT: Pt indicated chief complaint of tightness, reported less swelling.  Reported quick shooting pain at times at night.   PERTINENT HISTORY: Medical history: Anemia, aniexty, depression, arthritis, back pain, HTN, GERD, hyperlipidemia.  Has complaints of Lt knee.   PAIN:  NPRS scale: up to 7/10 Pain location: Rt knee  Pain description: spasms, throbbing , spasms, tightness.  Aggravating factors: WB, prolonged standing activity, end ranges, nighttime, swelling.  Relieving factors: pain medicine, OTC medicine.   PRECAUTIONS: None  WEIGHT BEARING RESTRICTIONS: No  FALLS:  Has patient fallen in last 6 months? 1 fall in house.  No injury.  Was pre surgery.    Reported having a fear  of falling.   LIVING ENVIRONMENT: Lives in: House/apartment Stairs: 2-3 steps, no rail.  Has following equipment at home: FWW Has 2 dog   OCCUPATION: [employer] - standing/walking, etc.   PLOF: Independent, cooking, fishing, garden.  Walking dogs.   PATIENT GOALS: Reduce pain, get mobile, take a bath. Back to work.   OBJECTIVE:   PATIENT SURVEYS:  Patient-Specific Activity Scoring Scheme  0 represents "unable to perform." 10 represents "able to perform at prior level. 0 1 2 3 4 5 6 7 8 9  10 (Date and Score)   Activity Eval  10/12/2023    1. Squatting  0    2. Getting on knees  0    3. Getting a bath 0   4. Stairs  0   5.    Score 0    Total score =  sum of the activity scores/number of activities Minimum detectable change (90%CI) for average score = 2 points Minimum detectable change (90%CI) for single activity score = 3 points  COGNITION: 10/12/2023 Overall cognitive status: WFL    SENSATION: 10/12/2023 Not specifically tested.   EDEMA:  10/12/2023 Localized edema noted in Rt knee, lower leg.   MUSCLE LENGTH: 10/12/2023 No specific testing today  POSTURE:  10/12/2023 Weight shift to Lt in standing.   PALPATION: 10/12/2023 General tenderness around knee.   LOWER EXTREMITY ROM:   ROM Right Eval 10/12/2023 Left Eval 10/12/2023 Right 10/19/2023   Hip flexion     Hip extension     Hip abduction     Hip adduction     Hip internal rotation     Hip external rotation     Knee flexion 95 AROM in supine heel slide  100 AROM heel slide  Knee extension -6 AROM in seated LAQ  -3 PROM in supine heel prop    Ankle dorsiflexion     Ankle plantarflexion     Ankle inversion     Ankle eversion      (Blank rows = not tested)  LOWER EXTREMITY MMT:  MMT Right Eval 10/12/2023 Left Eval 10/12/2023  Hip flexion 5/5 5/5  Hip extension    Hip abduction    Hip adduction    Hip internal rotation    Hip external rotation    Knee flexion 4/5    Knee extension 4/5   Ankle dorsiflexion 5/5 5/5  Ankle plantarflexion    Ankle inversion    Ankle eversion     (Blank rows = not tested)  LOWER EXTREMITY SPECIAL TESTS:  10/12/2023 No specific testing.   FUNCTIONAL TESTS:  10/12/2023 18 inch chair transfer: able to perform on 1st attempt with Lt leg dominant.    GAIT: 10/12/2023 Modified independent ambulation c FWW, reduced stance on Rt leg with reduced step length Lt leg.  Maintained knee flexion in stance lacking full TKE                                                                                                                                                                        TODAY'S TREATMENT                                                                          DATE: 10/19/2023 Therex: UBE UE/LE for knee ROM lvl 2.5 8 mins Seated Rt leg LAQ  with end range pauses both directions with contralateral leg movement opposite 3 lbs 2 x 15 Supine Rt knee heel slide AROM 5 sec hold, quad set 5 sec hold combo x 10  Supine Rt leg quad set with SLR x 15 Supine heel prop Rt with start of vaso for extension gains to tolerance x5    Gait Training(to improve quality and mechanisms of gait) Step to pattern over 6 inch hurdle in // bars with occasional to moderate HHA with focus on knee flexion clearing step.  10 ft x x 5 leading with each LE first Step over/back 4 inch step with anterior/posterior weight shift to improve swing phase and weight shift control x 10 bilateral in // bars with occasional HHA  Manual Rt knee flexion c distraction IR mobilization with movement for flexion gains with contralateral leg movement opposite.   Vaso 10 mins medium compression Rt knee in elevation 34 deg with TKE stretch at start    TODAY'S TREATMENT                                                                          DATE: 10/14/2023 Therex:  Nustep 8 min level 3   Supine heel slides 2x10 with 3 sec hold at the end range Supine quad  sets 20x with 5 sec hold Seated marching 20x2 LAQ 20x HEP review  Neuro Re ed: Step ups on 4 inch step  2x10 Lean in for flexion- 6 inch step- 10 sec x10  Standing on airex with perturbations for balance   Vaso 38 degrees 10 min     PATIENT EDUCATION:  10/12/2023 Education details: HEP, POC Person educated: Patient Education method: Programmer, multimedia, Demonstration, Verbal cues, and Handouts Education comprehension: verbalized understanding, returned demonstration, and verbal cues required  HOME EXERCISE PROGRAM: Access Code: [email or web address] URL: [email or web address] Date: 10/12/2023 Prepared by: Ozell Silvan  Exercises - Supine Heel Slide  - 3-5 x daily - 7 x weekly - 1 sets - 10 reps - 5 hold - Supine Heel Slide with Strap (Mirrored)  - 3-5 x daily - 7 x weekly - 1 sets - 10 reps - 5 hold - Seated Long Arc Quad  - 3-5 x daily - 7 x weekly - 1 sets - 5-10 reps - 2 hold - Supine Knee Extension Mobilization with Weight  - 4-5 x daily - 7 x weekly - 1 sets - 1 reps - to tolerance up to 5 mins hold - Seated Quad Set  - 3-5 x daily - 7 x weekly - 1 sets - 10 reps - 5 hold - Seated Knee Flexion AAROM  - 2-3 x daily - 7 x weekly - 1 sets - 5 reps - 10-15 hold  ASSESSMENT:  CLINICAL IMPRESSION: Improved quality in mobility noted today compared to evaluation.  Despite gains in mobility, flexion in swing was limited in technique but improved with use of gait training in clinic. Continued skilled PT services indicated at this time.   OBJECTIVE IMPAIRMENTS: Abnormal gait, decreased activity tolerance, decreased balance, decreased coordination, decreased endurance, decreased mobility, difficulty walking, decreased ROM, decreased strength, hypomobility, increased edema, increased fascial restrictions, impaired perceived functional ability, increased muscle spasms, impaired flexibility, improper body mechanics, postural dysfunction, and  pain.   ACTIVITY LIMITATIONS: carrying,  lifting, bending, sitting, standing, squatting, sleeping, stairs, transfers, bed mobility, bathing, toileting, dressing, and locomotion level  PARTICIPATION LIMITATIONS: meal prep, cleaning, laundry, interpersonal relationship, driving, shopping, and community activity  PERSONAL FACTORS: Medical history: Anemia, aniexty, depression, arthritis, back pain, HTN, GERD, hyperlipidemia,  are also affecting patient's functional outcome.   REHAB POTENTIAL: Good  CLINICAL DECISION MAKING: Evolving/moderate complexity  EVALUATION COMPLEXITY: Moderate   GOALS: Goals reviewed with patient? Yes  SHORT TERM GOALS: (target date for Short term goals are 3 weeks 11/02/2023)   1.  Patient will demonstrate independent use of home exercise program to maintain progress from in clinic treatments.  Goal status: New  LONG TERM GOALS: (target dates for all long term goals are 10 weeks  12/21/2023 )   1. Patient will demonstrate/report pain at worst less than or equal to 2/10 to facilitate minimal limitation in daily activity secondary to pain symptoms.  Goal status: New   2. Patient will demonstrate independent use of home exercise program to facilitate ability to maintain/progress functional gains from skilled physical therapy services.  Goal status: New   3. Patient will demonstrate Patient specific functional scale avg > or = 8/10 to indicate reduced disability due to condition.   Goal status: New   4.  Patient will demonstrate Rt LE MMT 5/5 throughout to faciltiate usual transfers, stairs, squatting at [facility] for daily life.   Goal status: New   5.  Patient will demonstrate Rt knee 0-110 deg AROM for transfers, mobility at PLOF in daily life.  Goal status: New   6.  Patient will demonstrate ascending/descending stairs reciprocally s UE assist for community integration.   Goal status: New   7.  Patient will demonstrate independent ambulation > 500 ft s deviation for community integration.  Goal  Status: New   PLAN:  PT FREQUENCY: 1-2x/week  PT DURATION: 10 weeks  PLANNED INTERVENTIONS: Can include 02853- PT Re-evaluation, 97110-Therapeutic exercises, 97530- Therapeutic activity, [id number]- Neuromuscular re-education, 97535- Self Care, 97140- Manual therapy, [phone]- Gait training, [phone]- Orthotic Fit/training, [phone]- Canalith repositioning, [id number]- Aquatic Therapy, [phone]- Electrical stimulation (unattended), [id number] Physical performance testing, 97016- Vasopneumatic device, [id number]- Ultrasound, [id number]- Traction (mechanical), [id number]- Ionotophoresis 4mg /ml Dexamethasone ,  [id number] - Needle insertion w/o injection 1 or 2 muscles, 20561 - Needle insertion w/o injection 3 or more muscles.   Patient/Family education, Balance training, Stair training, Taping, Dry Needling, Joint mobilization, Joint manipulation, Spinal manipulation, Spinal mobilization, Scar mobilization, Vestibular training, Visual/preceptual remediation/compensation, DME instructions, Cryotherapy, and Moist heat.  All performed as medically necessary.  All included unless contraindicated  PLAN FOR NEXT SESSION: Leg press inclusion.  Driving trials with bosu ball reaction.    Ozell Silvan, PT, DPT, OCS, ATC 10/19/23  2:29 PM

## 2023-10-21 ENCOUNTER — Ambulatory Visit (INDEPENDENT_AMBULATORY_CARE_PROVIDER_SITE_OTHER): Admitting: Orthopaedic Surgery

## 2023-10-21 ENCOUNTER — Other Ambulatory Visit (HOSPITAL_COMMUNITY): Payer: Self-pay

## 2023-10-21 ENCOUNTER — Other Ambulatory Visit: Payer: Self-pay

## 2023-10-21 ENCOUNTER — Encounter: Payer: Self-pay | Admitting: Orthopaedic Surgery

## 2023-10-21 DIAGNOSIS — Z96651 Presence of right artificial knee joint: Secondary | ICD-10-CM

## 2023-10-21 MED ORDER — GABAPENTIN 300 MG PO CAPS
300.0000 mg | ORAL_CAPSULE | Freq: Every evening | ORAL | 0 refills | Status: DC | PRN
  Filled 2023-10-21 (×2): qty 30, 30d supply, fill #0

## 2023-10-21 MED ORDER — OXYCODONE HCL 5 MG PO TABS
5.0000 mg | ORAL_TABLET | Freq: Four times a day (QID) | ORAL | 0 refills | Status: DC | PRN
  Filled 2023-10-21 (×2): qty 30, 4d supply, fill #0

## 2023-10-21 MED ORDER — TIZANIDINE HCL 4 MG PO TABS
4.0000 mg | ORAL_TABLET | Freq: Four times a day (QID) | ORAL | 0 refills | Status: DC | PRN
  Filled 2023-10-21 (×2): qty 30, 8d supply, fill #0

## 2023-10-21 NOTE — Progress Notes (Signed)
 The patient is a 53 year old female who is here today for her first postoperative visit status post a right total knee arthroplasty to treat severe right knee arthritis.  She actually comes in walking today without any assistive device and looks like she is doing well.  She is already transition outpatient therapy as well.  According to the therapy notes they have been able to flex her past 90 degrees and they are working on improving her knee extension.  She lacks terminal extension by few degrees with that right knee.  On exam today her incision looks good staples have been removed and Steri-Strips applied.  Her calf is soft.  She actually has pretty good range of motion today in terms of almost full extension and we can flex her just past 90 degrees.  She also has excellent strength with dorsiflexion and plantarflexion of her foot and moving her leg back-and-forth and she is walking with a minimal limp.  She is only taking narcotics at night.  Surprisingly I do feel comfortable letting her drive and she will also ask physical therapy about this but I am comfortable with her driving based on my exam today.  I will send in some more oxycodone  as well as a muscle relaxant and trying some Neurontin  at night.  Will see her back in 4 weeks to see how she is doing overall from a range of motion standpoint but no x-rays are needed.

## 2023-10-22 ENCOUNTER — Encounter: Payer: Self-pay | Admitting: Rehabilitative and Restorative Service Providers"

## 2023-10-22 ENCOUNTER — Ambulatory Visit: Payer: Self-pay | Admitting: Rehabilitative and Restorative Service Providers"

## 2023-10-22 DIAGNOSIS — G8929 Other chronic pain: Secondary | ICD-10-CM | POA: Diagnosis not present

## 2023-10-22 DIAGNOSIS — R600 Localized edema: Secondary | ICD-10-CM

## 2023-10-22 DIAGNOSIS — M6281 Muscle weakness (generalized): Secondary | ICD-10-CM | POA: Diagnosis not present

## 2023-10-22 DIAGNOSIS — M25661 Stiffness of right knee, not elsewhere classified: Secondary | ICD-10-CM

## 2023-10-22 DIAGNOSIS — M25561 Pain in right knee: Secondary | ICD-10-CM

## 2023-10-22 DIAGNOSIS — R262 Difficulty in walking, not elsewhere classified: Secondary | ICD-10-CM | POA: Diagnosis not present

## 2023-10-22 NOTE — Therapy (Signed)
 OUTPATIENT PHYSICAL THERAPY TREATMENT   Patient Name: Angela Stark MRN: [id number] DOB:[birth year], 53 y.o., female Today's Date: 10/22/2023  END OF SESSION:  PT End of Session - 10/22/23 0812     Visit Number 4    Number of Visits 20    Date for PT Re-Evaluation 12/21/23    Authorization Type [facility] [employer] $25 copay    Progress Note Due on Visit 10    PT Start Time 0800    PT Stop Time 0839    PT Time Calculation (min) 39 min    Activity Tolerance Patient tolerated treatment well    Behavior During Therapy [facility] for tasks assessed/performed             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   Amenorrhea 05/28/2017   Anemia    Anxiety and depression 07/26/2006   Qualifier: Diagnosis of   By: Lorane Browning         Apnea 05/28/2017   ARF (acute renal failure) (HCC) 08/18/2011   Arthritis    Back pain    Bell's palsy 05/10/2023   Class 3 severe obesity due to excess calories with body mass index (BMI) of 40.0 to 44.9 in adult 08/27/2016   Dehydration 08/18/2011   Essential hypertension 07/30/2015   Fatty liver    GERD (gastroesophageal reflux disease)    Heart murmur    When pregnant   Hyperlipidemia    Hypertension    Hypokalemia 08/18/2011   Hyponatremia 08/18/2011   Joint pain    Knee pain    Muscle stiffness    Need for shingles vaccine 11/10/2021   Obstructive sleep apnea (adult) (pediatric) 05/10/2023   Other fatigue 10/18/2017   Palpitations 03/22/2023   Patellofemoral arthritis of left knee 09/05/2015   Phlebitis    Polysubstance dependence including opioid type drug, episodic abuse (HCC) 08/18/2011   Primary osteoarthritis of both knees 01/12/2018   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   Right ankle pain 09/05/2015   Right knee pain 10/19/2017   Right upper quadrant pain 07/30/2015   Screening for  diabetes mellitus 11/30/2014   Shortness of breath on exertion    Sleep apnea    Steatosis of liver 09/06/2017   Stress    Swelling of both lower extremities    Thrombocytopenia (HCC) 08/18/2011   Thyroid  nodule 08/09/2020   Tobacco use disorder 04/16/2015   Chronic      Trouble in sleeping    Vision disturbance 04/16/2015   1/17 ?etiology     Past Surgical History:  Procedure Laterality Date   COLONOSCOPY     OVARY SURGERY     TOTAL KNEE ARTHROPLASTY Right 10/08/2023   Procedure: ARTHROPLASTY, KNEE, TOTAL;  Surgeon: Vernetta Lonni GRADE, MD;  Location: [facility];  Service: Orthopedics;  Laterality: Right;   WISDOM TOOTH EXTRACTION     Patient Active Problem List   Diagnosis Date Noted   Status post total right knee replacement 10/08/2023   Bell's palsy 05/10/2023   Obstructive sleep apnea (adult) (pediatric) 05/10/2023   Anemia    Arthritis    Back pain    GERD (gastroesophageal reflux disease)    Hyperlipidemia    Joint pain    Muscle stiffness    Stress    Swelling of both lower extremities    Trouble in sleeping    Morbid obesity  with BMI of 40.0-44.9, adult (HCC) 03/22/2023   Palpitations 03/22/2023   Grieving 02/19/2022   Herpes zoster without complication 02/19/2022   Need for shingles vaccine 11/10/2021   Thyroid  nodule 08/09/2020   Heart murmur 08/09/2020   Phlebitis 11/03/2019   Elevated LDL cholesterol level 11/03/2019   Primary osteoarthritis of both knees 01/12/2018   Right knee pain 10/19/2017   Acute pain of left knee 10/19/2017   Other fatigue 10/18/2017   Shortness of breath on exertion 10/18/2017   Steatosis of liver 09/06/2017   Apnea 05/28/2017   Amenorrhea 05/28/2017   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   Class 3 severe obesity due to excess calories with body mass index (BMI) of 40.0 to 44.9 in adult 08/27/2016   Patellofemoral arthritis of left knee 09/05/2015   Right ankle pain 09/05/2015   Acquired supination of foot 09/05/2015    Essential hypertension 07/30/2015   Visual disturbance 07/30/2015   Right upper quadrant pain 07/30/2015   Vision disturbance 04/16/2015   Tobacco use disorder 04/16/2015   Screening for diabetes mellitus 11/30/2014   Encounter for screening mammogram for breast cancer 08/06/2014   Encounter for smoking cessation counseling 08/06/2014   Alcohol abuse 08/19/2011   Alcoholic hepatitis 08/18/2011   Dehydration 08/18/2011   ARF (acute renal failure) (HCC) 08/18/2011   Hypokalemia 08/18/2011   Hyponatremia 08/18/2011   Thrombocytopenia (HCC) 08/18/2011   Polysubstance dependence including opioid type drug, episodic abuse (HCC) 08/18/2011   Anxiety and depression 07/26/2006    PCP: Berneta Pouch MD  REFERRING PROVIDER: Vernetta Lonni GRADE, MD  REFERRING DIAG: [phone] (ICD-10-CM) - S/P total knee arthroplasty, right  THERAPY DIAG:  Chronic pain of right knee  Difficulty in walking, not elsewhere classified  Localized edema  Stiffness of right knee, not elsewhere classified  Muscle weakness (generalized)  Rationale for Evaluation and Treatment: Rehabilitation  ONSET DATE: Rt TKA 10/08/2023  SUBJECTIVE:   SUBJECTIVE STATEMENT: Pt indicated good MD visit.  Reported getting staples out. Rating stiffness as chief complaint.   PERTINENT HISTORY: Medical history: Anemia, aniexty, depression, arthritis, back pain, HTN, GERD, hyperlipidemia.  Has complaints of Lt knee.   PAIN:  NPRS scale: no pain upon arrival.  Pain location: Rt knee  Pain description: spasms, throbbing , spasms, tightness.  Aggravating factors: WB, prolonged standing activity, end ranges, nighttime, swelling.  Relieving factors: pain medicine, OTC medicine.   PRECAUTIONS: None  WEIGHT BEARING RESTRICTIONS: No  FALLS:  Has patient fallen in last 6 months? 1 fall in house.  No injury.  Was pre surgery.    Reported having a fear of falling.   LIVING ENVIRONMENT: Lives in:  House/apartment Stairs: 2-3 steps, no rail.  Has following equipment at home: FWW Has 2 dog   OCCUPATION: [employer] - standing/walking, etc.   PLOF: Independent, cooking, fishing, garden.  Walking dogs.   PATIENT GOALS: Reduce pain, get mobile, take a bath. Back to work.   OBJECTIVE:   PATIENT SURVEYS:  Patient-Specific Activity Scoring Scheme  0 represents "unable to perform." 10 represents "able to perform at prior level. 0 1 2 3 4 5 6 7 8 9  10 (Date and Score)   Activity Eval  10/12/2023    1. Squatting  0    2. Getting on knees  0    3. Getting a bath 0   4. Stairs  0   5.    Score 0    Total score = sum of the activity scores/number of activities Minimum  detectable change (90%CI) for average score = 2 points Minimum detectable change (90%CI) for single activity score = 3 points  COGNITION: 10/12/2023 Overall cognitive status: WFL    SENSATION: 10/12/2023 Not specifically tested.   EDEMA:  10/12/2023 Localized edema noted in Rt knee, lower leg.   MUSCLE LENGTH: 10/12/2023 No specific testing today  POSTURE:  10/12/2023 Weight shift to Lt in standing.   PALPATION: 10/12/2023 General tenderness around knee.   LOWER EXTREMITY ROM:   ROM Right Eval 10/12/2023 Left Eval 10/12/2023 Right 10/19/2023   Hip flexion     Hip extension     Hip abduction     Hip adduction     Hip internal rotation     Hip external rotation     Knee flexion 95 AROM in supine heel slide  100 AROM heel slide  Knee extension -6 AROM in seated LAQ  -3 PROM in supine heel prop    Ankle dorsiflexion     Ankle plantarflexion     Ankle inversion     Ankle eversion      (Blank rows = not tested)  LOWER EXTREMITY MMT:  MMT Right Eval 10/12/2023 Left Eval 10/12/2023  Hip flexion 5/5 5/5  Hip extension    Hip abduction    Hip adduction    Hip internal rotation    Hip external rotation    Knee flexion 4/5   Knee extension 4/5   Ankle dorsiflexion 5/5  5/5  Ankle plantarflexion    Ankle inversion    Ankle eversion     (Blank rows = not tested)  LOWER EXTREMITY SPECIAL TESTS:  10/12/2023 No specific testing.   FUNCTIONAL TESTS:  10/12/2023 18 inch chair transfer: able to perform on 1st attempt with Lt leg dominant.    GAIT: 10/12/2023 Modified independent ambulation c FWW, reduced stance on Rt leg with reduced step length Lt leg.  Maintained knee flexion in stance lacking full TKE                                                                                                                                                                        TODAY'S TREATMENT                                                                          DATE: 10/22/2023 Therex: Recumbent bike lvl 2 8 mins  Seated Rt leg quad set 5 sec hold x 10 Seated Rt leg quad  set with SLR x 15 (cues for home use. )   TherActivity Driving replication with blazepod red/green reactive light with 1.75 second time out with random sequencing 30 sec x 5 Leg press double leg 87 lbs x 15, single leg 31 lbs 2 x 15 bilateral   Neuro Re-ed Tandem stance on foam 1 min x 2 bilateral with occasional HHA on bars.  Tandem ambulation on ground in // bars 10 ft x 5 each way with occasional HHA Standing feet together across foam bar 1 min balance ankle strategy control SLS with contralateral leg tapping corners of black mat x 8 each, performed bilaterally    TODAY'S TREATMENT                                                                          DATE: 10/19/2023 Therex: UBE UE/LE for knee ROM lvl 2.5 8 mins Seated Rt leg LAQ with end range pauses both directions with contralateral leg movement opposite 3 lbs 2 x 15 Supine Rt knee heel slide AROM 5 sec hold, quad set 5 sec hold combo x 10  Supine Rt leg quad set with SLR x 15 Supine heel prop Rt with start of vaso for extension gains to tolerance x5    Gait Training(to improve quality and mechanisms of gait) Step to pattern  over 6 inch hurdle in // bars with occasional to moderate HHA with focus on knee flexion clearing step.  10 ft x x 5 leading with each LE first Step over/back 4 inch step with anterior/posterior weight shift to improve swing phase and weight shift control x 10 bilateral in // bars with occasional HHA  Manual Rt knee flexion c distraction IR mobilization with movement for flexion gains with contralateral leg movement opposite.   Vaso 10 mins medium compression Rt knee in elevation 34 deg with TKE stretch at start    TODAY'S TREATMENT                                                                          DATE: 10/14/2023 Therex:  Nustep 8 min level 3   Supine heel slides 2x10 with 3 sec hold at the end range Supine quad sets 20x with 5 sec hold Seated marching 20x2 LAQ 20x HEP review  Neuro Re ed: Step ups on 4 inch step  2x10 Lean in for flexion- 6 inch step- 10 sec x10  Standing on airex with perturbations for balance   Vaso 38 degrees 10 min     PATIENT EDUCATION:  10/22/2023 Education details: HEP update Person educated: Patient Education method: Programmer, multimedia, Demonstration, Verbal cues, and Handouts Education comprehension: verbalized understanding, returned demonstration, and verbal cues required  HOME EXERCISE PROGRAM: Access Code: [email or web address] URL: [email or web address] Date: 10/22/2023 Prepared by: Ozell Silvan  Exercises - Supine Heel Slide  - 3-5 x daily - 7 x weekly - 1 sets - 10 reps - 5 hold - Supine Heel Slide with Strap (Mirrored)  -  3-5 x daily - 7 x weekly - 1 sets - 10 reps - 5 hold - Seated Long Arc Quad  - 3-5 x daily - 7 x weekly - 1 sets - 5-10 reps - 2 hold - Supine Knee Extension Mobilization with Weight  - 4-5 x daily - 7 x weekly - 1 sets - 1 reps - to tolerance up to 5 mins hold - Seated Knee Flexion AAROM  - 2-3 x daily - 7 x weekly - 1 sets - 5 reps - 10-15 hold - Seated Quad Set  - 3-5 x daily - 7 x weekly - 1 sets - 10 reps - 5  hold - Seated SLR  - 1-2 x daily - 7 x weekly - 1-2 sets - 10-15 reps - 2 hold  ASSESSMENT:  CLINICAL IMPRESSION: Able to drive today to clinic.  Continued quad strengthening added and indicated to continue to help improve WB control and functional leg lift for driving, transfers in bed/car.  Continued skilled PT services indicated at this time.   OBJECTIVE IMPAIRMENTS: Abnormal gait, decreased activity tolerance, decreased balance, decreased coordination, decreased endurance, decreased mobility, difficulty walking, decreased ROM, decreased strength, hypomobility, increased edema, increased fascial restrictions, impaired perceived functional ability, increased muscle spasms, impaired flexibility, improper body mechanics, postural dysfunction, and pain.   ACTIVITY LIMITATIONS: carrying, lifting, bending, sitting, standing, squatting, sleeping, stairs, transfers, bed mobility, bathing, toileting, dressing, and locomotion level  PARTICIPATION LIMITATIONS: meal prep, cleaning, laundry, interpersonal relationship, driving, shopping, and community activity  PERSONAL FACTORS: Medical history: Anemia, aniexty, depression, arthritis, back pain, HTN, GERD, hyperlipidemia,  are also affecting patient's functional outcome.   REHAB POTENTIAL: Good  CLINICAL DECISION MAKING: Evolving/moderate complexity  EVALUATION COMPLEXITY: Moderate   GOALS: Goals reviewed with patient? Yes  SHORT TERM GOALS: (target date for Short term goals are 3 weeks 11/02/2023)   1.  Patient will demonstrate independent use of home exercise program to maintain progress from in clinic treatments.  Goal status: on going 10/22/2023  LONG TERM GOALS: (target dates for all long term goals are 10 weeks  12/21/2023 )   1. Patient will demonstrate/report pain at worst less than or equal to 2/10 to facilitate minimal limitation in daily activity secondary to pain symptoms.  Goal status: New   2. Patient will demonstrate  independent use of home exercise program to facilitate ability to maintain/progress functional gains from skilled physical therapy services.  Goal status: New   3. Patient will demonstrate Patient specific functional scale avg > or = 8/10 to indicate reduced disability due to condition.   Goal status: New   4.  Patient will demonstrate Rt LE MMT 5/5 throughout to faciltiate usual transfers, stairs, squatting at [facility] for daily life.   Goal status: New   5.  Patient will demonstrate Rt knee 0-110 deg AROM for transfers, mobility at PLOF in daily life.  Goal status: New   6.  Patient will demonstrate ascending/descending stairs reciprocally s UE assist for community integration.   Goal status: New   7.  Patient will demonstrate independent ambulation > 500 ft s deviation for community integration.  Goal Status: New   PLAN:  PT FREQUENCY: 1-2x/week  PT DURATION: 10 weeks  PLANNED INTERVENTIONS: Can include 02853- PT Re-evaluation, 97110-Therapeutic exercises, 97530- Therapeutic activity, [id number]- Neuromuscular re-education, 97535- Self Care, 97140- Manual therapy, [id number]- Gait training, [phone]- Orthotic Fit/training, [phone]- Canalith repositioning, [id number]- Aquatic Therapy, H9716- Electrical stimulation (unattended), [id number] Physical performance testing, 97016- Vasopneumatic device, [id number]- Ultrasound, [id number]-  Traction (mechanical), 02966- Ionotophoresis 4mg /ml Dexamethasone ,  79439 - Needle insertion w/o injection 1 or 2 muscles, 20561 - Needle insertion w/o injection 3 or more muscles.   Patient/Family education, Balance training, Stair training, Taping, Dry Needling, Joint mobilization, Joint manipulation, Spinal manipulation, Spinal mobilization, Scar mobilization, Vestibular training, Visual/preceptual remediation/compensation, DME instructions, Cryotherapy, and Moist heat.  All performed as medically necessary.  All included unless contraindicated  PLAN FOR NEXT SESSION:  Progressive  strengthening and balance improvmeent.s    Ozell Silvan, PT, DPT, OCS, ATC 10/22/23  8:40 AM

## 2023-10-25 ENCOUNTER — Encounter (HOSPITAL_COMMUNITY): Payer: Self-pay | Admitting: Orthopaedic Surgery

## 2023-10-25 NOTE — Therapy (Incomplete)
 OUTPATIENT PHYSICAL THERAPY TREATMENT   Patient Name: Angela Stark MRN: [id number] DOB:[birth year], 53 y.o., female Today's Date: 10/25/2023  END OF SESSION:***       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   Amenorrhea 05/28/2017   Anemia    Anxiety and depression 07/26/2006   Qualifier: Diagnosis of   By: Lorane Browning         Apnea 05/28/2017   ARF (acute renal failure) (HCC) 08/18/2011   Arthritis    Back pain    Bell's palsy 05/10/2023   Class 3 severe obesity due to excess calories with body mass index (BMI) of 40.0 to 44.9 in adult 08/27/2016   Dehydration 08/18/2011   Essential hypertension 07/30/2015   Fatty liver    GERD (gastroesophageal reflux disease)    Heart murmur    When pregnant   Hyperlipidemia    Hypertension    Hypokalemia 08/18/2011   Hyponatremia 08/18/2011   Joint pain    Knee pain    Muscle stiffness    Need for shingles vaccine 11/10/2021   Obstructive sleep apnea (adult) (pediatric) 05/10/2023   Other fatigue 10/18/2017   Palpitations 03/22/2023   Patellofemoral arthritis of left knee 09/05/2015   Phlebitis    Polysubstance dependence including opioid type drug, episodic abuse (HCC) 08/18/2011   Primary osteoarthritis of both knees 01/12/2018   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   Right ankle pain 09/05/2015   Right knee pain 10/19/2017   Right upper quadrant pain 07/30/2015   Screening for diabetes mellitus 11/30/2014   Shortness of breath on exertion    Sleep apnea    Steatosis of liver 09/06/2017   Stress    Swelling of both lower extremities    Thrombocytopenia (HCC) 08/18/2011   Thyroid  nodule 08/09/2020   Tobacco use disorder 04/16/2015   Chronic      Trouble in sleeping    Vision disturbance 04/16/2015   1/17 ?etiology     Past Surgical History:  Procedure Laterality  Date   COLONOSCOPY     OVARY SURGERY     TOTAL KNEE ARTHROPLASTY Right 10/08/2023   Procedure: ARTHROPLASTY, KNEE, TOTAL;  Surgeon: Vernetta Lonni GRADE, MD;  Location: [facility];  Service: Orthopedics;  Laterality: Right;   WISDOM TOOTH EXTRACTION     Patient Active Problem List   Diagnosis Date Noted   Status post total right knee replacement 10/08/2023   Bell's palsy 05/10/2023   Obstructive sleep apnea (adult) (pediatric) 05/10/2023   Anemia    Arthritis    Back pain    GERD (gastroesophageal reflux disease)    Hyperlipidemia    Joint pain    Muscle stiffness    Stress    Swelling of both lower extremities    Trouble in sleeping    Morbid obesity with BMI of 40.0-44.9, adult (HCC) 03/22/2023   Palpitations 03/22/2023   Grieving 02/19/2022   Herpes zoster without complication 02/19/2022   Need for shingles vaccine 11/10/2021   Thyroid  nodule 08/09/2020   Heart murmur 08/09/2020   Phlebitis 11/03/2019   Elevated LDL cholesterol level 11/03/2019   Primary osteoarthritis of both knees 01/12/2018   Right knee pain 10/19/2017   Acute pain of left knee 10/19/2017   Other fatigue 10/18/2017   Shortness of breath on exertion 10/18/2017   Steatosis of liver 09/06/2017   Apnea 05/28/2017  Amenorrhea 05/28/2017   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   Class 3 severe obesity due to excess calories with body mass index (BMI) of 40.0 to 44.9 in adult 08/27/2016   Patellofemoral arthritis of left knee 09/05/2015   Right ankle pain 09/05/2015   Acquired supination of foot 09/05/2015   Essential hypertension 07/30/2015   Visual disturbance 07/30/2015   Right upper quadrant pain 07/30/2015   Vision disturbance 04/16/2015   Tobacco use disorder 04/16/2015   Screening for diabetes mellitus 11/30/2014   Encounter for screening mammogram for breast cancer 08/06/2014   Encounter for smoking cessation counseling 08/06/2014   Alcohol abuse 08/19/2011   Alcoholic hepatitis 08/18/2011    Dehydration 08/18/2011   ARF (acute renal failure) (HCC) 08/18/2011   Hypokalemia 08/18/2011   Hyponatremia 08/18/2011   Thrombocytopenia (HCC) 08/18/2011   Polysubstance dependence including opioid type drug, episodic abuse (HCC) 08/18/2011   Anxiety and depression 07/26/2006    PCP: Berneta Pouch MD  REFERRING PROVIDER: Vernetta Lonni GRADE, MD  REFERRING DIAG: [phone] (ICD-10-CM) - S/P total knee arthroplasty, right  THERAPY DIAG:  No diagnosis found.  Rationale for Evaluation and Treatment: Rehabilitation  ONSET DATE: Rt TKA 10/08/2023  SUBJECTIVE:   SUBJECTIVE STATEMENT: ****Pt indicated good MD visit.  Reported getting staples out. Rating stiffness as chief complaint.   PERTINENT HISTORY: Medical history: Anemia, aniexty, depression, arthritis, back pain, HTN, GERD, hyperlipidemia.  Has complaints of Lt knee.   PAIN:  ***NPRS scale: no pain upon arrival.  Pain location: Rt knee  Pain description: spasms, throbbing , spasms, tightness.  Aggravating factors: WB, prolonged standing activity, end ranges, nighttime, swelling.  Relieving factors: pain medicine, OTC medicine.   PRECAUTIONS: None  WEIGHT BEARING RESTRICTIONS: No  FALLS:  Has patient fallen in last 6 months? 1 fall in house.  No injury.  Was pre surgery.    Reported having a fear of falling.   LIVING ENVIRONMENT: Lives in: House/apartment Stairs: 2-3 steps, no rail.  Has following equipment at home: FWW Has 2 dog   OCCUPATION: [employer] - standing/walking, etc.   PLOF: Independent, cooking, fishing, garden.  Walking dogs.   PATIENT GOALS: Reduce pain, get mobile, take a bath. Back to work.   OBJECTIVE:   PATIENT SURVEYS:  Patient-Specific Activity Scoring Scheme  0 represents "unable to perform." 10 represents "able to perform at prior level. 0 1 2 3 4 5 6 7 8 9  10 (Date and Score)   Activity Eval  10/12/2023    1. Squatting  0    2. Getting on knees  0     3. Getting a bath 0   4. Stairs  0   5.    Score 0    Total score = sum of the activity scores/number of activities Minimum detectable change (90%CI) for average score = 2 points Minimum detectable change (90%CI) for single activity score = 3 points  COGNITION: 10/12/2023 Overall cognitive status: WFL    SENSATION: 10/12/2023 Not specifically tested.   EDEMA:  10/12/2023 Localized edema noted in Rt knee, lower leg.   MUSCLE LENGTH: 10/12/2023 No specific testing today  POSTURE:  10/12/2023 Weight shift to Lt in standing.   PALPATION: 10/12/2023 General tenderness around knee.   LOWER EXTREMITY ROM:   ROM Right Eval 10/12/2023 Left Eval 10/12/2023 Right 10/19/2023   Hip flexion     Hip extension     Hip abduction     Hip adduction     Hip internal rotation  Hip external rotation     Knee flexion 95 AROM in supine heel slide  100 AROM heel slide  Knee extension -6 AROM in seated LAQ  -3 PROM in supine heel prop    Ankle dorsiflexion     Ankle plantarflexion     Ankle inversion     Ankle eversion      (Blank rows = not tested)  LOWER EXTREMITY MMT:  MMT Right Eval 10/12/2023 Left Eval 10/12/2023  Hip flexion 5/5 5/5  Hip extension    Hip abduction    Hip adduction    Hip internal rotation    Hip external rotation    Knee flexion 4/5   Knee extension 4/5   Ankle dorsiflexion 5/5 5/5  Ankle plantarflexion    Ankle inversion    Ankle eversion     (Blank rows = not tested)  LOWER EXTREMITY SPECIAL TESTS:  10/12/2023 No specific testing.   FUNCTIONAL TESTS:  10/12/2023 18 inch chair transfer: able to perform on 1st attempt with Lt leg dominant.    GAIT: 10/12/2023 Modified independent ambulation c FWW, reduced stance on Rt leg with reduced step length Lt leg.  Maintained knee flexion in stance lacking full TKE                                                                                                                                                                         TODAY'S TREATMENT                                                                          DATE: 10/22/2023 Therex: Recumbent bike lvl 2 8 mins  Seated Rt leg quad set 5 sec hold x 10 Seated Rt leg quad set with SLR x 15 (cues for home use. )   TherActivity Driving replication with blazepod red/green reactive light with 1.75 second time out with random sequencing 30 sec x 5 Leg press double leg 87 lbs x 15, single leg 31 lbs 2 x 15 bilateral   Neuro Re-ed Tandem stance on foam 1 min x 2 bilateral with occasional HHA on bars.  Tandem ambulation on ground in // bars 10 ft x 5 each way with occasional HHA Standing feet together across foam bar 1 min balance ankle strategy control SLS with contralateral leg tapping corners of black mat x 8 each, performed bilaterally    TODAY'S TREATMENT  DATE:  10/26/23***       10/19/2023 Therex: UBE UE/LE for knee ROM lvl 2.5 8 mins Seated Rt leg LAQ with end range pauses both directions with contralateral leg movement opposite 3 lbs 2 x 15 Supine Rt knee heel slide AROM 5 sec hold, quad set 5 sec hold combo x 10  Supine Rt leg quad set with SLR x 15 Supine heel prop Rt with start of vaso for extension gains to tolerance x5    Gait Training(to improve quality and mechanisms of gait) Step to pattern over 6 inch hurdle in // bars with occasional to moderate HHA with focus on knee flexion clearing step.  10 ft x x 5 leading with each LE first Step over/back 4 inch step with anterior/posterior weight shift to improve swing phase and weight shift control x 10 bilateral in // bars with occasional HHA  Manual Rt knee flexion c distraction IR mobilization with movement for flexion gains with contralateral leg movement opposite.   Vaso 10 mins medium compression Rt knee in elevation 34 deg with TKE stretch at start    TODAY'S TREATMENT                                                                           DATE: 10/14/2023 Therex:  Nustep 8 min level 3   Supine heel slides 2x10 with 3 sec hold at the end range Supine quad sets 20x with 5 sec hold Seated marching 20x2 LAQ 20x HEP review  Neuro Re ed: Step ups on 4 inch step  2x10 Lean in for flexion- 6 inch step- 10 sec x10  Standing on airex with perturbations for balance   Vaso 38 degrees 10 min     PATIENT EDUCATION:  10/22/2023 Education details: HEP update Person educated: Patient Education method: Programmer, multimedia, Demonstration, Verbal cues, and Handouts Education comprehension: verbalized understanding, returned demonstration, and verbal cues required  HOME EXERCISE PROGRAM: Access Code: [email or web address] URL: [email or web address] Date: 10/22/2023 Prepared by: Ozell Silvan  Exercises - Supine Heel Slide  - 3-5 x daily - 7 x weekly - 1 sets - 10 reps - 5 hold - Supine Heel Slide with Strap (Mirrored)  - 3-5 x daily - 7 x weekly - 1 sets - 10 reps - 5 hold - Seated Long Arc Quad  - 3-5 x daily - 7 x weekly - 1 sets - 5-10 reps - 2 hold - Supine Knee Extension Mobilization with Weight  - 4-5 x daily - 7 x weekly - 1 sets - 1 reps - to tolerance up to 5 mins hold - Seated Knee Flexion AAROM  - 2-3 x daily - 7 x weekly - 1 sets - 5 reps - 10-15 hold - Seated Quad Set  - 3-5 x daily - 7 x weekly - 1 sets - 10 reps - 5 hold - Seated SLR  - 1-2 x daily - 7 x weekly - 1-2 sets - 10-15 reps - 2 hold  ASSESSMENT:  CLINICAL IMPRESSION: ***Able to drive today to clinic.  Continued quad strengthening added and indicated to continue to help improve WB control and functional leg lift for driving, transfers in bed/car.  Continued skilled PT services indicated at this  time.   OBJECTIVE IMPAIRMENTS: Abnormal gait, decreased activity tolerance, decreased balance, decreased coordination, decreased endurance, decreased mobility, difficulty walking, decreased ROM, decreased  strength, hypomobility, increased edema, increased fascial restrictions, impaired perceived functional ability, increased muscle spasms, impaired flexibility, improper body mechanics, postural dysfunction, and pain.   ACTIVITY LIMITATIONS: carrying, lifting, bending, sitting, standing, squatting, sleeping, stairs, transfers, bed mobility, bathing, toileting, dressing, and locomotion level  PARTICIPATION LIMITATIONS: meal prep, cleaning, laundry, interpersonal relationship, driving, shopping, and community activity  PERSONAL FACTORS: Medical history: Anemia, aniexty, depression, arthritis, back pain, HTN, GERD, hyperlipidemia,  are also affecting patient's functional outcome.   REHAB POTENTIAL: Good  CLINICAL DECISION MAKING: Evolving/moderate complexity  EVALUATION COMPLEXITY: Moderate   GOALS: Goals reviewed with patient? Yes  SHORT TERM GOALS: (target date for Short term goals are 3 weeks 11/02/2023)   1.  Patient will demonstrate independent use of home exercise program to maintain progress from in clinic treatments.  Goal status: on going 10/22/2023  LONG TERM GOALS: (target dates for all long term goals are 10 weeks  12/21/2023 )   1. Patient will demonstrate/report pain at worst less than or equal to 2/10 to facilitate minimal limitation in daily activity secondary to pain symptoms.  Goal status: New   2. Patient will demonstrate independent use of home exercise program to facilitate ability to maintain/progress functional gains from skilled physical therapy services.  Goal status: New   3. Patient will demonstrate Patient specific functional scale avg > or = 8/10 to indicate reduced disability due to condition.   Goal status: New   4.  Patient will demonstrate Rt LE MMT 5/5 throughout to faciltiate usual transfers, stairs, squatting at [facility] for daily life.   Goal status: New   5.  Patient will demonstrate Rt knee 0-110 deg AROM for transfers, mobility at PLOF in daily  life.  Goal status: New   6.  Patient will demonstrate ascending/descending stairs reciprocally s UE assist for community integration.   Goal status: New   7.  Patient will demonstrate independent ambulation > 500 ft s deviation for community integration.  Goal Status: New   PLAN:  PT FREQUENCY: 1-2x/week  PT DURATION: 10 weeks  PLANNED INTERVENTIONS: Can include 02853- PT Re-evaluation, 97110-Therapeutic exercises, 97530- Therapeutic activity, [id number]- Neuromuscular re-education, 97535- Self Care, 97140- Manual therapy, [phone]- Gait training, [phone]- Orthotic Fit/training, [phone]- Canalith repositioning, [id number]- Aquatic Therapy, [phone]- Electrical stimulation (unattended), [id number] Physical performance testing, 97016- Vasopneumatic device, [id number]- Ultrasound, [id number]- Traction (mechanical), [id number]- Ionotophoresis 4mg /ml Dexamethasone ,  [id number] - Needle insertion w/o injection 1 or 2 muscles, 20561 - Needle insertion w/o injection 3 or more muscles.   Patient/Family education, Balance training, Stair training, Taping, Dry Needling, Joint mobilization, Joint manipulation, Spinal manipulation, Spinal mobilization, Scar mobilization, Vestibular training, Visual/preceptual remediation/compensation, DME instructions, Cryotherapy, and Moist heat.  All performed as medically necessary.  All included unless contraindicated  PLAN FOR NEXT SESSION:  ***Progressive strengthening and balance improvmeent.s    Burnard Meth, PT 10/25/23  4:02 PM

## 2023-10-25 NOTE — Addendum Note (Signed)
 Addendum  created 10/25/23 0759 by Merla Almarie HERO, DO   Intraprocedure Event edited, Intraprocedure Meds edited

## 2023-10-26 ENCOUNTER — Encounter

## 2023-10-29 ENCOUNTER — Encounter: Admitting: Physical Therapy

## 2023-11-02 ENCOUNTER — Encounter: Admitting: Physical Therapy

## 2023-11-03 ENCOUNTER — Ambulatory Visit: Admitting: Physical Therapy

## 2023-11-03 ENCOUNTER — Encounter: Payer: Self-pay | Admitting: Physical Therapy

## 2023-11-03 DIAGNOSIS — R600 Localized edema: Secondary | ICD-10-CM

## 2023-11-03 DIAGNOSIS — R262 Difficulty in walking, not elsewhere classified: Secondary | ICD-10-CM

## 2023-11-03 DIAGNOSIS — M6281 Muscle weakness (generalized): Secondary | ICD-10-CM | POA: Diagnosis not present

## 2023-11-03 DIAGNOSIS — M25561 Pain in right knee: Secondary | ICD-10-CM | POA: Diagnosis not present

## 2023-11-03 DIAGNOSIS — M25661 Stiffness of right knee, not elsewhere classified: Secondary | ICD-10-CM | POA: Diagnosis not present

## 2023-11-03 DIAGNOSIS — G8929 Other chronic pain: Secondary | ICD-10-CM

## 2023-11-03 NOTE — Therapy (Signed)
 OUTPATIENT PHYSICAL THERAPY TREATMENT   Patient Name: Angela Stark MRN: [id number] DOB:Sep 10, 1970, 53 y.o., female Today's Date: 11/03/2023  END OF SESSION:  PT End of Session - 11/03/23 1432     Visit Number 5    Number of Visits 20    Date for PT Re-Evaluation 12/21/23    Authorization Type [facility] [employer] $25 copay    Progress Note Due on Visit 10    PT Start Time 1432    PT Stop Time 1511    PT Time Calculation (min) 39 min    Activity Tolerance Patient tolerated treatment well    Behavior During Therapy [facility] for tasks assessed/performed              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   Amenorrhea 05/28/2017   Anemia    Anxiety and depression 07/26/2006   Qualifier: Diagnosis of   By: Lorane Browning         Apnea 05/28/2017   ARF (acute renal failure) (HCC) 08/18/2011   Arthritis    Back pain    Bell's palsy 05/10/2023   Class 3 severe obesity due to excess calories with body mass index (BMI) of 40.0 to 44.9 in adult 08/27/2016   Dehydration 08/18/2011   Essential hypertension 07/30/2015   Fatty liver    GERD (gastroesophageal reflux disease)    Heart murmur    When pregnant   Hyperlipidemia    Hypertension    Hypokalemia 08/18/2011   Hyponatremia 08/18/2011   Joint pain    Knee pain    Muscle stiffness    Need for shingles vaccine 11/10/2021   Obstructive sleep apnea (adult) (pediatric) 05/10/2023   Other fatigue 10/18/2017   Palpitations 03/22/2023   Patellofemoral arthritis of left knee 09/05/2015   Phlebitis    Polysubstance dependence including opioid type drug, episodic abuse (HCC) 08/18/2011   Primary osteoarthritis of both knees 01/12/2018   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   Right ankle pain 09/05/2015   Right knee pain 10/19/2017   Right upper quadrant pain 07/30/2015   Screening for  diabetes mellitus 11/30/2014   Shortness of breath on exertion    Sleep apnea    Steatosis of liver 09/06/2017   Stress    Swelling of both lower extremities    Thrombocytopenia (HCC) 08/18/2011   Thyroid  nodule 08/09/2020   Tobacco use disorder 04/16/2015   Chronic      Trouble in sleeping    Vision disturbance 04/16/2015   1/17 ?etiology     Past Surgical History:  Procedure Laterality Date   COLONOSCOPY     OVARY SURGERY     TOTAL KNEE ARTHROPLASTY Right 10/08/2023   Procedure: ARTHROPLASTY, KNEE, TOTAL;  Surgeon: Vernetta Lonni GRADE, MD;  Location: [facility];  Service: Orthopedics;  Laterality: Right;   WISDOM TOOTH EXTRACTION     Patient Active Problem List   Diagnosis Date Noted   Status post total right knee replacement 10/08/2023   Bell's palsy 05/10/2023   Obstructive sleep apnea (adult) (pediatric) 05/10/2023   Anemia    Arthritis    Back pain    GERD (gastroesophageal reflux disease)    Hyperlipidemia    Joint pain    Muscle stiffness    Stress    Swelling of both lower extremities    Trouble in sleeping    Morbid  obesity with BMI of 40.0-44.9, adult (HCC) 03/22/2023   Palpitations 03/22/2023   Grieving 02/19/2022   Herpes zoster without complication 02/19/2022   Need for shingles vaccine 11/10/2021   Thyroid  nodule 08/09/2020   Heart murmur 08/09/2020   Phlebitis 11/03/2019   Elevated LDL cholesterol level 11/03/2019   Primary osteoarthritis of both knees 01/12/2018   Right knee pain 10/19/2017   Acute pain of left knee 10/19/2017   Other fatigue 10/18/2017   Shortness of breath on exertion 10/18/2017   Steatosis of liver 09/06/2017   Apnea 05/28/2017   Amenorrhea 05/28/2017   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   Class 3 severe obesity due to excess calories with body mass index (BMI) of 40.0 to 44.9 in adult 08/27/2016   Patellofemoral arthritis of left knee 09/05/2015   Right ankle pain 09/05/2015   Acquired supination of foot 09/05/2015    Essential hypertension 07/30/2015   Visual disturbance 07/30/2015   Right upper quadrant pain 07/30/2015   Vision disturbance 04/16/2015   Tobacco use disorder 04/16/2015   Screening for diabetes mellitus 11/30/2014   Encounter for screening mammogram for breast cancer 08/06/2014   Encounter for smoking cessation counseling 08/06/2014   Alcohol abuse 08/19/2011   Alcoholic hepatitis 08/18/2011   Dehydration 08/18/2011   ARF (acute renal failure) (HCC) 08/18/2011   Hypokalemia 08/18/2011   Hyponatremia 08/18/2011   Thrombocytopenia (HCC) 08/18/2011   Polysubstance dependence including opioid type drug, episodic abuse (HCC) 08/18/2011   Anxiety and depression 07/26/2006    PCP: Berneta Pouch MD  REFERRING PROVIDER: Vernetta Lonni GRADE, MD  REFERRING DIAG: [phone] (ICD-10-CM) - S/P total knee arthroplasty, right  THERAPY DIAG:  Chronic pain of right knee  Localized edema  Difficulty in walking, not elsewhere classified  Stiffness of right knee, not elsewhere classified  Muscle weakness (generalized)  Rationale for Evaluation and Treatment: Rehabilitation  ONSET DATE: Rt TKA 10/08/2023  SUBJECTIVE:   SUBJECTIVE STATEMENT: Doing well, no significant complaints todya Pt indicated good MD visit.  Reported getting staples out. Rating stiffness as chief complaint.   PERTINENT HISTORY: Medical history: Anemia, aniexty, depression, arthritis, back pain, HTN, GERD, hyperlipidemia.  Has complaints of Lt knee.   PAIN:  NPRS scale: no pain upon arrival.  Pain location: Rt knee  Pain description: spasms, throbbing , spasms, tightness.  Aggravating factors: WB, prolonged standing activity, end ranges, nighttime, swelling.  Relieving factors: pain medicine, OTC medicine.   PRECAUTIONS: None  WEIGHT BEARING RESTRICTIONS: No  FALLS:  Has patient fallen in last 6 months? 1 fall in house.  No injury.  Was pre surgery.    Reported having a fear of falling.   LIVING  ENVIRONMENT: Lives in: House/apartment Stairs: 2-3 steps, no rail.  Has following equipment at home: FWW Has 2 dog   OCCUPATION: [employer] - standing/walking, etc.   PLOF: Independent, cooking, fishing, garden.  Walking dogs.   PATIENT GOALS: Reduce pain, get mobile, take a bath. Back to work.   OBJECTIVE:   PATIENT SURVEYS:  Patient-Specific Activity Scoring Scheme  0 represents "unable to perform." 10 represents "able to perform at prior level. 0 1 2 3 4 5 6 7 8 9  10 (Date and Score)   Activity Eval  10/12/2023    1. Squatting  0    2. Getting on knees  0    3. Getting a bath 0   4. Stairs  0   5.    Score 0    Total score = sum  of the activity scores/number of activities Minimum detectable change (90%CI) for average score = 2 points Minimum detectable change (90%CI) for single activity score = 3 points  COGNITION: 10/12/2023 Overall cognitive status: WFL    SENSATION: 10/12/2023 Not specifically tested.   EDEMA:  10/12/2023 Localized edema noted in Rt knee, lower leg.   MUSCLE LENGTH: 10/12/2023 No specific testing today  POSTURE:  10/12/2023 Weight shift to Lt in standing.   PALPATION: 10/12/2023 General tenderness around knee.   LOWER EXTREMITY ROM:   ROM Right Eval 10/12/2023 Left Eval 10/12/2023 Right 10/19/2023  Right 11/03/23  Knee flexion 95 AROM in supine heel slide  100 AROM heel slide A: 116  AA: 118  Knee extension -6 AROM in seated LAQ  -3 PROM in supine heel prop      (Blank rows = not tested)  LOWER EXTREMITY MMT:  MMT Right Eval 10/12/2023 Left Eval 10/12/2023  Hip flexion 5/5 5/5  Hip extension    Hip abduction    Hip adduction    Hip internal rotation    Hip external rotation    Knee flexion 4/5   Knee extension 4/5   Ankle dorsiflexion 5/5 5/5  Ankle plantarflexion    Ankle inversion    Ankle eversion     (Blank rows = not tested)  LOWER EXTREMITY SPECIAL TESTS:  10/12/2023 No specific  testing.   FUNCTIONAL TESTS:  10/12/2023 18 inch chair transfer: able to perform on 1st attempt with Lt leg dominant.    GAIT: 10/12/2023 Modified independent ambulation c FWW, reduced stance on Rt leg with reduced step length Lt leg.  Maintained knee flexion in stance lacking full TKE                                                                                                                                                                        TODAY'S TREATMENT 11/03/23 TherEx Recumbent bike x 8 min; L4; seat 6 Knee extension 10# RLE only 3x10 ROM measurements - see above for details AA heel slides with ball and strap x 10 reps  TherAct Leg Press bil 100# 3x10; then single leg performed bil 50# 3x10 TRX squats 3x10 - cues for midline awareness  Neuro Re-ed Side stepping on foam x 5 laps without UE support Tandem walking on foam x 5 laps (fwd/bwd) with intermittent UE support SLS on Rt on foam with Lt taps to Blaze pods, 30 sec on/30 sec rest x 3 cycles    10/22/2023 Therex: Recumbent bike lvl 2 8 mins  Seated Rt leg quad set 5 sec hold x 10 Seated Rt leg quad set with SLR x 15 (cues for home use. )   TherActivity Driving replication with blazepod red/green reactive  light with 1.75 second time out with random sequencing 30 sec x 5 Leg press double leg 87 lbs x 15, single leg 31 lbs 2 x 15 bilateral   Neuro Re-ed Tandem stance on foam 1 min x 2 bilateral with occasional HHA on bars.  Tandem ambulation on ground in // bars 10 ft x 5 each way with occasional HHA Standing feet together across foam bar 1 min balance ankle strategy control SLS with contralateral leg tapping corners of black mat x 8 each, performed bilaterally    10/19/2023 Therex: UBE UE/LE for knee ROM lvl 2.5 8 mins Seated Rt leg LAQ with end range pauses both directions with contralateral leg movement opposite 3 lbs 2 x 15 Supine Rt knee heel slide AROM 5 sec hold, quad set 5 sec hold combo x 10   Supine Rt leg quad set with SLR x 15 Supine heel prop Rt with start of vaso for extension gains to tolerance x5    Gait Training(to improve quality and mechanisms of gait) Step to pattern over 6 inch hurdle in // bars with occasional to moderate HHA with focus on knee flexion clearing step.  10 ft x x 5 leading with each LE first Step over/back 4 inch step with anterior/posterior weight shift to improve swing phase and weight shift control x 10 bilateral in // bars with occasional HHA  Manual Rt knee flexion c distraction IR mobilization with movement for flexion gains with contralateral leg movement opposite.   Vaso 10 mins medium compression Rt knee in elevation 34 deg with TKE stretch at start    10/14/2023 Therex:  Nustep 8 min level 3   Supine heel slides 2x10 with 3 sec hold at the end range Supine quad sets 20x with 5 sec hold Seated marching 20x2 LAQ 20x HEP review  Neuro Re ed: Step ups on 4 inch step  2x10 Lean in for flexion- 6 inch step- 10 sec x10  Standing on airex with perturbations for balance   Vaso 38 degrees 10 min     PATIENT EDUCATION:  10/22/2023 Education details: HEP update Person educated: Patient Education method: Programmer, multimedia, Demonstration, Verbal cues, and Handouts Education comprehension: verbalized understanding, returned demonstration, and verbal cues required  HOME EXERCISE PROGRAM: Access Code: [email or web address] URL: [email or web address] Date: 10/22/2023 Prepared by: Ozell Silvan  Exercises - Supine Heel Slide  - 3-5 x daily - 7 x weekly - 1 sets - 10 reps - 5 hold - Supine Heel Slide with Strap (Mirrored)  - 3-5 x daily - 7 x weekly - 1 sets - 10 reps - 5 hold - Seated Long Arc Quad  - 3-5 x daily - 7 x weekly - 1 sets - 5-10 reps - 2 hold - Supine Knee Extension Mobilization with Weight  - 4-5 x daily - 7 x weekly - 1 sets - 1 reps - to tolerance up to 5 mins hold - Seated Knee Flexion AAROM  - 2-3 x daily - 7 x weekly -  1 sets - 5 reps - 10-15 hold - Seated Quad Set  - 3-5 x daily - 7 x weekly - 1 sets - 10 reps - 5 hold - Seated SLR  - 1-2 x daily - 7 x weekly - 1-2 sets - 10-15 reps - 2 hold  ASSESSMENT:  CLINICAL IMPRESSION: Excellent improvement in flexion noted today and pt overall doing really well.  Still has some difficulty with functional strength including stairs.  Continue skilled PT.  OBJECTIVE IMPAIRMENTS: Abnormal gait, decreased activity tolerance, decreased balance, decreased coordination, decreased endurance, decreased mobility, difficulty walking, decreased ROM, decreased strength, hypomobility, increased edema, increased fascial restrictions, impaired perceived functional ability, increased muscle spasms, impaired flexibility, improper body mechanics, postural dysfunction, and pain.   ACTIVITY LIMITATIONS: carrying, lifting, bending, sitting, standing, squatting, sleeping, stairs, transfers, bed mobility, bathing, toileting, dressing, and locomotion level  PARTICIPATION LIMITATIONS: meal prep, cleaning, laundry, interpersonal relationship, driving, shopping, and community activity  PERSONAL FACTORS: Medical history: Anemia, aniexty, depression, arthritis, back pain, HTN, GERD, hyperlipidemia,  are also affecting patient's functional outcome.   REHAB POTENTIAL: Good  CLINICAL DECISION MAKING: Evolving/moderate complexity  EVALUATION COMPLEXITY: Moderate   GOALS: Goals reviewed with patient? Yes  SHORT TERM GOALS: (target date for Short term goals are 3 weeks 11/02/2023)   1.  Patient will demonstrate independent use of home exercise program to maintain progress from in clinic treatments.  Goal status: on going 10/22/2023  LONG TERM GOALS: (target dates for all long term goals are 10 weeks  12/21/2023 )   1. Patient will demonstrate/report pain at worst less than or equal to 2/10 to facilitate minimal limitation in daily activity secondary to pain symptoms.  Goal status: New   2.  Patient will demonstrate independent use of home exercise program to facilitate ability to maintain/progress functional gains from skilled physical therapy services.  Goal status: New   3. Patient will demonstrate Patient specific functional scale avg > or = 8/10 to indicate reduced disability due to condition.   Goal status: New   4.  Patient will demonstrate Rt LE MMT 5/5 throughout to faciltiate usual transfers, stairs, squatting at [facility] for daily life.   Goal status: New   5.  Patient will demonstrate Rt knee 0-110 deg AROM for transfers, mobility at PLOF in daily life.  Goal status: New   6.  Patient will demonstrate ascending/descending stairs reciprocally s UE assist for community integration.   Goal status: New   7.  Patient will demonstrate independent ambulation > 500 ft s deviation for community integration.  Goal Status: New   PLAN:  PT FREQUENCY: 1-2x/week  PT DURATION: 10 weeks  PLANNED INTERVENTIONS: Can include 02853- PT Re-evaluation, 97110-Therapeutic exercises, 97530- Therapeutic activity, [id number]- Neuromuscular re-education, 97535- Self Care, 97140- Manual therapy, [phone]- Gait training, [phone]- Orthotic Fit/training, [phone]- Canalith repositioning, [id number]- Aquatic Therapy, [phone]- Electrical stimulation (unattended), [id number] Physical performance testing, [id number]- Vasopneumatic device, [id number]- Ultrasound, [id number]- Traction (mechanical), [id number]- Ionotophoresis 4mg /ml Dexamethasone ,  [id number] - Needle insertion w/o injection 1 or 2 muscles, 20561 - Needle insertion w/o injection 3 or more muscles.   Patient/Family education, Balance training, Stair training, Taping, Dry Needling, Joint mobilization, Joint manipulation, Spinal manipulation, Spinal mobilization, Scar mobilization, Vestibular training, Visual/preceptual remediation/compensation, DME instructions, Cryotherapy, and Moist heat.  All performed as medically necessary.  All included unless contraindicated  PLAN FOR NEXT  SESSION:  continue progressive strengthening and balance improvement; try stairs  NEXT MD VISIT: 11/18/23   Corean JULIANNA Ku, PT, DPT 11/03/23 3:14 PM

## 2023-11-04 ENCOUNTER — Ambulatory Visit (INDEPENDENT_AMBULATORY_CARE_PROVIDER_SITE_OTHER)

## 2023-11-04 DIAGNOSIS — G8929 Other chronic pain: Secondary | ICD-10-CM

## 2023-11-04 DIAGNOSIS — M25561 Pain in right knee: Secondary | ICD-10-CM

## 2023-11-04 DIAGNOSIS — M25661 Stiffness of right knee, not elsewhere classified: Secondary | ICD-10-CM

## 2023-11-04 DIAGNOSIS — R600 Localized edema: Secondary | ICD-10-CM

## 2023-11-04 DIAGNOSIS — M6281 Muscle weakness (generalized): Secondary | ICD-10-CM

## 2023-11-04 DIAGNOSIS — R262 Difficulty in walking, not elsewhere classified: Secondary | ICD-10-CM

## 2023-11-04 NOTE — Therapy (Addendum)
 OUTPATIENT PHYSICAL THERAPY TREATMENT/DC   Patient Name: Angela Stark MRN: [id number] DOB:[birth year], 53 y.o., female Today's Date: 11/04/2023  END OF SESSION:  PT End of Session - 11/04/23 1236     Visit Number 6    Number of Visits 20    Date for PT Re-Evaluation 12/21/23    Authorization Type [facility] [employer] $25 copay    PT Start Time 1145    PT Stop Time 1225    PT Time Calculation (min) 40 min    Activity Tolerance Patient tolerated treatment well    Behavior During Therapy [facility] for tasks assessed/performed               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   Amenorrhea 05/28/2017   Anemia    Anxiety and depression 07/26/2006   Qualifier: Diagnosis of   By: Lorane Browning         Apnea 05/28/2017   ARF (acute renal failure) (HCC) 08/18/2011   Arthritis    Back pain    Bell's palsy 05/10/2023   Class 3 severe obesity due to excess calories with body mass index (BMI) of 40.0 to 44.9 in adult 08/27/2016   Dehydration 08/18/2011   Essential hypertension 07/30/2015   Fatty liver    GERD (gastroesophageal reflux disease)    Heart murmur    When pregnant   Hyperlipidemia    Hypertension    Hypokalemia 08/18/2011   Hyponatremia 08/18/2011   Joint pain    Knee pain    Muscle stiffness    Need for shingles vaccine 11/10/2021   Obstructive sleep apnea (adult) (pediatric) 05/10/2023   Other fatigue 10/18/2017   Palpitations 03/22/2023   Patellofemoral arthritis of left knee 09/05/2015   Phlebitis    Polysubstance dependence including opioid type drug, episodic abuse (HCC) 08/18/2011   Primary osteoarthritis of both knees 01/12/2018   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   Right ankle pain 09/05/2015   Right knee pain 10/19/2017   Right upper quadrant pain 07/30/2015   Screening for diabetes mellitus 11/30/2014    Shortness of breath on exertion    Sleep apnea    Steatosis of liver 09/06/2017   Stress    Swelling of both lower extremities    Thrombocytopenia (HCC) 08/18/2011   Thyroid  nodule 08/09/2020   Tobacco use disorder 04/16/2015   Chronic      Trouble in sleeping    Vision disturbance 04/16/2015   1/17 ?etiology     Past Surgical History:  Procedure Laterality Date   COLONOSCOPY     OVARY SURGERY     TOTAL KNEE ARTHROPLASTY Right 10/08/2023   Procedure: ARTHROPLASTY, KNEE, TOTAL;  Surgeon: Vernetta Lonni GRADE, MD;  Location: [facility];  Service: Orthopedics;  Laterality: Right;   WISDOM TOOTH EXTRACTION     Patient Active Problem List   Diagnosis Date Noted   Status post total right knee replacement 10/08/2023   Bell's palsy 05/10/2023   Obstructive sleep apnea (adult) (pediatric) 05/10/2023   Anemia    Arthritis    Back pain    GERD (gastroesophageal reflux disease)    Hyperlipidemia    Joint pain    Muscle stiffness    Stress    Swelling of both lower extremities    Trouble in sleeping    Morbid obesity with BMI of 40.0-44.9, adult (HCC) 03/22/2023  Palpitations 03/22/2023   Grieving 02/19/2022   Herpes zoster without complication 02/19/2022   Need for shingles vaccine 11/10/2021   Thyroid  nodule 08/09/2020   Heart murmur 08/09/2020   Phlebitis 11/03/2019   Elevated LDL cholesterol level 11/03/2019   Primary osteoarthritis of both knees 01/12/2018   Right knee pain 10/19/2017   Acute pain of left knee 10/19/2017   Other fatigue 10/18/2017   Shortness of breath on exertion 10/18/2017   Steatosis of liver 09/06/2017   Apnea 05/28/2017   Amenorrhea 05/28/2017   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   Class 3 severe obesity due to excess calories with body mass index (BMI) of 40.0 to 44.9 in adult 08/27/2016   Patellofemoral arthritis of left knee 09/05/2015   Right ankle pain 09/05/2015   Acquired supination of foot 09/05/2015   Essential hypertension 07/30/2015    Visual disturbance 07/30/2015   Right upper quadrant pain 07/30/2015   Vision disturbance 04/16/2015   Tobacco use disorder 04/16/2015   Screening for diabetes mellitus 11/30/2014   Encounter for screening mammogram for breast cancer 08/06/2014   Encounter for smoking cessation counseling 08/06/2014   Alcohol abuse 08/19/2011   Alcoholic hepatitis 08/18/2011   Dehydration 08/18/2011   ARF (acute renal failure) (HCC) 08/18/2011   Hypokalemia 08/18/2011   Hyponatremia 08/18/2011   Thrombocytopenia (HCC) 08/18/2011   Polysubstance dependence including opioid type drug, episodic abuse (HCC) 08/18/2011   Anxiety and depression 07/26/2006    PCP: Berneta Pouch MD  REFERRING PROVIDER: Vernetta Lonni GRADE, MD  REFERRING DIAG: [phone] (ICD-10-CM) - S/P total knee arthroplasty, right  THERAPY DIAG:  Chronic pain of right knee  Localized edema  Difficulty in walking, not elsewhere classified  Stiffness of right knee, not elsewhere classified  Muscle weakness (generalized)  Rationale for Evaluation and Treatment: Rehabilitation  ONSET DATE: Rt TKA 10/08/2023  SUBJECTIVE:   SUBJECTIVE STATEMENT: Pt reports she feels a little sore in rthe knee today, affecting walking.   PERTINENT HISTORY: Medical history: Anemia, aniexty, depression, arthritis, back pain, HTN, GERD, hyperlipidemia.  Has complaints of Lt knee.   PAIN:  NPRS scale: 1/10.  Pain location: Rt knee  Pain description: spasms, throbbing , spasms, tightness.  Aggravating factors: WB, prolonged standing activity, end ranges, nighttime, swelling.  Relieving factors: pain medicine, OTC medicine.   PRECAUTIONS: None  WEIGHT BEARING RESTRICTIONS: No  FALLS:  Has patient fallen in last 6 months? 1 fall in house.  No injury.  Was pre surgery.    Reported having a fear of falling.   LIVING ENVIRONMENT: Lives in: House/apartment Stairs: 2-3 steps, no rail.  Has following equipment at home: FWW Has 2 dog    OCCUPATION: [employer] - standing/walking, etc.   PLOF: Independent, cooking, fishing, garden.  Walking dogs.   PATIENT GOALS: Reduce pain, get mobile, take a bath. Back to work.   OBJECTIVE:   PATIENT SURVEYS:  Patient-Specific Activity Scoring Scheme  0 represents "unable to perform." 10 represents "able to perform at prior level. 0 1 2 3 4 5 6 7 8 9  10 (Date and Score)   Activity Eval  10/12/2023    1. Squatting  0    2. Getting on knees  0    3. Getting a bath 0   4. Stairs  0   5.    Score 0    Total score = sum of the activity scores/number of activities Minimum detectable change (90%CI) for average score = 2 points Minimum detectable change (90%CI) for  single activity score = 3 points  COGNITION: 10/12/2023 Overall cognitive status: WFL    SENSATION: 10/12/2023 Not specifically tested.   EDEMA:  10/12/2023 Localized edema noted in Rt knee, lower leg.   MUSCLE LENGTH: 10/12/2023 No specific testing today  POSTURE:  10/12/2023 Weight shift to Lt in standing.   PALPATION: 10/12/2023 General tenderness around knee.   LOWER EXTREMITY ROM:   ROM Right Eval 10/12/2023 Left Eval 10/12/2023 Right 10/19/2023  Right 11/03/23  Knee flexion 95 AROM in supine heel slide  100 AROM heel slide A: 116  AA: 118  Knee extension -6 AROM in seated LAQ  -3 PROM in supine heel prop      (Blank rows = not tested)  LOWER EXTREMITY MMT:  MMT Right Eval 10/12/2023 Left Eval 10/12/2023  Hip flexion 5/5 5/5  Hip extension    Hip abduction    Hip adduction    Hip internal rotation    Hip external rotation    Knee flexion 4/5   Knee extension 4/5   Ankle dorsiflexion 5/5 5/5  Ankle plantarflexion    Ankle inversion    Ankle eversion     (Blank rows = not tested)  LOWER EXTREMITY SPECIAL TESTS:  10/12/2023 No specific testing.   FUNCTIONAL TESTS:  10/12/2023 18 inch chair transfer: able to perform on 1st attempt with Lt leg dominant.     GAIT: 10/12/2023 Modified independent ambulation c FWW, reduced stance on Rt leg with reduced step length Lt leg.  Maintained knee flexion in stance lacking full TKE                                                                                                                                                                        TODAY'S TREATMENT 11/04/23 TherEx Nustep level 5 9 min Knee extension 10# RLE only 3x10 A heel slides 25x TherAct Leg Press bil 100# 3x10; then single leg performed bil 50# 3x10 TRX squats 3x10 - cues for midline awareness TRX curtsys 2x10  Neuro Re-ed Side stepping on foam x 5 laps without UE support Tandem walking on foam x 5 laps (fwd/bwd) with intermittent UE support SLS on Rt on foam 4x10 sec     11/03/23 TherEx Recumbent bike x 8 min; L4; seat 6 Knee extension 10# RLE only 3x10 ROM measurements - see above for details AA heel slides with ball and strap x 10 reps  TherAct Leg Press bil 100# 3x10; then single leg performed bil 50# 3x10 TRX squats 3x10 - cues for midline awareness  Neuro Re-ed Side stepping on foam x 5 laps without UE support Tandem walking on foam x 5 laps (fwd/bwd) with intermittent UE support SLS on Rt on foam with Lt taps  to Blaze pods, 30 sec on/30 sec rest x 3 cycles    10/22/2023 Therex: Recumbent bike lvl 2 8 mins  Seated Rt leg quad set 5 sec hold x 10 Seated Rt leg quad set with SLR x 15 (cues for home use. )   TherActivity Driving replication with blazepod red/green reactive light with 1.75 second time out with random sequencing 30 sec x 5 Leg press double leg 87 lbs x 15, single leg 31 lbs 2 x 15 bilateral   Neuro Re-ed Tandem stance on foam 1 min x 2 bilateral with occasional HHA on bars.  Tandem ambulation on ground in // bars 10 ft x 5 each way with occasional HHA Standing feet together across foam bar 1 min balance ankle strategy control SLS with contralateral leg tapping corners of black mat x 8  each, performed bilaterally    10/19/2023 Therex: UBE UE/LE for knee ROM lvl 2.5 8 mins Seated Rt leg LAQ with end range pauses both directions with contralateral leg movement opposite 3 lbs 2 x 15 Supine Rt knee heel slide AROM 5 sec hold, quad set 5 sec hold combo x 10  Supine Rt leg quad set with SLR x 15 Supine heel prop Rt with start of vaso for extension gains to tolerance x5    Gait Training(to improve quality and mechanisms of gait) Step to pattern over 6 inch hurdle in // bars with occasional to moderate HHA with focus on knee flexion clearing step.  10 ft x x 5 leading with each LE first Step over/back 4 inch step with anterior/posterior weight shift to improve swing phase and weight shift control x 10 bilateral in // bars with occasional HHA  Manual Rt knee flexion c distraction IR mobilization with movement for flexion gains with contralateral leg movement opposite.   Vaso 10 mins medium compression Rt knee in elevation 34 deg with TKE stretch at start    10/14/2023 Therex:  Nustep 8 min level 3   Supine heel slides 2x10 with 3 sec hold at the end range Supine quad sets 20x with 5 sec hold Seated marching 20x2 LAQ 20x HEP review  Neuro Re ed: Step ups on 4 inch step  2x10 Lean in for flexion- 6 inch step- 10 sec x10  Standing on airex with perturbations for balance   Vaso 38 degrees 10 min     PATIENT EDUCATION:  10/22/2023 Education details: HEP update Person educated: Patient Education method: Programmer, Multimedia, Demonstration, Verbal cues, and Handouts Education comprehension: verbalized understanding, returned demonstration, and verbal cues required  HOME EXERCISE PROGRAM: Access Code: [email or web address] URL: [email or web address] Date: 10/22/2023 Prepared by: Ozell Silvan  Exercises - Supine Heel Slide  - 3-5 x daily - 7 x weekly - 1 sets - 10 reps - 5 hold - Supine Heel Slide with Strap (Mirrored)  - 3-5 x daily - 7 x weekly - 1 sets - 10 reps -  5 hold - Seated Long Arc Quad  - 3-5 x daily - 7 x weekly - 1 sets - 5-10 reps - 2 hold - Supine Knee Extension Mobilization with Weight  - 4-5 x daily - 7 x weekly - 1 sets - 1 reps - to tolerance up to 5 mins hold - Seated Knee Flexion AAROM  - 2-3 x daily - 7 x weekly - 1 sets - 5 reps - 10-15 hold - Seated Quad Set  - 3-5 x daily - 7 x weekly - 1 sets - 10 reps -  5 hold - Seated SLR  - 1-2 x daily - 7 x weekly - 1-2 sets - 10-15 reps - 2 hold  ASSESSMENT:  CLINICAL IMPRESSION: Pt needed assist with SLS on foam due to inc sway.  No assist needed with foam walking.  OBJECTIVE IMPAIRMENTS: Abnormal gait, decreased activity tolerance, decreased balance, decreased coordination, decreased endurance, decreased mobility, difficulty walking, decreased ROM, decreased strength, hypomobility, increased edema, increased fascial restrictions, impaired perceived functional ability, increased muscle spasms, impaired flexibility, improper body mechanics, postural dysfunction, and pain.   ACTIVITY LIMITATIONS: carrying, lifting, bending, sitting, standing, squatting, sleeping, stairs, transfers, bed mobility, bathing, toileting, dressing, and locomotion level  PARTICIPATION LIMITATIONS: meal prep, cleaning, laundry, interpersonal relationship, driving, shopping, and community activity  PERSONAL FACTORS: Medical history: Anemia, aniexty, depression, arthritis, back pain, HTN, GERD, hyperlipidemia,  are also affecting patient's functional outcome.   REHAB POTENTIAL: Good  CLINICAL DECISION MAKING: Evolving/moderate complexity  EVALUATION COMPLEXITY: Moderate   GOALS: Goals reviewed with patient? Yes  SHORT TERM GOALS: (target date for Short term goals are 3 weeks 11/02/2023)   1.  Patient will demonstrate independent use of home exercise program to maintain progress from in clinic treatments.  Goal status: on going 10/22/2023  LONG TERM GOALS: (target dates for all long term goals are 10 weeks   12/21/2023 )   1. Patient will demonstrate/report pain at worst less than or equal to 2/10 to facilitate minimal limitation in daily activity secondary to pain symptoms.  Goal status: New   2. Patient will demonstrate independent use of home exercise program to facilitate ability to maintain/progress functional gains from skilled physical therapy services.  Goal status: New   3. Patient will demonstrate Patient specific functional scale avg > or = 8/10 to indicate reduced disability due to condition.   Goal status: New   4.  Patient will demonstrate Rt LE MMT 5/5 throughout to faciltiate usual transfers, stairs, squatting at [facility] for daily life.   Goal status: New   5.  Patient will demonstrate Rt knee 0-110 deg AROM for transfers, mobility at PLOF in daily life.  Goal status: New   6.  Patient will demonstrate ascending/descending stairs reciprocally s UE assist for community integration.   Goal status: New   7.  Patient will demonstrate independent ambulation > 500 ft s deviation for community integration.  Goal Status: New   PLAN:  PT FREQUENCY: 1-2x/week  PT DURATION: 10 weeks  PLANNED INTERVENTIONS: Can include 02853- PT Re-evaluation, 97110-Therapeutic exercises, 97530- Therapeutic activity, [id number]- Neuromuscular re-education, 97535- Self Care, 97140- Manual therapy, [phone]- Gait training, [phone]- Orthotic Fit/training, [phone]- Canalith repositioning, [id number]- Aquatic Therapy, [phone]- Electrical stimulation (unattended), [id number] Physical performance testing, 97016- Vasopneumatic device, [id number]- Ultrasound, [id number]- Traction (mechanical), [id number]- Ionotophoresis 4mg /ml Dexamethasone ,  79439 - Needle insertion w/o injection 1 or 2 muscles, 20561 - Needle insertion w/o injection 3 or more muscles.   Patient/Family education, Balance training, Stair training, Taping, Dry Needling, Joint mobilization, Joint manipulation, Spinal manipulation, Spinal mobilization, Scar mobilization, Vestibular  training, Visual/preceptual remediation/compensation, DME instructions, Cryotherapy, and Moist heat.  All performed as medically necessary.  All included unless contraindicated  PLAN FOR NEXT SESSION:  continue progressive strengthening and balance improvement; stairs  NEXT MD VISIT: 11/18/23  PHYSICAL THERAPY DISCHARGE SUMMARY  Visits from Start of Care: 6  Current functional level related to goals / functional outcomes: See above   Remaining deficits: See above   Education / Equipment: HEP   Patient agrees to discharge. Patient goals were partially  met. Patient is being discharged due to not returning since the last visit.     Burnard Meth, PT 11/04/23  12:38 PM

## 2023-11-10 ENCOUNTER — Other Ambulatory Visit: Payer: Self-pay | Admitting: Orthopaedic Surgery

## 2023-11-11 ENCOUNTER — Other Ambulatory Visit: Payer: Self-pay

## 2023-11-11 ENCOUNTER — Other Ambulatory Visit (HOSPITAL_COMMUNITY): Payer: Self-pay

## 2023-11-11 MED ORDER — OXYCODONE HCL 5 MG PO TABS
5.0000 mg | ORAL_TABLET | Freq: Four times a day (QID) | ORAL | 0 refills | Status: DC | PRN
  Filled 2023-11-11: qty 30, 4d supply, fill #0

## 2023-11-12 ENCOUNTER — Other Ambulatory Visit (HOSPITAL_COMMUNITY): Payer: Self-pay

## 2023-11-12 ENCOUNTER — Encounter: Payer: Self-pay | Admitting: Physical Therapy

## 2023-11-18 ENCOUNTER — Other Ambulatory Visit: Payer: Self-pay

## 2023-11-18 ENCOUNTER — Other Ambulatory Visit (HOSPITAL_COMMUNITY): Payer: Self-pay

## 2023-11-18 ENCOUNTER — Ambulatory Visit (INDEPENDENT_AMBULATORY_CARE_PROVIDER_SITE_OTHER): Admitting: Orthopaedic Surgery

## 2023-11-18 ENCOUNTER — Encounter: Payer: Self-pay | Admitting: Orthopaedic Surgery

## 2023-11-18 DIAGNOSIS — Z96651 Presence of right artificial knee joint: Secondary | ICD-10-CM

## 2023-11-18 MED ORDER — GABAPENTIN 300 MG PO CAPS
300.0000 mg | ORAL_CAPSULE | Freq: Every evening | ORAL | 1 refills | Status: AC | PRN
Start: 2023-11-18 — End: ?
  Filled 2023-11-18: qty 30, 30d supply, fill #0
  Filled 2023-12-21: qty 30, 30d supply, fill #1

## 2023-11-18 MED ORDER — TIZANIDINE HCL 4 MG PO TABS
4.0000 mg | ORAL_TABLET | Freq: Four times a day (QID) | ORAL | 2 refills | Status: AC | PRN
  Filled 2023-11-18: qty 30, 8d supply, fill #0
  Filled 2023-12-21: qty 30, 8d supply, fill #1
  Filled 2024-03-08: qty 30, 8d supply, fill #2

## 2023-11-18 NOTE — Progress Notes (Signed)
 The patient is here today at about 6 weeks status post a right total knee arthroplasty to treat significant right knee arthritis.  She is only 53 years old.  She has been going to outpatient physical therapy on a regular basis.  The last notes from physical therapy show progress with her right knee range of motion.  She is incredibly motivated and doing well with therapy and her range of motion.  She is driving as well.  She is taking tizanidine  and gabapentin  mainly just at night to help her sleep.  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  Examination of her right knee today shows only mild swelling.  Her extension is full and her flexion is really great as well.  Her calf is soft.  The knee feels ligamentously stable.  I did refill her medications.  The next time we need to see her is not for 3 months unless there are issues.  Will have a standing AP and lateral of her right operative knee at that visit.  I did give her a work turn to work note starting September 21.

## 2023-11-19 ENCOUNTER — Encounter: Admitting: Physical Therapy

## 2023-11-23 ENCOUNTER — Encounter: Payer: Self-pay | Admitting: Family Medicine

## 2023-11-23 DIAGNOSIS — G4733 Obstructive sleep apnea (adult) (pediatric): Secondary | ICD-10-CM

## 2023-11-24 ENCOUNTER — Other Ambulatory Visit: Payer: Self-pay

## 2023-11-24 ENCOUNTER — Other Ambulatory Visit: Payer: Self-pay | Admitting: Orthopaedic Surgery

## 2023-11-24 ENCOUNTER — Other Ambulatory Visit (HOSPITAL_COMMUNITY): Payer: Self-pay

## 2023-11-24 ENCOUNTER — Encounter: Admitting: Rehabilitative and Restorative Service Providers"

## 2023-11-24 MED ORDER — OXYCODONE HCL 5 MG PO TABS
5.0000 mg | ORAL_TABLET | Freq: Four times a day (QID) | ORAL | 0 refills | Status: DC | PRN
  Filled 2023-11-24: qty 30, 4d supply, fill #0

## 2023-11-25 ENCOUNTER — Other Ambulatory Visit (HOSPITAL_COMMUNITY): Payer: Self-pay

## 2023-11-25 MED ORDER — TIRZEPATIDE-WEIGHT MANAGEMENT 10 MG/0.5ML ~~LOC~~ SOAJ
10.0000 mg | SUBCUTANEOUS | 3 refills | Status: DC
  Filled 2023-11-25: qty 2, 28d supply, fill #0

## 2023-12-02 ENCOUNTER — Encounter: Payer: Self-pay | Admitting: Family Medicine

## 2023-12-03 ENCOUNTER — Other Ambulatory Visit: Payer: Self-pay

## 2023-12-03 DIAGNOSIS — G4733 Obstructive sleep apnea (adult) (pediatric): Secondary | ICD-10-CM

## 2023-12-03 MED ORDER — TIRZEPATIDE-WEIGHT MANAGEMENT 10 MG/0.5ML ~~LOC~~ SOLN: 0.5000 mg | SUBCUTANEOUS | 3 refills | Status: DC

## 2023-12-03 MED ORDER — TIRZEPATIDE-WEIGHT MANAGEMENT 10 MG/0.5ML ~~LOC~~ SOAJ: 10.0000 mg | SUBCUTANEOUS | 3 refills | Status: DC

## 2023-12-21 ENCOUNTER — Other Ambulatory Visit (HOSPITAL_COMMUNITY): Payer: Self-pay

## 2023-12-21 ENCOUNTER — Other Ambulatory Visit: Payer: Self-pay

## 2023-12-21 ENCOUNTER — Other Ambulatory Visit: Payer: Self-pay | Admitting: Orthopaedic Surgery

## 2023-12-21 ENCOUNTER — Other Ambulatory Visit: Payer: Self-pay | Admitting: Family Medicine

## 2023-12-21 DIAGNOSIS — I10 Essential (primary) hypertension: Secondary | ICD-10-CM

## 2023-12-21 MED ORDER — OXYCODONE HCL 5 MG PO TABS
5.0000 mg | ORAL_TABLET | Freq: Four times a day (QID) | ORAL | 0 refills | Status: AC | PRN
  Filled 2023-12-21 – 2024-01-01 (×2): qty 30, 4d supply, fill #0

## 2023-12-21 MED ORDER — CHLORTHALIDONE 25 MG PO TABS
25.0000 mg | ORAL_TABLET | Freq: Every day | ORAL | 0 refills | Status: DC
  Filled 2023-12-21: qty 90, 90d supply, fill #0
  Filled 2024-03-08: qty 90, 90d supply, fill #1

## 2023-12-24 ENCOUNTER — Other Ambulatory Visit (HOSPITAL_COMMUNITY): Payer: Self-pay

## 2024-01-02 ENCOUNTER — Other Ambulatory Visit (HOSPITAL_COMMUNITY): Payer: Self-pay

## 2024-01-19 ENCOUNTER — Other Ambulatory Visit: Payer: Self-pay

## 2024-01-19 ENCOUNTER — Other Ambulatory Visit (HOSPITAL_COMMUNITY): Payer: Self-pay

## 2024-01-19 MED ORDER — TIRZEPATIDE-WEIGHT MANAGEMENT 10 MG/0.5ML ~~LOC~~ SOLN
0.5000 mg | SUBCUTANEOUS | 0 refills | Status: DC
Start: 2024-01-19 — End: 2024-01-19
  Filled 2024-01-19: qty 0.5, 140d supply, fill #0

## 2024-01-19 MED ORDER — TIRZEPATIDE-WEIGHT MANAGEMENT 10 MG/0.5ML ~~LOC~~ SOLN
0.5000 mg | SUBCUTANEOUS | 0 refills | Status: DC
Start: 2024-01-19 — End: 2024-01-21

## 2024-01-19 MED ORDER — TIRZEPATIDE-WEIGHT MANAGEMENT 10 MG/0.5ML ~~LOC~~ SOLN
0.5000 mg | SUBCUTANEOUS | 3 refills | Status: DC
Start: 2024-01-19 — End: 2024-01-19

## 2024-01-19 NOTE — Telephone Encounter (Addendum)
 Noted. I have checked [facility] and the date of her flu shot is not listed there either. 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

## 2024-01-21 ENCOUNTER — Telehealth: Payer: Self-pay | Admitting: Family Medicine

## 2024-01-21 ENCOUNTER — Other Ambulatory Visit: Payer: Self-pay

## 2024-01-21 MED ORDER — TIRZEPATIDE-WEIGHT MANAGEMENT 10 MG/0.5ML ~~LOC~~ SOLN: 0.5000 mg | SUBCUTANEOUS | 0 refills | Status: DC

## 2024-01-21 NOTE — Telephone Encounter (Signed)
 Copied from CRM #[id number]. Topic: Clinical - Prescription Issue >> Jan 21, 2024  9:02 AM [name] wrote: Reason for CRM: Angela Stark called in regards to a medication denial for Zepbound  due to errors in signature note and quantity.   Prescription was rejected because it has a wrong signature note and it needs to state inject 0.40ml into the skin once a week.  The quantity is invalid. It states 0 milliliters. The quantity should state 2 milliliters.  Please send over a new prescription with corrections.   Fax number: [phone]

## 2024-01-24 ENCOUNTER — Encounter: Payer: Self-pay | Admitting: Radiology

## 2024-02-02 ENCOUNTER — Telehealth: Payer: Self-pay | Admitting: Orthopaedic Surgery

## 2024-02-02 IMAGING — MR MR KNEE*R* W/O CM
4 of 7 series · 22 of 40 positions shown · non-contrast
Comparison: X-ray 12/23/2017

CLINICAL DATA: Chronic right knee pain

EXAM:
MRI OF THE RIGHT KNEE WITHOUT CONTRAST
TECHNIQUE: Multiplanar, multisequence MR imaging of the knee was performed. No
intravenous contrast was administered.

[Series 4: T2 fat-sat · coronal · 4.0mm · 0.59mm/px · 5 of 24 slices shown (1 of 2)]
[im 1/24]
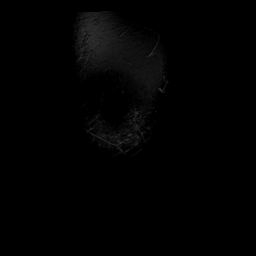
[im 6/24]
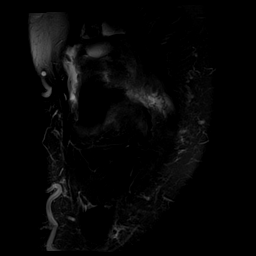
[im 12/24]
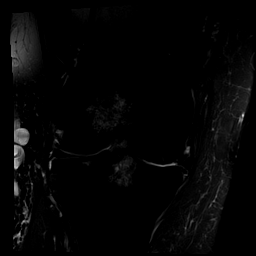
[im 18/24]
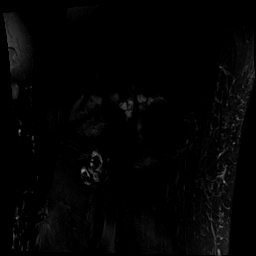
[im 24/24]
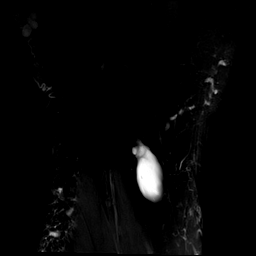

[Series 5: T1 · coronal · 4.0mm · 0.29mm/px · 3 of 24 slices shown]
[im 5/24]
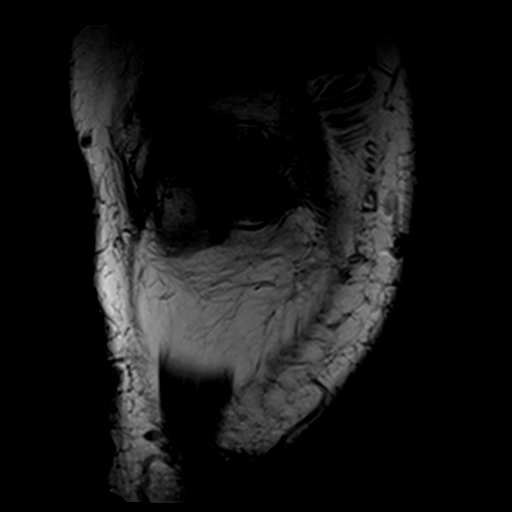
[im 14/24]
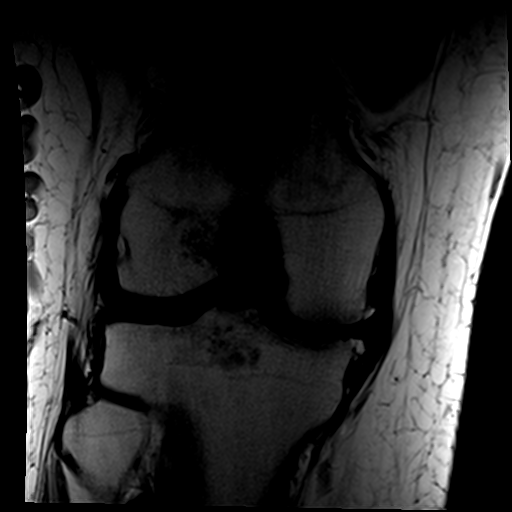
[im 24/24]
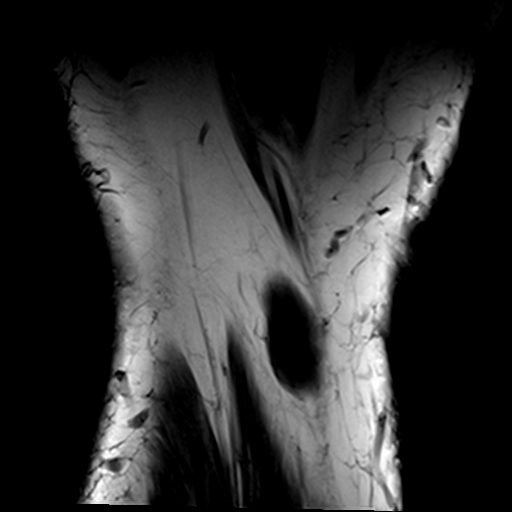

[Series 7: T2 fat-sat · sagittal · 3.0mm · 0.29mm/px · 7 of 27 slices shown (2 of 2)]
[im 1/27]
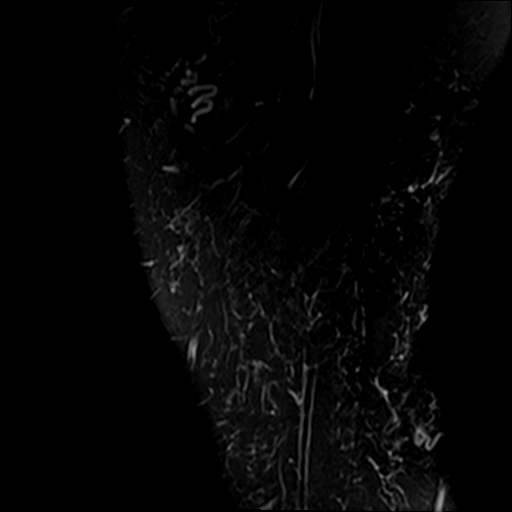
[im 5/27]
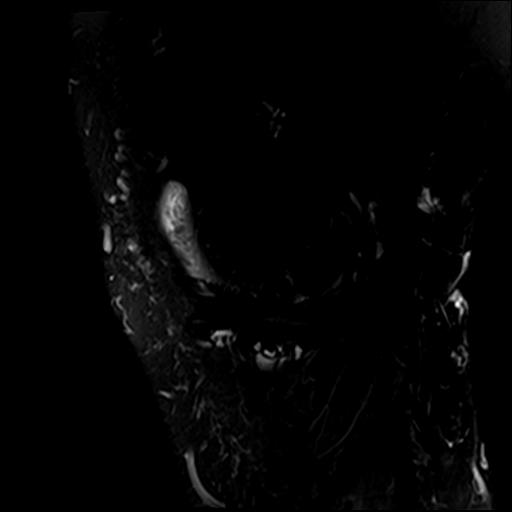
[im 9/27]
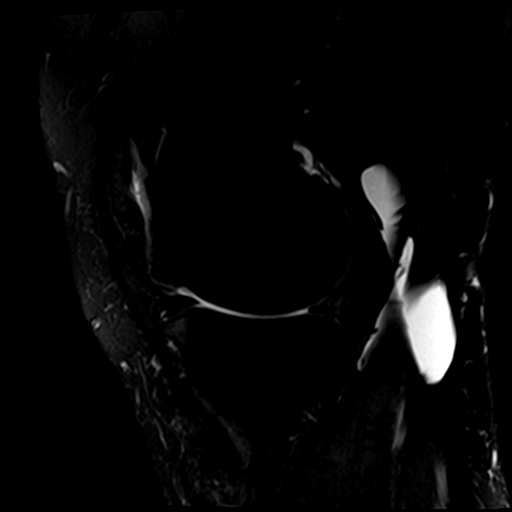
[im 14/27]
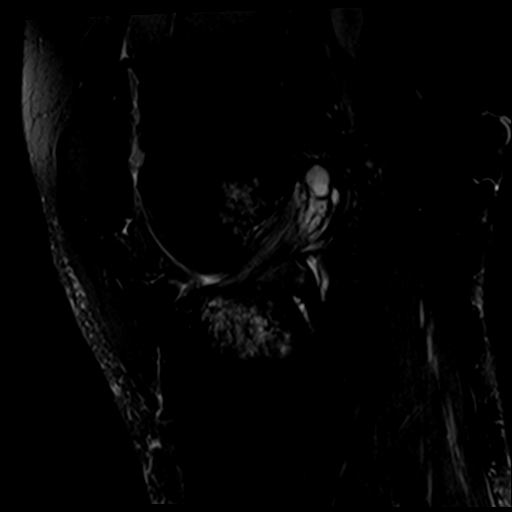
[im 18/27]
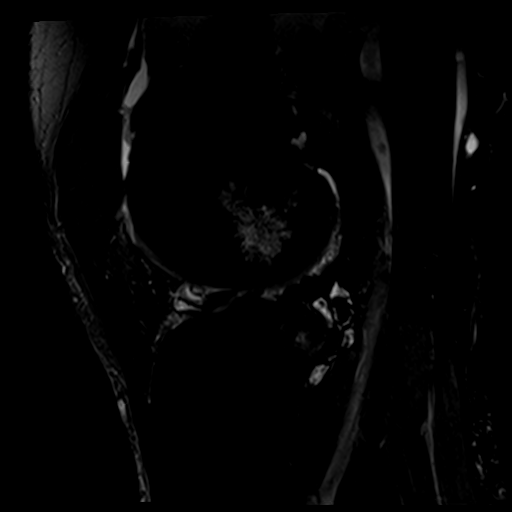
[im 22/27]
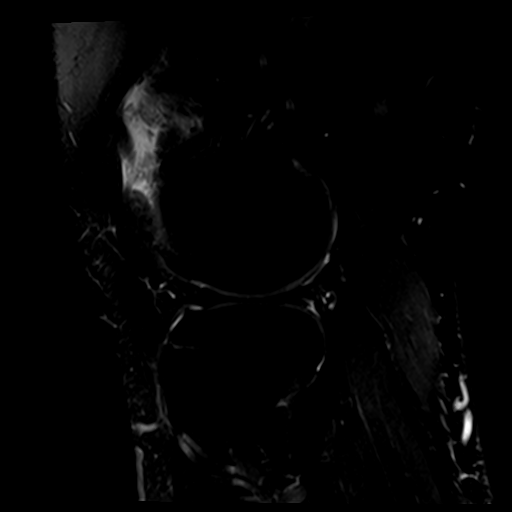
[im 27/27]
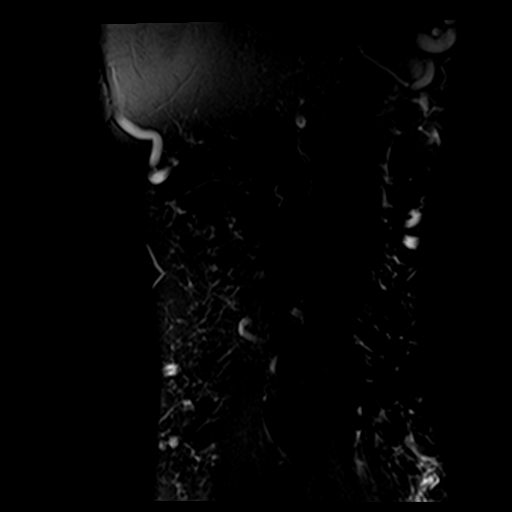

[Series 8: PD fat-sat · sagittal · 3.0mm · 0.29mm/px · 7 of 27 slices shown]
[im 1/27]
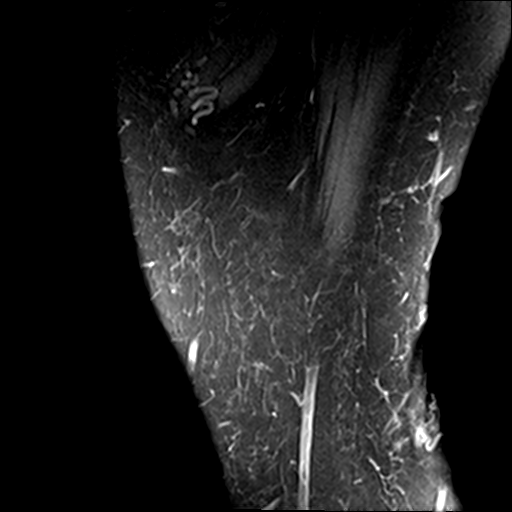
[im 5/27]
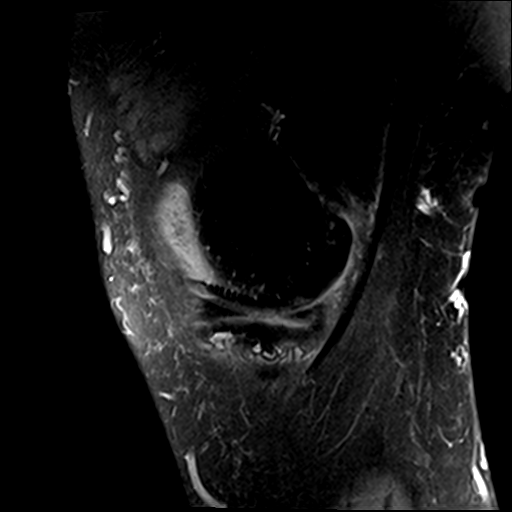
[im 9/27]
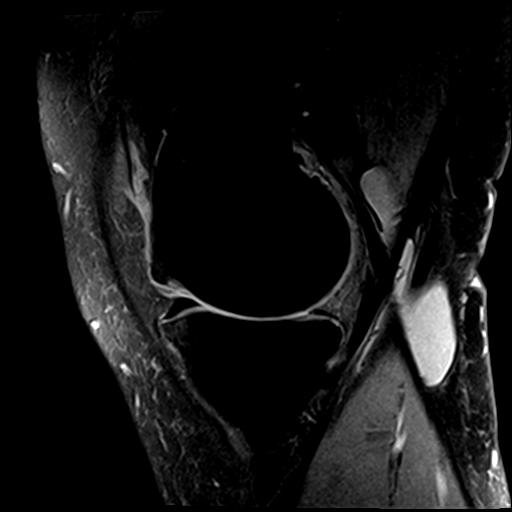
[im 14/27]
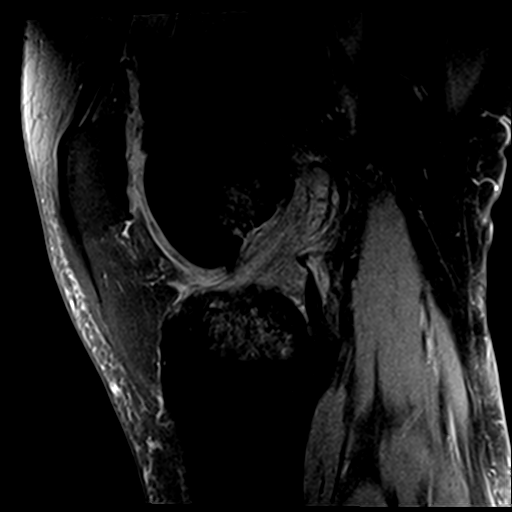
[im 18/27]
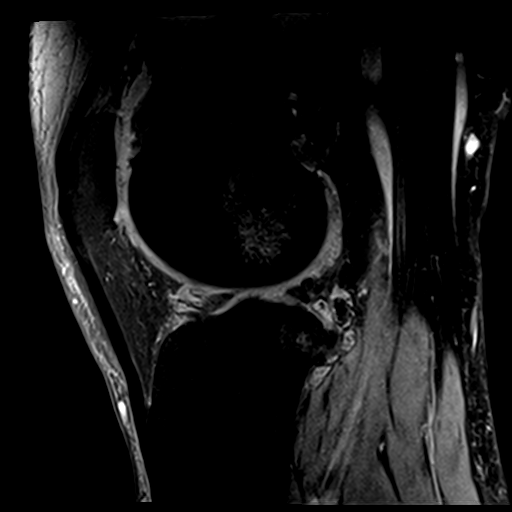
[im 22/27]
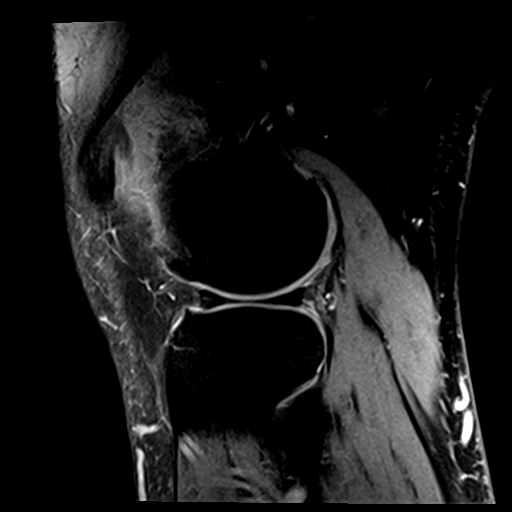
[im 27/27]
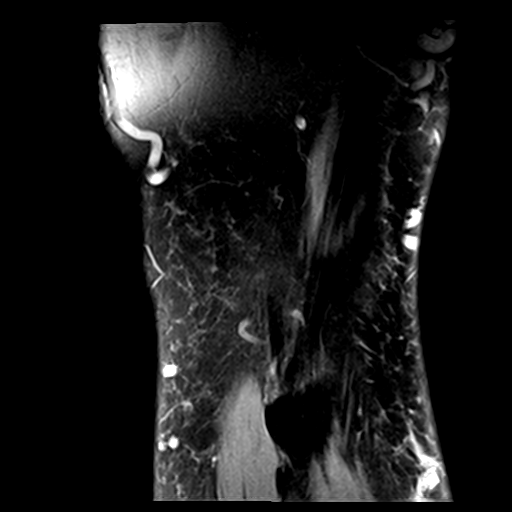

[22 of 40 positions shown; findings below may reference images not displayed]

FINDINGS: MENISCI

Medial meniscus: Complex degenerative tearing of the medial meniscal
posterior horn. Free edge fraying of the midbody.

Lateral meniscus: Advanced intrasubstance degeneration of the
lateral meniscal anterior horn with superficial tearing and small
parameniscal cyst formation.

LIGAMENTS

Cruciates: Marked mucoid degeneration of the ACL. Numerous ganglion
cysts along the posterior margin of the ACL proximally. There is
also mucoid degeneration of the posterior cruciate ligament.

Collaterals: Intact MCL. Lateral collateral ligament complex intact.

CARTILAGE

Patellofemoral: Mild-moderate chondral thinning throughout the
patellofemoral compartment.

Medial: Moderate-advanced cartilage loss throughout the
weight-bearing medial compartment.

Lateral:  Mild lateral compartment cartilage thinning.

MISCELLANEOUS

Joint: Small joint effusion containing numerous small
intra-articular loose bodies. Fat pads within normal limits.

Popliteal Fossa: Moderate-sized Baker's cyst. Intact popliteus
tendon.

Extensor Mechanism:  Intact quadriceps and patellar tendons.

Bones: No acute fracture. Tricompartmental joint space narrowing
with marginal osteophyte formation. Reactive subcortical marrow
signal changes within the proximal tibia and distal femur at the ACL
attachment sites.

Other: Superficial venous varicosities noted laterally.
IMPRESSION: Right knee:

1. Tricompartmental osteoarthritis, most advanced within the medial
compartment.
2. Complex degenerative tearing of the medial meniscal posterior
horn.
3. Advanced intrasubstance degeneration of the lateral meniscal
anterior horn with superficial tearing and small parameniscal cyst
formation.
4. Marked mucoid degeneration of the ACL.
5. Small joint effusion containing numerous small intra-articular
loose bodies, which may represent synovial chondromatosis.
6. Moderate-sized Baker's cyst.

## 2024-02-02 NOTE — Telephone Encounter (Signed)
 Patient called and is having some work done on her teeth. She needs the antibiotics called into the [facility] long outpatient pharmacy. CB#[phone]

## 2024-02-16 ENCOUNTER — Ambulatory Visit: Admitting: Orthopaedic Surgery

## 2024-02-16 ENCOUNTER — Other Ambulatory Visit (INDEPENDENT_AMBULATORY_CARE_PROVIDER_SITE_OTHER): Payer: Self-pay

## 2024-02-16 DIAGNOSIS — Z96651 Presence of right artificial knee joint: Secondary | ICD-10-CM

## 2024-02-16 DIAGNOSIS — M25552 Pain in left hip: Secondary | ICD-10-CM

## 2024-02-16 DIAGNOSIS — M25551 Pain in right hip: Secondary | ICD-10-CM

## 2024-02-16 NOTE — Progress Notes (Signed)
 The patient is well-known to us .  We replaced her right knee back in July of this year secondary to severe end-stage arthritis.  She is only 53 years old.  We did this with press-fit implants.  She said the right knee is doing great for her.  She still denies any pain with her left knee in spite of its arthritis.  She has been dealing with bilateral hip pain over the trochanteric area both hips.  On exam both hips move fluidly and smoothly with no pain in the groin at all.  Her right operative knee has full range of motion and feels stable.  The left knee also has full range of motion with some patellofemoral crepitation.  An AP pelvis and lateral both hips does show moderate arthritic changes right hip with superior lateral joint space narrowing.  There are no cortical irregularities of either trochanteric area.  The hip left hip shows some slight narrowing.  In AP and lateral the right knee shows a well-seated press-fit total knee arthroplasty with no complicating features.  There is arthritic changes on the AP view of the left knee that is moderate.  She asked about the potential for seeing a chiropractor for her back and her hips and I think this is absolutely reasonable and if she needs a referral she will let us  know.  From my standpoint we will see her back in 6 months with a repeat AP and lateral of her right press-fit total knee arthroplasty.  If she does have worsening issues with her hips and would like to consider even injections or being seen sooner she knows to reach out and let us  know.

## 2024-03-07 DIAGNOSIS — G4733 Obstructive sleep apnea (adult) (pediatric): Secondary | ICD-10-CM | POA: Diagnosis not present

## 2024-03-08 ENCOUNTER — Other Ambulatory Visit (HOSPITAL_COMMUNITY): Payer: Self-pay

## 2024-03-08 ENCOUNTER — Other Ambulatory Visit: Payer: Self-pay

## 2024-03-08 ENCOUNTER — Other Ambulatory Visit: Payer: Self-pay | Admitting: Family Medicine

## 2024-03-08 DIAGNOSIS — G4733 Obstructive sleep apnea (adult) (pediatric): Secondary | ICD-10-CM

## 2024-03-08 DIAGNOSIS — I10 Essential (primary) hypertension: Secondary | ICD-10-CM

## 2024-03-08 MED ORDER — CHLORTHALIDONE 25 MG PO TABS
25.0000 mg | ORAL_TABLET | Freq: Every day | ORAL | 0 refills | Status: DC
  Filled 2024-03-08: qty 30, 30d supply, fill #0

## 2024-03-08 NOTE — Telephone Encounter (Signed)
 Patient overdue for an office appointment. Tried calling and it goes to voicemail. I sent a MyChart message to patient

## 2024-03-13 ENCOUNTER — Other Ambulatory Visit (HOSPITAL_COMMUNITY): Payer: Self-pay

## 2024-03-13 MED ORDER — TIRZEPATIDE-WEIGHT MANAGEMENT 10 MG/0.5ML ~~LOC~~ SOLN: 0.5000 mg | SUBCUTANEOUS | 5 refills | Status: DC

## 2024-03-13 MED ORDER — TIRZEPATIDE-WEIGHT MANAGEMENT 10 MG/0.5ML ~~LOC~~ SOLN
0.5000 mg | SUBCUTANEOUS | 5 refills | Status: DC
  Filled 2024-03-13: qty 2, fill #0

## 2024-03-13 NOTE — Addendum Note (Signed)
 Addended by: BERNETA ELSIE LABOR on: 03/13/2024 08:20 AM   Modules accepted: Orders

## 2024-03-14 ENCOUNTER — Other Ambulatory Visit (HOSPITAL_COMMUNITY): Payer: Self-pay

## 2024-03-14 MED ORDER — CHLORTHALIDONE 25 MG PO TABS
25.0000 mg | ORAL_TABLET | Freq: Every day | ORAL | 0 refills | Status: AC
  Filled 2024-03-14: qty 30, 30d supply, fill #0

## 2024-03-14 MED ORDER — TIRZEPATIDE-WEIGHT MANAGEMENT 10 MG/0.5ML ~~LOC~~ SOLN: 10.0000 mg | SUBCUTANEOUS | 5 refills | Status: AC

## 2024-03-14 NOTE — Addendum Note (Signed)
 Addended by: BERNETA ELSIE LABOR on: 03/14/2024 12:15 PM   Modules accepted: Orders

## 2024-03-14 NOTE — Telephone Encounter (Signed)
 Called [employer] and spoke with Cassandra and informed her why I was calling. She stated that the directions are not correct. That if the vial is 10 mg and the directions are to inject 0.5 mg into skin weekly is not correct. She stated that the directions should state to inject 0.5 mL into skin weekly. Informed her that I will pass this along to the prescribing physician to review.

## 2024-03-14 NOTE — Addendum Note (Signed)
 Addended by: BERNETA ELSIE LABOR on: 03/14/2024 12:14 PM   Modules accepted: Orders

## 2024-08-16 ENCOUNTER — Ambulatory Visit: Admitting: Orthopaedic Surgery
